# Patient Record
Sex: Male | Born: 1950
Health system: Southern US, Community
[De-identification: ages and names within clinical notes are randomized; demographics above are authoritative.]

## PROBLEM LIST (undated history)

## (undated) DIAGNOSIS — I350 Nonrheumatic aortic (valve) stenosis: Secondary | ICD-10-CM

## (undated) DIAGNOSIS — K2270 Barrett's esophagus without dysplasia: Secondary | ICD-10-CM

## (undated) DIAGNOSIS — I739 Peripheral vascular disease, unspecified: Secondary | ICD-10-CM

## (undated) DIAGNOSIS — K219 Gastro-esophageal reflux disease without esophagitis: Secondary | ICD-10-CM

## (undated) DIAGNOSIS — Z8719 Personal history of other diseases of the digestive system: Secondary | ICD-10-CM

## (undated) DIAGNOSIS — Z953 Presence of xenogenic heart valve: Secondary | ICD-10-CM

## (undated) DIAGNOSIS — Z9861 Coronary angioplasty status: Secondary | ICD-10-CM

## (undated) DIAGNOSIS — M329 Systemic lupus erythematosus, unspecified: Secondary | ICD-10-CM

## (undated) DIAGNOSIS — J189 Pneumonia, unspecified organism: Secondary | ICD-10-CM

## (undated) DIAGNOSIS — J984 Other disorders of lung: Secondary | ICD-10-CM

## (undated) DIAGNOSIS — I10 Essential (primary) hypertension: Secondary | ICD-10-CM

## (undated) DIAGNOSIS — IMO0002 Reserved for concepts with insufficient information to code with codable children: Secondary | ICD-10-CM

## (undated) DIAGNOSIS — I25709 Atherosclerosis of coronary artery bypass graft(s), unspecified, with unspecified angina pectoris: Secondary | ICD-10-CM

## (undated) DIAGNOSIS — Z951 Presence of aortocoronary bypass graft: Secondary | ICD-10-CM

## (undated) DIAGNOSIS — I2510 Atherosclerotic heart disease of native coronary artery without angina pectoris: Secondary | ICD-10-CM

## (undated) DIAGNOSIS — E785 Hyperlipidemia, unspecified: Secondary | ICD-10-CM

## (undated) DIAGNOSIS — Z974 Presence of external hearing-aid: Secondary | ICD-10-CM

## (undated) DIAGNOSIS — D649 Anemia, unspecified: Secondary | ICD-10-CM

## (undated) DIAGNOSIS — H409 Unspecified glaucoma: Secondary | ICD-10-CM

## (undated) DIAGNOSIS — D6859 Other primary thrombophilia: Secondary | ICD-10-CM

## (undated) HISTORY — DX: Barrett's esophagus without dysplasia: K22.70

## (undated) HISTORY — PX: TONSILLECTOMY AND ADENOIDECTOMY: SUR1326

## (undated) HISTORY — DX: Other disorders of lung: J98.4

## (undated) HISTORY — PX: EYE SURGERY: SHX253

## (undated) HISTORY — DX: Pneumonia, unspecified organism: J18.9

## (undated) HISTORY — DX: Presence of external hearing-aid: Z97.4

## (undated) HISTORY — DX: Reserved for concepts with insufficient information to code with codable children: IMO0002

## (undated) HISTORY — PX: KNEE SURGERY: SHX244

## (undated) HISTORY — DX: Systemic lupus erythematosus, unspecified: M32.9

## (undated) HISTORY — DX: Gastro-esophageal reflux disease without esophagitis: K21.9

## (undated) HISTORY — DX: Hyperlipidemia, unspecified: E78.5

## (undated) HISTORY — DX: Unspecified glaucoma: H40.9

## (undated) HISTORY — PX: CATARACT EXTRACTION: SUR2

## (undated) HISTORY — DX: Other primary thrombophilia: D68.59

## (undated) HISTORY — PX: UPPER GASTROINTESTINAL ENDOSCOPY: SHX188

## (undated) HISTORY — PX: COLONOSCOPY: SHX174

## (undated) HISTORY — DX: Nonrheumatic aortic (valve) stenosis: I35.0

## (undated) HISTORY — PX: NASAL SINUS SURGERY: SHX719

---

## 2014-01-14 ENCOUNTER — Encounter: Payer: Self-pay | Admitting: Family

## 2014-01-14 ENCOUNTER — Ambulatory Visit (INDEPENDENT_AMBULATORY_CARE_PROVIDER_SITE_OTHER): Payer: Medicare Other | Admitting: Family

## 2014-01-14 VITALS — BP 140/84 | HR 81 | Ht 68.5 in | Wt 165.0 lb

## 2014-01-14 DIAGNOSIS — M1990 Unspecified osteoarthritis, unspecified site: Secondary | ICD-10-CM

## 2014-01-14 DIAGNOSIS — K219 Gastro-esophageal reflux disease without esophagitis: Secondary | ICD-10-CM | POA: Insufficient documentation

## 2014-01-14 DIAGNOSIS — I10 Essential (primary) hypertension: Secondary | ICD-10-CM

## 2014-01-14 DIAGNOSIS — D6859 Other primary thrombophilia: Secondary | ICD-10-CM

## 2014-01-14 DIAGNOSIS — D6862 Lupus anticoagulant syndrome: Secondary | ICD-10-CM | POA: Insufficient documentation

## 2014-01-14 DIAGNOSIS — Z9861 Coronary angioplasty status: Secondary | ICD-10-CM

## 2014-01-14 DIAGNOSIS — R0602 Shortness of breath: Secondary | ICD-10-CM

## 2014-01-14 DIAGNOSIS — E7800 Pure hypercholesterolemia, unspecified: Secondary | ICD-10-CM

## 2014-01-14 DIAGNOSIS — H409 Unspecified glaucoma: Secondary | ICD-10-CM

## 2014-01-14 DIAGNOSIS — I2510 Atherosclerotic heart disease of native coronary artery without angina pectoris: Secondary | ICD-10-CM | POA: Insufficient documentation

## 2014-01-14 DIAGNOSIS — M2550 Pain in unspecified joint: Secondary | ICD-10-CM

## 2014-01-14 DIAGNOSIS — J309 Allergic rhinitis, unspecified: Secondary | ICD-10-CM | POA: Insufficient documentation

## 2014-01-14 DIAGNOSIS — E785 Hyperlipidemia, unspecified: Secondary | ICD-10-CM | POA: Insufficient documentation

## 2014-01-14 LAB — POCT INR: INR: 3.3

## 2014-01-14 NOTE — Progress Notes (Signed)
Pre visit review using our clinic review tool, if applicable. No additional management support is needed unless otherwise documented below in the visit note. 

## 2014-01-14 NOTE — Progress Notes (Signed)
Subjective:    Patient ID: [name], male    DOB: October 20, 1950, 63 y.o.   MRN: [id number]  HPI 63 year old WM, new patient is in to be established. He has a history of lupus anticoagulant syndrome, hypercholesterolemia, coronary artery disease, multiple joint pain, osteoarthritis, glaucoma, GERD, and allergic rhinitis. He relocated here from [facility]. We'll need to reestablish with cardiology, pulmonology, rheumatology, and ophthalmology. He is currently on Coumadin for lupus hypercoagulability. Takes 4 mg of Coumadin on Tuesdays and Thursdays and 5 mg all other days. Last labs were approximately 1 month ago and requests that we get his medical records from previous PCP. Has a history of colon polyps last colonoscopy 5 years ago. Reports polyps being benign. No active complaints. Will be on a cruise from April 11th-May 12th.    Review of Systems  Constitutional: Negative.   HENT: Negative.   Respiratory: Negative.   Cardiovascular: Negative.   Gastrointestinal: Negative.   Endocrine: Negative.   Genitourinary: Negative.   Musculoskeletal: Negative.   Skin: Negative.   Neurological: Negative.   Hematological: Negative.   Psychiatric/Behavioral: Negative.    Past Medical History  Diagnosis Date  . GERD (gastroesophageal reflux disease)   . Glaucoma   . Hypertension   . Hyperlipidemia   . Heart disease   . Lupus   . Heart murmur   . Pneumonia     History   Social History  . Marital Status: Married    Spouse Name: N/A    Number of Children: N/A  . Years of Education: N/A   Occupational History  . Not on file.   Social History Main Topics  . Smoking status: Never Smoker   . Smokeless tobacco: Not on file  . Alcohol Use: Yes  . Drug Use: No  . Sexual Activity: Not on file   Other Topics Concern  . Not on file   Social History Narrative  . No narrative on file    Past Surgical History  Procedure Laterality Date  . Tonsillectomy and adenoidectomy     . Knee surgery      bilateral  . Cataract extraction      x 2    Family History  Problem Relation Age of Onset  . Diabetes Mother   . Hypertension Mother   . Arthritis Mother   . Diabetes Father   . Hypertension Father   . Arthritis Father     Not on File  No current outpatient prescriptions on file prior to visit.   No current facility-administered medications on file prior to visit.    BP 140/84  Pulse 81  Ht 5' 8.5" (1.74 m)  Wt 165 lb (74.844 kg)  BMI 24.72 kg/m2  SpO2 98%chart    Objective:   Physical Exam  Constitutional: He is oriented to person, place, and time. He appears well-developed and well-nourished.  HENT:  Head: Normocephalic.  Right Ear: External ear normal.  Left Ear: External ear normal.  Nose: Nose normal.  Mouth/Throat: Oropharynx is clear and moist.  Neck: Normal range of motion. Neck supple.  Cardiovascular: Normal rate, regular rhythm and normal heart sounds.   Pulmonary/Chest: Effort normal and breath sounds normal.  Abdominal: Soft. Bowel sounds are normal.  Musculoskeletal: Normal range of motion.  Neurological: He is alert and oriented to person, place, and time.  Skin: Skin is warm and dry.  Psychiatric: He has a normal mood and affect.  Assessment & Plan:  [name] was seen today for establish care.  Diagnoses and associated orders for this visit:  Lupus anticoagulant with hypercoagulable state - Ambulatory referral to Pulmonology - Ambulatory referral to Rheumatology  CAD (coronary artery disease) - Ambulatory referral to Cardiology  Multiple joint pain  Osteoarthritis  Glaucoma - Ambulatory referral to Ophthalmology  GERD (gastroesophageal reflux disease)  Allergic rhinitis  Shortness of breath - Ambulatory referral to Pulmonology  Other Orders - POCT INR   Obtain medical records from previous PCP.

## 2014-01-14 NOTE — Patient Instructions (Signed)
Take 1/2 tablet today only. Then continue 4 mg on Tues and Thursday. All other days 5mg .   Anticoagulation Dose Instructions as of 01/14/2014     Glynis SmilesSun Mon Tue Wed Thu Fri Sat   New Dose 5 mg 5 mg 4 mg 5 mg 4 mg 5 mg 5 mg    Description       Take 1/2 tablet today only. Then continue 4 mg on Tues and Thursday. All other days 5mg .

## 2014-01-18 ENCOUNTER — Telehealth: Payer: Self-pay | Admitting: Family

## 2014-01-18 ENCOUNTER — Other Ambulatory Visit: Payer: Self-pay

## 2014-01-18 DIAGNOSIS — I2510 Atherosclerotic heart disease of native coronary artery without angina pectoris: Secondary | ICD-10-CM

## 2014-01-18 DIAGNOSIS — I10 Essential (primary) hypertension: Secondary | ICD-10-CM

## 2014-01-18 MED ORDER — WARFARIN SODIUM 5 MG PO TABS: 5.0000 mg | ORAL_TABLET | Freq: Every day | ORAL | Status: DC

## 2014-01-18 NOTE — Telephone Encounter (Signed)
Relevant patient education assigned to patient using Emmi. ° °

## 2014-01-19 ENCOUNTER — Telehealth: Payer: Self-pay | Admitting: Family

## 2014-01-19 NOTE — Telephone Encounter (Signed)
Dr[facility] Corliss SkainsEVESHWAR[facility] to [facility] due to scheduling.  The doctor request pt to be referred to another rheumatologist.

## 2014-01-19 NOTE — Telephone Encounter (Signed)
Can referral be sent to another rheumatologist without referral being placed again?

## 2014-01-19 NOTE — Telephone Encounter (Signed)
Yes i will send to Dr Kellie Simmeringruslow

## 2014-01-19 NOTE — Telephone Encounter (Signed)
Thank you :)

## 2014-01-20 ENCOUNTER — Ambulatory Visit (INDEPENDENT_AMBULATORY_CARE_PROVIDER_SITE_OTHER): Payer: Medicare Other | Admitting: Pulmonary Disease

## 2014-01-20 ENCOUNTER — Encounter: Payer: Self-pay | Admitting: Pulmonary Disease

## 2014-01-20 VITALS — BP 142/82 | HR 68 | Ht 68.5 in | Wt 166.0 lb

## 2014-01-20 DIAGNOSIS — R0609 Other forms of dyspnea: Secondary | ICD-10-CM

## 2014-01-20 DIAGNOSIS — D6859 Other primary thrombophilia: Secondary | ICD-10-CM

## 2014-01-20 DIAGNOSIS — R0600 Dyspnea, unspecified: Secondary | ICD-10-CM | POA: Insufficient documentation

## 2014-01-20 DIAGNOSIS — J99 Respiratory disorders in diseases classified elsewhere: Secondary | ICD-10-CM

## 2014-01-20 DIAGNOSIS — D6862 Lupus anticoagulant syndrome: Secondary | ICD-10-CM

## 2014-01-20 DIAGNOSIS — J189 Pneumonia, unspecified organism: Secondary | ICD-10-CM

## 2014-01-20 DIAGNOSIS — M329 Systemic lupus erythematosus, unspecified: Secondary | ICD-10-CM

## 2014-01-20 DIAGNOSIS — R0989 Other specified symptoms and signs involving the circulatory and respiratory systems: Secondary | ICD-10-CM

## 2014-01-20 DIAGNOSIS — M3213 Lung involvement in systemic lupus erythematosus: Secondary | ICD-10-CM

## 2014-01-20 NOTE — Assessment & Plan Note (Signed)
At this point I think that this is more likely to be a cardiac problem than a pulmonary problem considering his normal lung exam and oximetry.  Further, his cardiac history is much more complicated than his documented pulmonary problems.  Plan: -PFT -establish care with cardiology

## 2014-01-20 NOTE — Assessment & Plan Note (Addendum)
Apparently he has been told that he has lupus pneumonitis but it has been quiescent for years and has never flared.  It's not clear to me how this diagnosis was made as he never really recalls having pulmonary symptoms.   Fortunately the problem does not appear to be worsening.  Plan: -[facility] -check Full PFT -CXR -if PFT abnormal, check CT chest -continue plaquenil -f/u 6 months

## 2014-01-20 NOTE — Patient Instructions (Signed)
Continue taking your Plaquenil as you are doing We will arrange a lung function test and a Chest X-ray I recommend that you [facility] for [facility] Rheumatology visit, I can give a referral if you need it We will see you back in 6 months or sooner if needed

## 2014-01-20 NOTE — Progress Notes (Signed)
Subjective:    Patient ID: [name], male    DOB: Jun 16, 1951, 63 y.o.   MRN: [id number]  HPI  This is a very pleasant 63 year old male who comes her clinic today to establish care for lupus-related pneumonitis. He was diagnosed with lupus over 10 years ago [place] [address] [place] He has been followed by a rheumatologist in his previous hometown as well as [facility].  Lupus has caused significant skin changes and joint aches over the years. He has not had significant kidney damage. He has been told that the lining of his lung and his alveoli are thicker than they should be because of the lupus. However despite this he has never been hospitalized and he has never had a flare of worsening shortness of breath or cough. He says that he does get some shortness of breath when he exerts himself though he tries to stay as active as possible. He actually lifts weights on a regular basis and sometimes has some shortness of breath with this. He also does cardiovascular activity when he works out such as walking on a treadmill. He will have mild shortness of breath with this but it is not too bad. He says that overall he thinks the shortness of breath has perhaps worsened somewhat over the last 10 years but it has not been a rapid decline. Because of his multiple cardiac events in the past (he has had several cardiac stents) he is unsure of his shortness of breath has been due to his heart problems are his lung problems.  [facility] for this problem in the past. His lung function testing has been stable over the last several years according to their notes. Previously he took prednisone for many years but currently he has been managed on plaque. He says that he has taken plaque now alone for the last 2 years.  Past Medical History  Diagnosis Date  . GERD (gastroesophageal reflux disease)   . Glaucoma   . Hypertension   . Hyperlipidemia   . Heart disease    . Lupus   . Heart murmur   . Pneumonia      Family History  Problem Relation Age of Onset  . Diabetes Mother   . Hypertension Mother   . Arthritis Mother   . Diabetes Father   . Hypertension Father   . Arthritis Father      History   Social History  . Marital Status: Married    Spouse Name: N/A    Number of Children: N/A  . Years of Education: N/A   Occupational History  . Not on file.   Social History Main Topics  . Smoking status: Never Smoker   . Smokeless tobacco: Never Used  . Alcohol Use: 3.5 oz/week    7 drink(s) per week  . Drug Use: No  . Sexual Activity: Not on file   Other Topics Concern  . Not on file   Social History Narrative  . No narrative on file     Allergies  Allergen Reactions  . Sulfa Antibiotics     Makes patient jittery, prefers not to take them.      Outpatient Prescriptions Prior to Visit  Medication Sig Dispense Refill  . CRESTOR 5 MG tablet Take 5 mg by mouth daily.       . hydroxychloroquine (PLAQUENIL) 200 MG tablet Take 400 mg by mouth daily.       Marland Kitchen. NASONEX  50 MCG/ACT nasal spray Place 2 sprays into the nose daily.       Marland Kitchen omeprazole (PRILOSEC) 40 MG capsule Take 40 mg by mouth daily.       . ramipril (ALTACE) 5 MG capsule Take 5 mg by mouth daily.       Marland Kitchen RANEXA 500 MG 12 hr tablet Take 500 mg by mouth 2 (two) times daily.       Marland Kitchen warfarin (COUMADIN) 4 MG tablet Take 4 mg by mouth daily. Tuesdays and Thursdays      . warfarin (COUMADIN) 5 MG tablet Take 1 tablet (5 mg total) by mouth daily.  30 tablet  0   No facility-administered medications prior to visit.      Review of Systems  Constitutional: Negative for fever and unexpected weight change.  HENT: Positive for ear pain. Negative for congestion, dental problem, nosebleeds, postnasal drip, rhinorrhea, sinus pressure, sneezing, sore throat and trouble swallowing.   Eyes: Negative for redness and itching.  Respiratory: Positive for shortness of breath. Negative for  cough, chest tightness and wheezing.   Cardiovascular: Positive for palpitations. Negative for leg swelling.  Gastrointestinal: Negative for nausea and vomiting.  Genitourinary: Negative for dysuria.  Musculoskeletal: Positive for joint swelling.  Skin: Negative for rash.  Neurological: Negative for headaches.  Hematological: Does not bruise/bleed easily.  Psychiatric/Behavioral: Negative for dysphoric mood. The patient is not nervous/anxious.        Objective:   Physical Exam  Filed Vitals:   01/20/14 1431  BP: 142/82  Pulse: 68  Height: 5' 8.5" (1.74 m)  Weight: 166 lb (75.297 kg)  SpO2: 99%   Gen: well appearing, no acute distress HEENT: NCAT, PERRL, EOMi, OP clear, neck supple without masses PULM: Few crackles in bases, otherwise clear CV: RRR, two systolic murmurs noted RUSB and apex, no JVD AB: BS+, soft, nontender, no hsm Ext: warm, no edema, no clubbing, no cyanosis Derm: multiple skin lesions throughout forearms, nose Neuro: A&Ox4, CN II-XII intact, strength 5/5 in all 4 extremities  Notes from [facility] reviewed: -2014 spirometry: Ratio 70%, FVC 3.47 L (76% pred), FEV1 2.4L (71% pred) -2015 Echo > LVEF 50-59%, normal size and functioning LV; moderate AS, mild AI, mild MR, RV normal size and function, RA mildly dilated, no pericardial effusion    Assessment & Plan:   Lupus anticoagulant with hypercoagulable state I don't have record of his lab work to confirm antiphospolipid antibody syndrome. However, I think it is very reasonable to continue to treat him as such since he had PE in the past and was told that his "blood work said it was from Lupus".  His new Rheumatologist may want to consider re-testing, if there is value in that this many years into treatment.  Lung involvement in systemic lupus erythematosus Apparently he has been told that he has lupus pneumonitis but it has been quiescent for years and has never flared.  It's not clear to me how this diagnosis was  made as he never really recalls having pulmonary symptoms.   Fortunately the problem does not appear to be worsening.  Plan: -obtain records from [facility] -check Full PFT -CXR -if PFT abnormal, check CT chest -continue plaquenil -f/u 6 months  Dyspnea At this point I think that this is more likely to be a cardiac problem than a pulmonary problem considering his normal lung exam and oximetry.  Further, his cardiac history is much more complicated than his documented pulmonary problems.  Plan: -PFT -establish care  with cardiology    Updated Medication List Outpatient Encounter Prescriptions as of 01/20/2014  Medication Sig  . CRESTOR 5 MG tablet Take 5 mg by mouth daily.   . hydroxychloroquine (PLAQUENIL) 200 MG tablet Take 400 mg by mouth daily.   Marland Kitchen NASONEX 50 MCG/ACT nasal spray Place 2 sprays into the nose daily.   Marland Kitchen omeprazole (PRILOSEC) 40 MG capsule Take 40 mg by mouth daily.   . ramipril (ALTACE) 5 MG capsule Take 5 mg by mouth daily.   Marland Kitchen RANEXA 500 MG 12 hr tablet Take 500 mg by mouth 2 (two) times daily.   Marland Kitchen warfarin (COUMADIN) 4 MG tablet Take 4 mg by mouth daily. Tuesdays and Thursdays  . warfarin (COUMADIN) 5 MG tablet Take 5 mg by mouth daily. M,W,F,S,S  . [DISCONTINUED] warfarin (COUMADIN) 5 MG tablet Take 1 tablet (5 mg total) by mouth daily.

## 2014-01-20 NOTE — Assessment & Plan Note (Signed)
I don't have record of his lab work to confirm antiphospolipid antibody syndrome. However, I think it is very reasonable to continue to treat him as such since he had PE in the past and was told that his "blood work said it was from Lupus".  His new Rheumatologist may want to consider re-testing, if there is value in that this many years into treatment.

## 2014-01-27 ENCOUNTER — Other Ambulatory Visit (HOSPITAL_COMMUNITY): Payer: Medicare Other

## 2014-02-02 ENCOUNTER — Ambulatory Visit: Payer: Medicare Other | Admitting: Cardiology

## 2014-02-24 ENCOUNTER — Ambulatory Visit (INDEPENDENT_AMBULATORY_CARE_PROVIDER_SITE_OTHER): Payer: Medicare Other | Admitting: Family

## 2014-02-24 DIAGNOSIS — D6862 Lupus anticoagulant syndrome: Secondary | ICD-10-CM

## 2014-02-24 DIAGNOSIS — D6859 Other primary thrombophilia: Secondary | ICD-10-CM

## 2014-02-24 LAB — POCT INR: INR: 2.8

## 2014-02-24 MED ORDER — WARFARIN SODIUM 5 MG PO TABS: 5.0000 mg | ORAL_TABLET | Freq: Every day | ORAL | Status: DC

## 2014-02-24 NOTE — Patient Instructions (Signed)
Continue 4 mg on Tues and Thursday. All other days 5mg . Recheck in 4 weeks.   Anticoagulation Dose Instructions as of 02/24/2014     Kenneth SmilesSun Mon Tue Wed Thu Fri Sat   New Dose 5 mg 5 mg 4 mg 5 mg 4 mg 5 mg 5 mg    Description       Continue 4 mg on Tues and Thursday. All other days 5mg . Recheck in 4 weeks.

## 2014-03-01 ENCOUNTER — Other Ambulatory Visit: Payer: Self-pay

## 2014-03-01 ENCOUNTER — Ambulatory Visit (HOSPITAL_COMMUNITY): Payer: Medicare Other | Attending: Cardiovascular Disease | Admitting: Cardiology

## 2014-03-01 DIAGNOSIS — I2510 Atherosclerotic heart disease of native coronary artery without angina pectoris: Secondary | ICD-10-CM

## 2014-03-01 DIAGNOSIS — R0602 Shortness of breath: Secondary | ICD-10-CM

## 2014-03-01 DIAGNOSIS — I4891 Unspecified atrial fibrillation: Secondary | ICD-10-CM

## 2014-03-01 NOTE — Progress Notes (Signed)
Echo performed. 

## 2014-03-03 ENCOUNTER — Ambulatory Visit (INDEPENDENT_AMBULATORY_CARE_PROVIDER_SITE_OTHER): Payer: Medicare Other | Admitting: Cardiology

## 2014-03-03 ENCOUNTER — Encounter: Payer: Self-pay | Admitting: Cardiology

## 2014-03-03 VITALS — BP 138/70 | HR 68 | Ht 68.5 in | Wt 166.3 lb

## 2014-03-03 DIAGNOSIS — R0989 Other specified symptoms and signs involving the circulatory and respiratory systems: Secondary | ICD-10-CM

## 2014-03-03 DIAGNOSIS — I2510 Atherosclerotic heart disease of native coronary artery without angina pectoris: Secondary | ICD-10-CM

## 2014-03-03 DIAGNOSIS — E7800 Pure hypercholesterolemia, unspecified: Secondary | ICD-10-CM

## 2014-03-03 DIAGNOSIS — R5383 Other fatigue: Secondary | ICD-10-CM

## 2014-03-03 DIAGNOSIS — R0609 Other forms of dyspnea: Secondary | ICD-10-CM

## 2014-03-03 DIAGNOSIS — Z79899 Other long term (current) drug therapy: Secondary | ICD-10-CM

## 2014-03-03 DIAGNOSIS — Z131 Encounter for screening for diabetes mellitus: Secondary | ICD-10-CM

## 2014-03-03 DIAGNOSIS — R0600 Dyspnea, unspecified: Secondary | ICD-10-CM

## 2014-03-03 DIAGNOSIS — R5381 Other malaise: Secondary | ICD-10-CM

## 2014-03-03 NOTE — Progress Notes (Signed)
HPI The patient presents as a new patient. He has an extensive history of coronary disease.  He is being evaluated for dyspnea. He does have some lung disease felt to be related to his lupus. He has an extensive history of coronary disease and does report 7 stents in the past. I do have some of these records but not the most recent catheterization. I do see a stress perfusion study from January done at another institution. There was not any evidence of ischemia or infarct. He's had a well preserved ejection fraction. He did have an echo recently demonstrated well-preserved ejection fraction and mild aortic stenosis. He actually has done well. He says that any dyspnea he has is really baseline. He does exercise routinely and was able to do about 100 minutes yesterday at exercise without bringing on any of his usual angina. He's not having any chest pressure, neck or arm discomfort. He's not having any palpitations, presyncope or syncope. He has no PND or orthopnea. He is having pulmonary workup pulmonary function testing as scheduled.   Allergies  Allergen Reactions  . Sulfa Antibiotics     Makes patient jittery, prefers not to take them.     Current Outpatient Prescriptions  Medication Sig Dispense Refill  . aspirin EC 325 MG tablet Take 0.5 tablets by mouth daily.      . CRESTOR 5 MG tablet Take 5 mg by mouth daily.       . hydroxychloroquine (PLAQUENIL) 200 MG tablet Take 400 mg by mouth daily.       . metoprolol succinate (TOPROL-XL) 25 MG 24 hr tablet Take 0.5 tablets by mouth 2 (two) times daily.      Marland Kitchen. NASONEX 50 MCG/ACT nasal spray Place 2 sprays into the nose daily.       Marland Kitchen. omeprazole (PRILOSEC) 40 MG capsule Take 40 mg by mouth daily.       . ramipril (ALTACE) 5 MG capsule Take 5 mg by mouth daily.       Marland Kitchen. RANEXA 500 MG 12 hr tablet Take 500 mg by mouth 2 (two) times daily.       Marland Kitchen. warfarin (COUMADIN) 4 MG tablet Take 4 mg by mouth daily. Tuesdays and Thursdays      . warfarin  (COUMADIN) 5 MG tablet Take 1 tablet (5 mg total) by mouth daily. M,W,F,S,S  90 tablet  0   No current facility-administered medications for this visit.    Past Medical History  Diagnosis Date  . GERD (gastroesophageal reflux disease)   . Glaucoma   . Hypertension   . Hyperlipidemia   . Heart disease   . Lupus   . Heart murmur   . Pneumonia     Past Surgical History  Procedure Laterality Date  . Tonsillectomy and adenoidectomy    . Knee surgery      bilateral  . Cataract extraction      x 2    Family History  Problem Relation Age of Onset  . Diabetes Mother   . Hypertension Mother   . Arthritis Mother   . Diabetes Father   . Hypertension Father   . Arthritis Father     History   Social History  . Marital Status: Married    Spouse Name: N/A    Number of Children: N/A  . Years of Education: N/A   Occupational History  . Not on file.   Social History Main Topics  . Smoking status: Never Smoker   .  Smokeless tobacco: Never Used  . Alcohol Use: 3.5 oz/week    7 drink(s) per week  . Drug Use: No  . Sexual Activity: Not on file   Other Topics Concern  . Not on file   Social History Narrative  . No narrative on file    ROS:  Decreased hearing, reflux. Otherwise as stated in the HPI and negative for all other systems.  PHYSICAL EXAM BP 138/70  Pulse 68  Ht 5' 8.5" (1.74 m)  Wt 166 lb 4.8 oz (75.433 kg)  BMI 24.92 kg/m2 GENERAL:  Well appearing HEENT:  Pupils equal round and reactive, fundi not visualized, oral mucosa unremarkable NECK:  No jugular venous distention, waveform within normal limits, carotid upstroke brisk and symmetric, possible left bruit, no thyromegaly LYMPHATICS:  No cervical, inguinal adenopathy LUNGS:  Clear to auscultation bilaterally BACK:  No CVA tenderness CHEST:  Unremarkable HEART:  PMI not displaced or sustained,S1 and S2 within normal limits, no S3, no S4, no clicks, no rubs, apical systolic murmur at the apex and  slightly radiating, no diasotlic murmurs ABD:  Flat, positive bowel sounds normal in frequency in pitch, no bruits, no rebound, no guarding, no midline pulsatile mass, no hepatomegaly, no splenomegaly EXT:  2 plus pulses left radial, decreased right radial, no edema, no cyanosis no clubbing, discoloration on the legs.   SKIN:  No rashes no nodules NEURO:  Cranial nerves II through XII grossly intact, motor grossly intact throughout PSYCH:  Cognitively intact, oriented to person place and time  EKG:  Sinus rhythm, rate 68, axis within normal limits, intervals within normal limits, no acute ST-T wave changes.  03/03/2014   ASSESSMENT AND PLAN  CAD:  The patient had a negative stress perfusion study in January. If symptoms. I will obtain his most recent cardiac catheterization. However, for now he needs no new cardiovascular testing but he does need continued risk reduction.  HTN:  His blood pressure is controlled. We will continue the meds as listed.  DYSLIPIDEMIA:  I will check a lipid profile and manage accordingly.  BRUIT:  I will check carotid Dopplers. This is likely a transmitted murmur however.  AS:  This is mild. No change in therapy is indicated. We will follow this clinically.  HYPERCOAGUABLE:  He will follow up with his primary provider for his INRs.  Review of previous records.   (Greater than 40 minutes reviewing all data with greater than 50% face to face with the patient).

## 2014-03-03 NOTE — Patient Instructions (Signed)
The current medical regimen is effective;  continue present plan and medications.  Please return fasting for blood work.  The [facility] lab is on the first floor of this building.  (CMP, TSH, CBC, HA1C and Lipid panel)  Your physician has requested that you have a carotid duplex. This test is an ultrasound of the carotid arteries in your neck. It looks at blood flow through these arteries that supply the brain with blood. Allow one hour for this exam. There are no restrictions or special instructions.  Follow up with Dr Antoine PocheHochrein in 3 months.

## 2014-03-05 ENCOUNTER — Other Ambulatory Visit: Payer: Self-pay | Admitting: *Deleted

## 2014-03-05 ENCOUNTER — Ambulatory Visit (HOSPITAL_COMMUNITY)
Admission: RE | Admit: 2014-03-05 | Discharge: 2014-03-05 | Disposition: A | Discharge status: Home / Self Care | Payer: Medicare Other | Source: Ambulatory Visit | Attending: Cardiovascular Disease | Admitting: Cardiovascular Disease

## 2014-03-05 DIAGNOSIS — R0989 Other specified symptoms and signs involving the circulatory and respiratory systems: Secondary | ICD-10-CM

## 2014-03-05 DIAGNOSIS — I2510 Atherosclerotic heart disease of native coronary artery without angina pectoris: Secondary | ICD-10-CM

## 2014-03-05 DIAGNOSIS — R5383 Other fatigue: Secondary | ICD-10-CM

## 2014-03-05 DIAGNOSIS — E7800 Pure hypercholesterolemia, unspecified: Secondary | ICD-10-CM

## 2014-03-05 DIAGNOSIS — Z131 Encounter for screening for diabetes mellitus: Secondary | ICD-10-CM

## 2014-03-05 DIAGNOSIS — R0600 Dyspnea, unspecified: Secondary | ICD-10-CM

## 2014-03-05 DIAGNOSIS — Z79899 Other long term (current) drug therapy: Secondary | ICD-10-CM

## 2014-03-05 LAB — CBC
HCT: 37.4 % — ABNORMAL LOW (ref 39.0–52.0)
Hemoglobin: 12.3 g/dL — ABNORMAL LOW (ref 13.0–17.0)
MCH: 26.2 pg (ref 26.0–34.0)
MCHC: 32.9 g/dL (ref 30.0–36.0)
MCV: 79.7 fL (ref 78.0–100.0)
PLATELETS: 195 10*3/uL (ref 150–400)
RBC: 4.69 MIL/uL (ref 4.22–5.81)
RDW: 16.4 % — ABNORMAL HIGH (ref 11.5–15.5)
WBC: 4.6 10*3/uL (ref 4.0–10.5)

## 2014-03-05 LAB — LIPID PANEL
Cholesterol: 85 mg/dL (ref 0–200)
HDL: 25 mg/dL — AB (ref >39)
LDL Cholesterol: 34 mg/dL (ref 0–99)
TRIGLYCERIDES: 131 mg/dL (ref <150)
Total CHOL/HDL Ratio: 3.4 Ratio
VLDL: 26 mg/dL (ref 0–40)

## 2014-03-05 LAB — COMPLETE METABOLIC PANEL WITH GFR
ALT: 19 U/L (ref 0–53)
AST: 25 U/L (ref 0–37)
Albumin: 4.2 g/dL (ref 3.5–5.2)
Alkaline Phosphatase: 24 U/L — ABNORMAL LOW (ref 39–117)
BUN: 13 mg/dL (ref 6–23)
CALCIUM: 9 mg/dL (ref 8.4–10.5)
CO2: 30 meq/L (ref 19–32)
CREATININE: 0.82 mg/dL (ref 0.50–1.35)
Chloride: 101 mEq/L (ref 96–112)
GFR, Est African American: >89 mL/min
GFR, Est Non African American: >89 mL/min
Glucose, Bld: 85 mg/dL (ref 70–99)
Potassium: 4.9 mEq/L (ref 3.5–5.3)
Sodium: 140 mEq/L (ref 135–145)
Total Bilirubin: 0.3 mg/dL (ref 0.2–1.2)
Total Protein: 6.9 g/dL (ref 6.0–8.3)

## 2014-03-05 NOTE — Progress Notes (Signed)
Carotid Duplex Completed. °Brianna L Mazza,RVT °

## 2014-03-06 LAB — HEMOGLOBIN A1C
Hgb A1c MFr Bld: 6 % — ABNORMAL HIGH (ref <5.7)
MEAN PLASMA GLUCOSE: 126 mg/dL — AB (ref <117)

## 2014-03-06 LAB — TSH: TSH: 4.033 u[IU]/mL (ref 0.350–4.500)

## 2014-03-25 ENCOUNTER — Other Ambulatory Visit: Payer: Self-pay | Admitting: Family

## 2014-03-25 ENCOUNTER — Ambulatory Visit (INDEPENDENT_AMBULATORY_CARE_PROVIDER_SITE_OTHER): Payer: Medicare Other | Admitting: General Practice

## 2014-03-25 ENCOUNTER — Telehealth: Payer: Self-pay

## 2014-03-25 DIAGNOSIS — L989 Disorder of the skin and subcutaneous tissue, unspecified: Secondary | ICD-10-CM

## 2014-03-25 DIAGNOSIS — Z5181 Encounter for therapeutic drug level monitoring: Secondary | ICD-10-CM

## 2014-03-25 DIAGNOSIS — D6859 Other primary thrombophilia: Secondary | ICD-10-CM

## 2014-03-25 DIAGNOSIS — D6862 Lupus anticoagulant syndrome: Secondary | ICD-10-CM

## 2014-03-25 LAB — POCT INR: INR: 3.6

## 2014-03-25 NOTE — Progress Notes (Signed)
Pre visit review using our clinic review tool, if applicable. No additional management support is needed unless otherwise documented below in the visit note. 

## 2014-03-25 NOTE — Telephone Encounter (Signed)
Pt needs skin cancer and growth on leg near groin area. Black spots on leg. Pt has mole that is getting bigger. Pt has medicare pri

## 2014-03-25 NOTE — Telephone Encounter (Signed)
Left message for pt to call back with [facility] for requesting dermatology referral. Advised pt to leave message on voicemail if I am unable to answer

## 2014-03-25 NOTE — Telephone Encounter (Signed)
See note from [facility] about dermatology referral

## 2014-04-22 ENCOUNTER — Ambulatory Visit (INDEPENDENT_AMBULATORY_CARE_PROVIDER_SITE_OTHER): Payer: Medicare Other | Admitting: General Practice

## 2014-04-22 DIAGNOSIS — Z5181 Encounter for therapeutic drug level monitoring: Secondary | ICD-10-CM

## 2014-04-22 DIAGNOSIS — D6862 Lupus anticoagulant syndrome: Secondary | ICD-10-CM

## 2014-04-22 DIAGNOSIS — D6859 Other primary thrombophilia: Secondary | ICD-10-CM

## 2014-04-22 LAB — POCT INR: INR: 3.1

## 2014-04-22 NOTE — Progress Notes (Signed)
Pre visit review using our clinic review tool, if applicable. No additional management support is needed unless otherwise documented below in the visit note. 

## 2014-05-17 ENCOUNTER — Telehealth: Payer: Self-pay | Admitting: Family

## 2014-05-17 MED ORDER — OMEPRAZOLE 40 MG PO CPDR: 40.0000 mg | DELAYED_RELEASE_CAPSULE | Freq: Every day | ORAL | Status: DC

## 2014-05-17 NOTE — Telephone Encounter (Signed)
Pt needs new rx omeprazole 40 mg #90 w/refills to [facility]

## 2014-05-17 NOTE — Telephone Encounter (Signed)
Done

## 2014-05-20 ENCOUNTER — Ambulatory Visit (INDEPENDENT_AMBULATORY_CARE_PROVIDER_SITE_OTHER): Payer: Medicare Other | Admitting: Family

## 2014-05-20 DIAGNOSIS — D6862 Lupus anticoagulant syndrome: Secondary | ICD-10-CM

## 2014-05-20 DIAGNOSIS — Z5181 Encounter for therapeutic drug level monitoring: Secondary | ICD-10-CM

## 2014-05-20 DIAGNOSIS — D6859 Other primary thrombophilia: Secondary | ICD-10-CM

## 2014-05-20 LAB — POCT INR: INR: 4.1

## 2014-05-20 NOTE — Patient Instructions (Signed)
Hold coumadin dose today (Thursday) nor tomorrow (Friday). Resume Saturday, take 5 mg all days except 4 mg on Tuesday/Friday.  Re-check in 2 weeks.  Anticoagulation Dose Instructions as of 05/20/2014     Kenneth SmilesSun Mon Tue Wed Thu Fri Sat   New Dose 5 mg 4 mg 5 mg 4 mg 5 mg 4 mg 5 mg    Description       Hold coumadin dose today (Thursday) nor tomorrow (Friday). Resume Saturday, take 5 mg all days except 4 mg on Tuesday/Friday.  Re-check in 2 weeks.

## 2014-06-02 ENCOUNTER — Ambulatory Visit (INDEPENDENT_AMBULATORY_CARE_PROVIDER_SITE_OTHER): Payer: Medicare Other | Admitting: Family

## 2014-06-02 DIAGNOSIS — D6862 Lupus anticoagulant syndrome: Secondary | ICD-10-CM

## 2014-06-02 DIAGNOSIS — Z5181 Encounter for therapeutic drug level monitoring: Secondary | ICD-10-CM

## 2014-06-02 DIAGNOSIS — D6859 Other primary thrombophilia: Secondary | ICD-10-CM

## 2014-06-02 LAB — POCT INR: INR: 3.2

## 2014-06-02 NOTE — Patient Instructions (Signed)
Take 1/2 tab today only.  Re-check in 3 weeks. 5mg  MWF and 4 mg all other days.   Anticoagulation Dose Instructions as of 06/02/2014     Glynis SmilesSun Mon Tue Wed Thu Fri Sat   New Dose 4 mg 5 mg 4 mg 5 mg 4 mg 5 mg 4 mg    Description       Take 1/2 tab today only.  Re-check in 3 weeks. 5mg  MWF and 4 mg all other days.

## 2014-06-03 ENCOUNTER — Telehealth: Payer: Self-pay | Admitting: Family

## 2014-06-03 MED ORDER — WARFARIN SODIUM 5 MG PO TABS: 5.0000 mg | ORAL_TABLET | Freq: Every day | ORAL | Status: DC

## 2014-06-03 NOTE — Telephone Encounter (Signed)
[facility] - [place], [place] - [address] is requesting 90 day re-fill on warfarin (COUMADIN) 5 MG tablet

## 2014-06-03 NOTE — Telephone Encounter (Signed)
Done

## 2014-06-04 ENCOUNTER — Ambulatory Visit (INDEPENDENT_AMBULATORY_CARE_PROVIDER_SITE_OTHER): Payer: Medicare Other | Admitting: Cardiology

## 2014-06-04 ENCOUNTER — Encounter: Payer: Self-pay | Admitting: Cardiology

## 2014-06-04 VITALS — BP 120/80 | HR 68 | Ht 68.0 in | Wt 167.1 lb

## 2014-06-04 DIAGNOSIS — E782 Mixed hyperlipidemia: Secondary | ICD-10-CM

## 2014-06-04 NOTE — Patient Instructions (Addendum)
Your physician recommends that you schedule a follow-up appointment in:  6 months with Dr. Antoine PocheHochrein  Decrease your Crestor to three times a week   Monday,  Wednesday and Friday   Lipids in six weeks

## 2014-06-04 NOTE — Progress Notes (Signed)
HPI The patient presents for follow up of a history of coronary disease.  He is being evaluated for dyspnea. He does have some lung disease felt to be related to his lupus. He has an extensive history of coronary disease and does report 7 stents in the past. He had a stress perfusion study in January at another institution. There was not any evidence of ischemia or infarct. He's had a well preserved ejection fraction. He did have an echo recently demonstrated well-preserved ejection fraction and mild aortic stenosis.   He actually has done well. He says that any dyspnea he has is really baseline. He does exercise routinely without bringing on any of his usual angina. He's not having any chest pressure, neck or arm discomfort. He's not having any palpitations, presyncope or syncope. He has no PND or orthopnea.   Allergies  Allergen Reactions  . Sulfa Antibiotics     Makes patient jittery, prefers not to take them.     Current Outpatient Prescriptions  Medication Sig Dispense Refill  . aspirin EC 325 MG tablet Take 0.5 tablets by mouth daily.      . CRESTOR 5 MG tablet Take 5 mg by mouth daily.       . hydroxychloroquine (PLAQUENIL) 200 MG tablet Take 200 mg by mouth.      . metoprolol succinate (TOPROL-XL) 25 MG 24 hr tablet Take 0.5 tablets by mouth 2 (two) times daily.      Marland Kitchen. NASONEX 50 MCG/ACT nasal spray Place 2 sprays into the nose daily.       Marland Kitchen. omeprazole (PRILOSEC) 40 MG capsule Take 1 capsule (40 mg total) by mouth daily.  90 capsule  0  . ramipril (ALTACE) 5 MG capsule Take 5 mg by mouth daily.       Marland Kitchen. RANEXA 500 MG 12 hr tablet Take 500 mg by mouth 2 (two) times daily.       Marland Kitchen. warfarin (COUMADIN) 4 MG tablet Take 4 mg by mouth daily. Tuesdays and Thursdays      . warfarin (COUMADIN) 5 MG tablet Take 1 tablet (5 mg total) by mouth daily. M,W,F,S,S  90 tablet  0   No current facility-administered medications for this visit.    Past Medical History  Diagnosis Date  . GERD  (gastroesophageal reflux disease)   . Glaucoma   . Hypertension   . Hyperlipidemia   . CAD (coronary artery disease)   . Lupus   . Heart murmur     Hypercoaguable  . Pneumonitis     Lupus    Past Surgical History  Procedure Laterality Date  . Tonsillectomy and adenoidectomy    . Knee surgery      bilateral  . Cataract extraction      x 2  . Nasal sinus surgery      ROS:  Knee pain.  Otherwise as stated in the HPI and negative for all other systems.  PHYSICAL EXAM BP 120/80  Pulse 68  Ht 5\' 8"  (1.727 m)  Wt 167 lb 2 oz (75.807 kg)  BMI 25.42 kg/m2 GENERAL:  Well appearing NECK:  No jugular venous distention, waveform within normal limits, carotid upstroke brisk and symmetric, possible left bruit, no thyromegaly LUNGS:  Clear to auscultation bilaterally BACK:  No CVA tenderness CHEST:  Unremarkable HEART:  PMI not displaced or sustained,S1 and S2 within normal limits, no S3, no S4, no clicks, no rubs, apical systolic murmur at the apex and slightly radiating, no diasotlic murmurs  ABD:  Flat, positive bowel sounds normal in frequency in pitch, no bruits, no rebound, no guarding, no midline pulsatile mass, no hepatomegaly, no splenomegaly EXT:  2 plus pulses left radial, decreased right radial, no edema, no cyanosis no clubbing, discoloration on the legs.     EKG:  Sinus rhythm, rate 68, axis within normal limits, intervals within normal limits, no acute ST-T wave changes.  06/04/2014   ASSESSMENT AND PLAN  CAD:  The patient had a negative stress perfusion study in January. The patient has no new sypmtoms.  No further cardiovascular testing is indicated.  We will continue with aggressive risk reduction and meds as listed.  HTN:  His blood pressure is controlled. We will continue the meds as listed.  DYSLIPIDEMIA:  I did check a lipid profile and the LD was 34.  He will reduce his Crestor to MWF  BRUIT:  He had only mild plaque.  No further work up is indicated.   AS:   This is mild. No change in therapy is indicated. We will follow this clinically.  HYPERCOAGUABLE:  He will follow up with his primary provider for his INRs.  I do not have the records about this condition and I suggested that he try to get those for [place].  He says that it was related to Lupus and he can discuss the continued indication with his rheumatologist.

## 2014-06-05 ENCOUNTER — Encounter: Payer: Self-pay | Admitting: Cardiology

## 2014-06-24 ENCOUNTER — Ambulatory Visit (INDEPENDENT_AMBULATORY_CARE_PROVIDER_SITE_OTHER): Payer: Medicare Other | Admitting: Family

## 2014-06-24 DIAGNOSIS — D6862 Lupus anticoagulant syndrome: Secondary | ICD-10-CM

## 2014-06-24 DIAGNOSIS — M25561 Pain in right knee: Secondary | ICD-10-CM

## 2014-06-24 DIAGNOSIS — D6859 Other primary thrombophilia: Secondary | ICD-10-CM

## 2014-06-24 DIAGNOSIS — M25569 Pain in unspecified knee: Secondary | ICD-10-CM

## 2014-06-24 DIAGNOSIS — Z5181 Encounter for therapeutic drug level monitoring: Secondary | ICD-10-CM

## 2014-06-24 LAB — POCT INR: INR: 3.1

## 2014-06-24 NOTE — Patient Instructions (Signed)
Decrease coumadin to  on Mondays and Wednesdays. All other days, 4 mgs. Recheck in 3 weeks.   Anticoagulation Dose Instructions as of 06/24/2014     Glynis Smiles Tue Wed Thu Fri Sat   New Dose 4 mg 5 mg 4 mg 5 mg 4 mg 4 mg 4 mg    Description       Decrease coumadin to  on Mondays and Wednesdays. All other days, 4 mgs. Recheck in 3 weeks.

## 2014-07-13 ENCOUNTER — Telehealth: Payer: Self-pay | Admitting: Family

## 2014-07-13 DIAGNOSIS — I10 Essential (primary) hypertension: Secondary | ICD-10-CM

## 2014-07-13 NOTE — Telephone Encounter (Signed)
[facility] - [place], [place] - [address] is requesting re-fills on the following: warfarin (COUMADIN) 4 MG tablet ramipril (ALTACE) 5 MG capsule

## 2014-07-14 ENCOUNTER — Ambulatory Visit (INDEPENDENT_AMBULATORY_CARE_PROVIDER_SITE_OTHER): Payer: Medicare Other | Admitting: Family

## 2014-07-14 DIAGNOSIS — D6862 Lupus anticoagulant syndrome: Secondary | ICD-10-CM

## 2014-07-14 DIAGNOSIS — D6859 Other primary thrombophilia: Secondary | ICD-10-CM

## 2014-07-14 DIAGNOSIS — Z5181 Encounter for therapeutic drug level monitoring: Secondary | ICD-10-CM

## 2014-07-14 LAB — POCT INR: INR: 3.2

## 2014-07-14 MED ORDER — WARFARIN SODIUM 4 MG PO TABS: ORAL_TABLET | ORAL | Status: DC

## 2014-07-14 MED ORDER — RAMIPRIL 5 MG PO CAPS: 5.0000 mg | ORAL_CAPSULE | Freq: Every day | ORAL | Status: DC

## 2014-07-14 NOTE — Telephone Encounter (Signed)
Done

## 2014-07-14 NOTE — Patient Instructions (Signed)
Decrease coumadin to 4 mg every day. Recheck in 3 weeks. Eat and extra serving of green veggies.   Anticoagulation Dose Instructions as of 07/14/2014     Kenneth SmilesSun Mon Tue Wed Thu Fri Sat   New Dose 4 mg 4 mg 4 mg 4 mg 4 mg 4 mg 4 mg    Description       Decrease coumadin to 4 mg every day. Recheck in 3 weeks. Eat and extra serving of green veggies.

## 2014-07-15 ENCOUNTER — Ambulatory Visit: Payer: Medicare Other

## 2014-07-20 LAB — LIPID PANEL
CHOLESTEROL: 115 mg/dL (ref 0–200)
HDL: 30 mg/dL — ABNORMAL LOW (ref >39)
LDL Cholesterol: 39 mg/dL (ref 0–99)
Total CHOL/HDL Ratio: 3.8 Ratio
Triglycerides: 230 mg/dL — ABNORMAL HIGH (ref <150)
VLDL: 46 mg/dL — AB (ref 0–40)

## 2014-08-02 ENCOUNTER — Ambulatory Visit (INDEPENDENT_AMBULATORY_CARE_PROVIDER_SITE_OTHER): Payer: Medicare Other | Admitting: Pulmonary Disease

## 2014-08-02 ENCOUNTER — Ambulatory Visit (INDEPENDENT_AMBULATORY_CARE_PROVIDER_SITE_OTHER)
Admission: RE | Admit: 2014-08-02 | Discharge: 2014-08-02 | Disposition: A | Discharge status: Home / Self Care | Payer: Medicare Other | Source: Ambulatory Visit | Attending: Pulmonary Disease | Admitting: Pulmonary Disease

## 2014-08-02 ENCOUNTER — Encounter: Payer: Self-pay | Admitting: Pulmonary Disease

## 2014-08-02 VITALS — BP 122/64 | HR 65 | Ht 69.0 in | Wt 170.0 lb

## 2014-08-02 DIAGNOSIS — J189 Pneumonia, unspecified organism: Secondary | ICD-10-CM

## 2014-08-02 DIAGNOSIS — R0600 Dyspnea, unspecified: Secondary | ICD-10-CM

## 2014-08-02 DIAGNOSIS — Z23 Encounter for immunization: Secondary | ICD-10-CM

## 2014-08-02 DIAGNOSIS — M3213 Lung involvement in systemic lupus erythematosus: Secondary | ICD-10-CM

## 2014-08-02 LAB — PULMONARY FUNCTION TEST
DL/VA % pred: 90 %
DL/VA: 4.11 ml/min/mmHg/L
DLCO UNC % PRED: 67 %
DLCO unc: 20.78 ml/min/mmHg
FEF 25-75 POST: 2.16 L/s
FEF 25-75 Pre: 1.54 L/sec
FEF2575-%CHANGE-POST: 40 %
FEF2575-%PRED-POST: 79 %
FEF2575-%Pred-Pre: 56 %
FEV1-%CHANGE-POST: 8 %
FEV1-%PRED-POST: 73 %
FEV1-%PRED-PRE: 67 %
FEV1-Post: 2.48 L
FEV1-Pre: 2.28 L
FEV1FVC-%Change-Post: 6 %
FEV1FVC-%Pred-Pre: 95 %
FEV6-%CHANGE-POST: 2 %
FEV6-%PRED-PRE: 73 %
FEV6-%Pred-Post: 75 %
FEV6-PRE: 3.14 L
FEV6-Post: 3.21 L
FEV6FVC-%Change-Post: 0 %
FEV6FVC-%PRED-PRE: 104 %
FEV6FVC-%Pred-Post: 104 %
FVC-%Change-Post: 2 %
FVC-%PRED-POST: 72 %
FVC-%Pred-Pre: 70 %
FVC-PRE: 3.16 L
FVC-Post: 3.25 L
POST FEV1/FVC RATIO: 76 %
PRE FEV6/FVC RATIO: 99 %
Post FEV6/FVC ratio: 99 %
Pre FEV1/FVC ratio: 72 %

## 2014-08-02 NOTE — Progress Notes (Signed)
PFT done today. 

## 2014-08-02 NOTE — Patient Instructions (Signed)
We will see you back in 6 months to see how you are doing If you feel more short of breath between now and the next visit let us know so we can do another lung function test

## 2014-08-02 NOTE — Assessment & Plan Note (Signed)
This has been a stable interval for [name]. He has not had significant change and shortness of breath since her last visit. Further, his pulmonary function testing has been unchanged since the last measurement at [facility] in 2014. At this point, I see very little evidence of true lung involvement from his systemic lupus erythematosus. He does have mild restrictive lung disease but there is no clear evidence of pulmonary parenchymal abnormalities considering the normal lung exam and normal oxygenation.  Plan: -Continue every 6 month followup visit -We will follow pulmonary function testing and chest x-rays annually. -Continue progressive exercise routine as he is doing

## 2014-08-02 NOTE — Progress Notes (Signed)
Subjective:    Patient ID: [name], male    DOB: [birth year], 63 y.o.   MRN: [id number]  Synopsis: Diagnosed with systemic lupus erythematosus by the [facility]ology Department years prior to his first visit to [facility] in 2015. He established care with the [place] [facility] after he relocated to [place]. Pulmonary function testing showed no significant abnormalities with the exception of mild restrictive lung disease and a mildly depressed DLCO.  HPI Chief Complaint  Patient presents with  . Follow-up    Review pft and cxr.  Pt c/o sob with heavy exertion, sinus congestion.     08/02/2014 ROV> Kenneth HuaDavid has been feeling OK since I saw him the last time.  He has not been having trouble breathing since the last visit.  He has been exercising regularly with weight lifting.  He sometimes will get shortness of breath with heavy exercise.  He has been walking at 25 minutes at a brisk pace with weights on his ankles and his back and he doesn't feel too back doing that.  He has not been coughing much.  He continues to take the plaquenil which he is now taking at one pill a day.  Apparently his blood work was normal.  He notes that a few months back he had a lot of pain all over which he attributed to his lupus.     Past Medical History  Diagnosis Date  . GERD (gastroesophageal reflux disease)   . Glaucoma   . Hypertension   . Hyperlipidemia   . CAD (coronary artery disease)     7 stents per the patient (no record).  Stent to 90% inferior branch of an OM and mild scattered plaque in other vessels [facility]  01/08/13  . Lupus   . AS (aortic stenosis)     Mild  . Pneumonitis     Lupus  . Hypercoagulable state       Review of Systems     Objective:   Physical Exam Filed Vitals:   08/02/14 1427  BP: 122/64  Pulse: 65  Height: 5\' 9"  (1.753 m)  Weight: 170 lb (77.111 kg)  SpO2: 98%  RA  Gen: well appearing, no acute distress HEENT: NCAT EOMi,OP  clear PULM: CTA B CV: RRR, no mgr, no JVD AB: BS+, soft, nontender,  Ext: warm, no edema, no clubbing, no cyanosis Derm: no rash or skin breakdown Neuro: A&Ox4, MAEW         Assessment & Plan:   Lung involvement in systemic lupus erythematosus This has been a stable interval for [name]. He has not had significant change and shortness of breath since her last visit. Further, his pulmonary function testing has been unchanged since the last measurement at [facility] in 2014. At this point, I see very little evidence of true lung involvement from his systemic lupus erythematosus. He does have mild restrictive lung disease but there is no clear evidence of pulmonary parenchymal abnormalities considering the normal lung exam and normal oxygenation.  Plan: -Continue every 6 month followup visit -We will follow pulmonary function testing and chest x-rays annually. -Continue progressive exercise routine as he is doing    Updated Medication List Outpatient Encounter Prescriptions as of 08/02/2014  Medication Sig  . aspirin EC 325 MG tablet Take 0.25 tablets by mouth daily.   . CRESTOR 5 MG tablet Take 5 mg by mouth daily. Only takes on M W F  . hydroxychloroquine (PLAQUENIL) 200 MG tablet Take  200 mg by mouth.  . metoprolol succinate (TOPROL-XL) 25 MG 24 hr tablet Take 0.5 tablets by mouth 2 (two) times daily.  Marland Kitchen. NASONEX 50 MCG/ACT nasal spray Place 2 sprays into the nose daily.   Marland Kitchen. omeprazole (PRILOSEC) 40 MG capsule Take 1 capsule (40 mg total) by mouth daily.  . ramipril (ALTACE) 5 MG capsule Take 1 capsule (5 mg total) by mouth daily.  Marland Kitchen. RANEXA 500 MG 12 hr tablet Take 500 mg by mouth 2 (two) times daily.   Marland Kitchen. warfarin (COUMADIN) 4 MG tablet Take 4 mg by mouth daily at 6 PM.  . [DISCONTINUED] warfarin (COUMADIN) 4 MG tablet Take 4mg  all days except Monday and Wednesday.  . warfarin (COUMADIN) 5 MG tablet Take 1 tablet (5 mg total) by mouth daily. M,W,F,S,S

## 2014-08-04 NOTE — Progress Notes (Signed)
Quick Note:  lmtcb X1 to relay results. ______ 

## 2014-08-05 ENCOUNTER — Telehealth: Payer: Self-pay | Admitting: Pulmonary Disease

## 2014-08-05 NOTE — Telephone Encounter (Signed)
Message copied by Velvet BatheAULFIELD, Tobie Perdue L on Thu Aug 05, 2014  2:04 PM ------      Message from: Lupita LeashMCQUAID, DOUGLAS B      Created: Mon Aug 02, 2014  9:54 PM       Morrie SheldonAshley,            Please let him know that his chest x-ray was okay            Thanks,      Kipp BroodBrent ------

## 2014-08-05 NOTE — Progress Notes (Signed)
Quick Note:  Pt aware of results. ______ 

## 2014-08-05 NOTE — Telephone Encounter (Signed)
Spoke with pt, he is aware of results.  Nothing further needed. 

## 2014-08-11 ENCOUNTER — Ambulatory Visit (INDEPENDENT_AMBULATORY_CARE_PROVIDER_SITE_OTHER): Payer: Medicare Other | Admitting: Family

## 2014-08-11 DIAGNOSIS — D6862 Lupus anticoagulant syndrome: Secondary | ICD-10-CM

## 2014-08-11 DIAGNOSIS — Z5181 Encounter for therapeutic drug level monitoring: Secondary | ICD-10-CM

## 2014-08-11 DIAGNOSIS — Z23 Encounter for immunization: Secondary | ICD-10-CM

## 2014-08-11 LAB — POCT INR: INR: 2.4

## 2014-08-11 NOTE — Patient Instructions (Signed)
4 mg every day. Recheck in 4 weeks.  Anticoagulation Dose Instructions as of 08/11/2014     [name]   New Dose 4 mg 4 mg 4 mg 4 mg 4 mg 4 mg 4 mg    Description       4 mg every day. Recheck in 4 weeks.

## 2014-08-11 NOTE — Addendum Note (Signed)
Addended by: Beverely LowFRAZIER, Toy Samarin L on: 08/11/2014 01:51 PM   Modules accepted: Orders

## 2014-08-25 ENCOUNTER — Telehealth: Payer: Self-pay | Admitting: Family

## 2014-08-25 NOTE — Telephone Encounter (Signed)
Can schedule to see someone here first. Oran Reinadonda is out until 09/06/14

## 2014-08-25 NOTE — Telephone Encounter (Signed)
Pt has been havibng a problem with gas for 3 months. Has changed his diet, tried probiotic etc all to no avail. He wants to know if he should see you or a specialist?

## 2014-08-25 NOTE — Telephone Encounter (Signed)
Called patient and scheduled with Dr Caryl NeverBurchette for 11/12 as he did not want to wait on Padondas return.

## 2014-08-26 ENCOUNTER — Encounter: Payer: Self-pay | Admitting: Family Medicine

## 2014-08-26 ENCOUNTER — Ambulatory Visit (INDEPENDENT_AMBULATORY_CARE_PROVIDER_SITE_OTHER): Payer: Medicare Other | Admitting: Family Medicine

## 2014-08-26 VITALS — BP 128/80 | HR 60 | Temp 97.4°F | Wt 168.0 lb

## 2014-08-26 DIAGNOSIS — K529 Noninfective gastroenteritis and colitis, unspecified: Secondary | ICD-10-CM

## 2014-08-26 LAB — CBC WITH DIFFERENTIAL/PLATELET
BASOS PCT: 0.6 % (ref 0.0–3.0)
Basophils Absolute: 0 10*3/uL (ref 0.0–0.1)
Eosinophils Absolute: 0.1 10*3/uL (ref 0.0–0.7)
Eosinophils Relative: 2.4 % (ref 0.0–5.0)
HEMATOCRIT: 39.3 % (ref 39.0–52.0)
Hemoglobin: 12.7 g/dL — ABNORMAL LOW (ref 13.0–17.0)
LYMPHS ABS: 2.6 10*3/uL (ref 0.7–4.0)
Lymphocytes Relative: 43.4 % (ref 12.0–46.0)
MCHC: 32.3 g/dL (ref 30.0–36.0)
MCV: 85.6 fl (ref 78.0–100.0)
Monocytes Absolute: 0.7 10*3/uL (ref 0.1–1.0)
Monocytes Relative: 10.9 % (ref 3.0–12.0)
NEUTROS ABS: 2.6 10*3/uL (ref 1.4–7.7)
Neutrophils Relative %: 42.7 % — ABNORMAL LOW (ref 43.0–77.0)
Platelets: 165 10*3/uL (ref 150.0–400.0)
RBC: 4.59 Mil/uL (ref 4.22–5.81)
RDW: 15.4 % (ref 11.5–15.5)
WBC: 6.1 10*3/uL (ref 4.0–10.5)

## 2014-08-26 NOTE — Progress Notes (Signed)
Pre visit review using our clinic review tool, if applicable. No additional management support is needed unless otherwise documented below in the visit note. 

## 2014-08-26 NOTE — Patient Instructions (Signed)

## 2014-08-26 NOTE — Progress Notes (Signed)
Subjective:    Patient ID: [name], male    DOB: [birth year], 63 y.o.   MRN: [id number]  HPI 63 year old male presents with 3 months of intermittent diarrhea. States that the episodes come and go with no clear trigger. Goes to the bathroom 4-5 times a day. No associated pain or fevers. 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 He notes that the diarrhea seems darker than other stools. No nausea  or vomiting. No changes in appetite or weight. Has been having constant passing of gas. No abdominal pain or distention. Both the gas and stools have a foul odor. No personal history of gluten intolerance or lactose intolerance. No history of Crohn's or ulcerative colitis. No family history of colon cancer. Last colonoscopy was several years ago.   Review of Systems  Constitutional: Negative for fever, chills, activity change, appetite change and fatigue.  Respiratory: Negative for cough and shortness of breath.   Cardiovascular: Negative for chest pain.  Gastrointestinal: Positive for diarrhea. Negative for nausea, vomiting, abdominal pain, constipation, blood in stool, abdominal distention and anal bleeding.  Genitourinary: Negative.   Neurological: Negative for dizziness, weakness, light-headedness, numbness and headaches.   Past Medical History  Diagnosis Date  . GERD (gastroesophageal reflux disease)   . Glaucoma   . Hypertension   . Hyperlipidemia   . CAD (coronary artery disease)     7 stents per the patient (no record).  Stent to 90% inferior branch of an OM and mild scattered plaque in other vessels [facility]  01/08/13  . Lupus   . AS (aortic stenosis)     Mild  . Pneumonitis     Lupus  . Hypercoagulable state    Current Outpatient Prescriptions on File Prior to Visit  Medication Sig Dispense Refill  . aspirin EC 325 MG tablet Take 0.25 tablets by mouth daily.     . CRESTOR 5 MG tablet Take 5 mg by mouth daily. Only takes on  M W F    . hydroxychloroquine (PLAQUENIL) 200 MG tablet Take 200 mg by mouth.    . metoprolol succinate (TOPROL-XL) 25 MG 24 hr tablet Take 0.5 tablets by mouth 2 (two) times daily.    Marland Kitchen. NASONEX 50 MCG/ACT nasal spray Place 2 sprays into the nose daily.     Marland Kitchen. omeprazole (PRILOSEC) 40 MG capsule Take 1 capsule (40 mg total) by mouth daily. 90 capsule 0  . ramipril (ALTACE) 5 MG capsule Take 1 capsule (5 mg total) by mouth daily. 90 capsule 1  . RANEXA 500 MG 12 hr tablet Take 500 mg by mouth 2 (two) times daily.     Marland Kitchen. warfarin (COUMADIN) 4 MG tablet Take 4 mg by mouth daily at 6 PM.    . warfarin (COUMADIN) 5 MG tablet Take 1 tablet (5 mg total) by mouth daily. M,W,F,S,S 90 tablet 0   No current facility-administered medications on file prior to visit.   Allergies  Allergen Reactions  . Sulfa Antibiotics     Makes patient jittery, prefers not to take them.    Family History  Problem Relation Age of Onset  . Diabetes Mother   . Hypertension Mother   . Arthritis Mother   . Diabetes Father   . Hypertension Father   . Arthritis Father   . CAD Father 2560    died age 63   History   Social History  . Marital Status: Married  Spouse Name: N/A    Number of Children: 2  . Years of Education: N/A   Occupational History  . Not on file.   Social History Main Topics  . Smoking status: Never Smoker   . Smokeless tobacco: Never Used  . Alcohol Use: 3.5 oz/week    7 drink(s) per week  . Drug Use: No  . Sexual Activity: Not on file   Other Topics Concern  . Not on file   Social History Narrative   Lives with wife.          Objective:   Physical Exam  Constitutional: He is oriented to person, place, and time. He appears well-developed and well-nourished. No distress.  Cardiovascular: Normal rate, regular rhythm and normal heart sounds.   No murmur heard. Pulmonary/Chest: Effort normal. No respiratory distress. He has no wheezes.  Chronic breathing issues.  Decreased breath  sounds due to COPD.  Abdominal: Soft. Bowel sounds are normal. He exhibits no distension. There is no tenderness. There is no rebound and no guarding.  Neurological: He is alert and oriented to person, place, and time.  Skin: He is not diaphoretic.  Nursing note and vitals reviewed.       Assessment & Plan:  1. Intermittent diarrhea and chronic gas. Does not seem to be bloody; and has no other signs of a bleed. Diarrhea comes and goes in episodes and is less likely Crohn's etiology. Still has an appetite and no weight changes making it less likely to be an issue of malabsorption. Colonoscopy to be set up. Labs ordered today including CBC, BMP, TSH.   Luiz OchoaMelissa Avital Dancy, PA-S  As above.   3 months hx of intermittent diarrhea with up to 5 stools per day and associated "foul" odor and increased gas.  No appetite or weight change. Denies bloody stools, abdominal pain, recent travels, recent antibiotics, lactose intolerance, gluten intolerance.  Symptoms are very intermittent.  Occasional Imodium helps.  No clear exacerbating factors.  Exam- OP clear Neck supple with no mass Chest-CTA  Cor -RRR with no murmur Abd- soft and nontender with no mass EXtrem- no edema.  A/P-chronic intermittent diarrhea.  No worrisome appetite or weight changes.  Doubt malabsorption with no recent weight loss. No hx of laxative use.  Check labs as above.  Consider GI referral for further evaluation.  Evelena PeatBruce Burchette MD. As above.

## 2014-08-27 LAB — BASIC METABOLIC PANEL
BUN: 12 mg/dL (ref 6–23)
CHLORIDE: 101 meq/L (ref 96–112)
CO2: 29 mEq/L (ref 19–32)
CREATININE: 1 mg/dL (ref 0.4–1.5)
Calcium: 8.9 mg/dL (ref 8.4–10.5)
GFR: 80.09 mL/min (ref >60.00)
Glucose, Bld: 79 mg/dL (ref 70–99)
Potassium: 4.6 mEq/L (ref 3.5–5.1)
Sodium: 137 mEq/L (ref 135–145)

## 2014-08-27 LAB — TSH: TSH: 4.88 u[IU]/mL — ABNORMAL HIGH (ref 0.35–4.50)

## 2014-09-08 ENCOUNTER — Ambulatory Visit (INDEPENDENT_AMBULATORY_CARE_PROVIDER_SITE_OTHER): Payer: Medicare Other | Admitting: Family

## 2014-09-08 DIAGNOSIS — Z5181 Encounter for therapeutic drug level monitoring: Secondary | ICD-10-CM

## 2014-09-08 DIAGNOSIS — D6862 Lupus anticoagulant syndrome: Secondary | ICD-10-CM

## 2014-09-08 DIAGNOSIS — R140 Abdominal distension (gaseous): Secondary | ICD-10-CM

## 2014-09-08 LAB — POCT INR: INR: 2

## 2014-09-08 NOTE — Patient Instructions (Signed)
4 mg every day. Recheck in 4 weeks.  Anticoagulation Dose Instructions as of 09/08/2014      Kenneth SmilesSun Mon Tue Wed Thu Fri Sat   New Dose 4 mg 4 mg 4 mg 4 mg 4 mg 4 mg 4 mg    Description        4 mg every day. Recheck in 4 weeks.

## 2014-09-13 LAB — CELIAC PANEL 10
Endomysial Screen: NEGATIVE
Gliadin IgA: 7 U/mL (ref <20)
Gliadin IgG: 2 U/mL (ref <20)
IgA: 325 mg/dL (ref 68–379)
TISSUE TRANSGLUT AB: 2 U/mL (ref <6)
TISSUE TRANSGLUTAMINASE AB, IGA: 1 U/mL (ref <4)

## 2014-09-15 ENCOUNTER — Telehealth: Payer: Self-pay | Admitting: Family

## 2014-09-15 DIAGNOSIS — K589 Irritable bowel syndrome without diarrhea: Secondary | ICD-10-CM

## 2014-09-15 NOTE — Telephone Encounter (Signed)
Please advise 

## 2014-09-15 NOTE — Telephone Encounter (Signed)
Pt has seen padonda and dr burchette for GI issues.  Pt states he is still being bothered , not any better,and would like referral to GI doc.

## 2014-09-16 NOTE — Telephone Encounter (Signed)
Referral placed.

## 2014-09-28 ENCOUNTER — Encounter: Payer: Self-pay | Admitting: Physician Assistant

## 2014-09-30 ENCOUNTER — Encounter: Payer: Self-pay | Admitting: Family

## 2014-10-01 ENCOUNTER — Encounter: Payer: Self-pay | Admitting: Physician Assistant

## 2014-10-01 ENCOUNTER — Ambulatory Visit (INDEPENDENT_AMBULATORY_CARE_PROVIDER_SITE_OTHER): Payer: Medicare Other | Admitting: Physician Assistant

## 2014-10-01 VITALS — BP 134/84 | HR 74 | Ht 68.0 in | Wt 168.0 lb

## 2014-10-01 DIAGNOSIS — K2270 Barrett's esophagus without dysplasia: Secondary | ICD-10-CM

## 2014-10-01 DIAGNOSIS — R197 Diarrhea, unspecified: Secondary | ICD-10-CM

## 2014-10-01 NOTE — Patient Instructions (Signed)
You have been scheduled for an endoscopy and colonoscopy. Please follow the written instructions given to you at your visit today. Please pick up your prep at the pharmacy within the next 1-3 days. If you use inhalers (even only as needed), please bring them with you on the day of your procedure. Your physician has requested that you go to www.startemmi.com and enter the access code given to you at your visit today. This web site gives a general overview about your procedure. However, you should still follow specific instructions given to you by our office regarding your preparation for the procedure. Your physician has requested that you go to the basement for  lab work before leaving today. You will be contaced by our office prior to your procedure for directions on holding your Coumadin/Warfarin.  If you do not hear from our office 1 week prior to your scheduled procedure, please call [phone] to discuss. CC:  Adline MangoPadonda Campbell MD

## 2014-10-01 NOTE — [facility] Notes (Signed)
Patient ID: [name], male   DOB: 1951/03/08, 63 y.o.   MRN: [id number]    HPI:   [name] is a 63 year old male referred for evaluation by Adline Mango, FNP, due to diarrhea.  Franklyn has a history of coronary artery disease and has had 7 stents placed in the past. He reports that he had a stress perfusion study in January 2015 at another facility and there was no evidence of ischemia. He has had an echo that demonstrated well preserved ejection fraction and mild aortic stenosis. He has a history of lupus with a hypocoagulable state. 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 He has a history of lupus related pneumonitis. His lung function has been stable. He also has a history of GERD, glaucoma, hypertension, hyperlipidemia.  He states that up until 3 or 4 months ago his bowel movements were fairly regular. For the past 3-4 months, he has excess gas and bloating and has diarrhea for 5 days per week. He will use Imodium with moderate relief, but feels the diarrhea is inhibiting his normal daily activities. He has had no bright red blood per rectum or melena. He is unable to identify any specific foods that exacerbate his symptoms he has tried eliminating gluten with no relief. Prior to the onset of his diarrhea, he had not had any antibiotic therapy nor had he traveled outside of the [place]. He has no new pets. He has city water. He has tried probiotics with no relief. He has not had any anorexia or unexplained weight loss. He has no nausea or vomiting. There is no known family history of colon cancer, colon polyps, or inflammatory bowel disease. His mother has diverticular disease. His stools do not look oily.   Past Medical History  Diagnosis Date  . GERD (gastroesophageal reflux disease)   . Glaucoma   . Hypertension   . Hyperlipidemia   . CAD (coronary artery disease)     7 stents per the patient (no record).  Stent to 90% inferior branch of an OM and mild scattered  plaque in other vessels [facility]  01/08/13  . Lupus   . AS (aortic stenosis)     Mild  . Pneumonitis     Lupus  . Hypercoagulable state     Past Surgical History  Procedure Laterality Date  . Tonsillectomy and adenoidectomy    . Knee surgery      bilateral  . Cataract extraction      x 2  . Nasal sinus surgery     Family History  Problem Relation Age of Onset  . Diabetes Mother   . Hypertension Mother   . Arthritis Mother   . Diabetes Father   . Hypertension Father   . Arthritis Father   . CAD Father 33    died age 35   History  Substance Use Topics  . Smoking status: Never Smoker   . Smokeless tobacco: Never Used  . Alcohol Use: 3.5 oz/week    7 drink(s) per week   Current Outpatient Prescriptions  Medication Sig Dispense Refill  . aspirin EC 325 MG tablet Take 0.25 tablets by mouth daily.     . CRESTOR 5 MG tablet Take 5 mg by mouth daily. Only takes on M W F    . hydroxychloroquine (PLAQUENIL) 200 MG tablet Take 200 mg by mouth.    . metoprolol succinate (TOPROL-XL) 25 MG 24 hr tablet Take 0.5 tablets by  mouth 2 (two) times daily.    Marland Kitchen. NASONEX 50 MCG/ACT nasal spray Place 2 sprays into the nose daily.     Marland Kitchen. omeprazole (PRILOSEC) 40 MG capsule Take 1 capsule (40 mg total) by mouth daily. 90 capsule 0  . ramipril (ALTACE) 5 MG capsule Take 1 capsule (5 mg total) by mouth daily. 90 capsule 1  . RANEXA 500 MG 12 hr tablet Take 500 mg by mouth 2 (two) times daily.     Marland Kitchen. warfarin (COUMADIN) 4 MG tablet Take 4 mg by mouth daily at 6 PM.    . warfarin (COUMADIN) 5 MG tablet Take 1 tablet (5 mg total) by mouth daily. M,W,F,S,S 90 tablet 0   No current facility-administered medications for this visit.   Allergies  Allergen Reactions  . Sulfa Antibiotics     Makes patient jittery, prefers not to take them.      Review of Systems: Gen: Denies any fever, chills, sweats, anorexia, fatigue, weakness, malaise, weight loss, and sleep disorder CV: Denies chest pain,  angina, palpitations, syncope, orthopnea, PND, peripheral edema, and claudication. Resp: Denies dyspnea at rest, dyspnea with exercise, cough, sputum, wheezing, coughing up blood, and pleurisy. GI: Denies vomiting blood, jaundice, and fecal incontinence.   Denies dysphagia or odynophagia. GU : Denies urinary burning, blood in urine, urinary frequency, urinary hesitancy, nocturnal urination, and urinary incontinence. MS: Denies joint pain, limitation of movement, and swelling, stiffness, low back pain, extremity pain. Denies muscle weakness, cramps, atrophy.  Derm: Denies rash, itching, dry skin, hives, moles, warts, or unhealing ulcers.  Psych: Denies depression, anxiety, memory loss, suicidal ideation, hallucinations, paranoia, and confusion. Heme: Denies bruising, bleeding, and enlarged lymph nodes. Neuro:  Denies any headaches, dizziness, paresthesias. Endo:  Denies any problems with DM, thyroid, adrenal function  LAB RESULTS: Celiac panel on 09/08/2014 was negative. CBC on 08/26/2014 had a white blood cell count 6.1, hemoglobin 12.7, hematocrit 39.3, platelets 165,000.   Prior Endoscopies:  Upper end[facility] on 01/12/2005 [facility] [place] showed Barrett's esophagus at the distal esophagus. Colonoscopy on 01/12/2005 [facility] [place] showed right colon diverticulum, internal hemorrhoids, no active source of blood loss seen.  Physical Exam: BP 134/84 mmHg  Pulse 74  Ht 5\' 8"  (1.727 m)  Wt 168 lb (76.204 kg)  BMI 25.55 kg/m2 Constitutional: Pleasant,well-developed male in no acute distress. HEENT: Normocephalic and atraumatic. Conjunctivae are normal. No scleral icterus. Neck supple.  Cardiovascular: Normal rate, regular rhythm. Apical systolic murmur Pulmonary/chest: Effort normal and breath sounds normal. No wheezing, rales or rhonchi. Abdominal: Soft, nondistended, nontender. Bowel sounds  active throughout. There are no masses palpable. No hepatomegaly. Extremities: no edema Lymphadenopathy: No cervical adenopathy noted. Neurological: Alert and oriented to person place and time. Skin: Skin is warm and dry. No rashes noted. Psychiatric: Normal mood and affect. Behavior is normal.  ASSESSMENT AND PLAN: #1. Diarrhea. This is been associated with gas and bloating and may possibly be due to some bacterial overgrowth. He will however, be scheduled for colonoscopy to screen for polyps, neoplasia, inflammatory bowel disease, or microscopic colitis.The risks, benefits, and alternatives to colonoscopy with possible biopsy and possible polypectomy were discussed with the patient and they consent to proceed. The risk of holding anticoagulation therapy or antiplatelet medications was discussed including the increased risk for thromboembolic disease that may include DVT, pulmonary emboli and stroke. The patient understands this risk and is willing to proceed with temporally holding  the medication provided that this is approved by her PCP or cardiologist. A stool pathogen panel will also be obtained.  #2. GERD and history Barrett's esophagus. Patient's reflux is currently under good control on omeprazole and he will continue this. He will undergo an EGD at the same setting as his colonoscopy to reevaluate the area of Barrett's.The risks, benefits, and alternatives to endoscopy with possible biopsy and possible dilation were discussed with the patient and they consent to proceed.   Further recommendations will be made pending the findings of his colonoscopy and EGD.    Yarissa Reining, Tollie PizzaLori P PA-C 10/01/2014, 11:43 AM

## 2014-10-01 NOTE — [facility] Notes (Signed)
Agree w/ Ms. Hvozdovic's note and mangement.  

## 2014-10-06 ENCOUNTER — Other Ambulatory Visit: Payer: Medicare Other

## 2014-10-06 ENCOUNTER — Ambulatory Visit (INDEPENDENT_AMBULATORY_CARE_PROVIDER_SITE_OTHER): Payer: Medicare Other | Admitting: Family

## 2014-10-06 DIAGNOSIS — K2270 Barrett's esophagus without dysplasia: Secondary | ICD-10-CM

## 2014-10-06 DIAGNOSIS — R197 Diarrhea, unspecified: Secondary | ICD-10-CM

## 2014-10-06 DIAGNOSIS — Z5181 Encounter for therapeutic drug level monitoring: Secondary | ICD-10-CM

## 2014-10-06 DIAGNOSIS — D6862 Lupus anticoagulant syndrome: Secondary | ICD-10-CM

## 2014-10-06 LAB — POCT INR: INR: 2.5

## 2014-10-06 NOTE — Patient Instructions (Signed)
4 mg every day. Recheck in 6 weeks.  Anticoagulation Dose Instructions as of 10/06/2014      Glynis SmilesSun Mon Tue Wed Thu Fri Sat   New Dose 4 mg 4 mg 4 mg 4 mg 4 mg 4 mg 4 mg    Description        4 mg every day. Recheck in 6 weeks.

## 2014-10-07 ENCOUNTER — Other Ambulatory Visit: Payer: Medicare Other

## 2014-10-07 LAB — GASTROINTESTINAL PATHOGEN PANEL PCR

## 2014-10-09 ENCOUNTER — Other Ambulatory Visit: Payer: Self-pay | Admitting: Family Medicine

## 2014-10-18 ENCOUNTER — Telehealth: Payer: Self-pay

## 2014-10-18 ENCOUNTER — Encounter: Payer: Self-pay | Admitting: Family

## 2014-10-18 NOTE — Telephone Encounter (Signed)
-----   Message from Donata Duff, CMA sent at 10/01/2014 10:43 AM EST ----- Waiting on anti coag for 11/03/14 gessner endo colon pandonda campbell

## 2014-10-18 NOTE — Telephone Encounter (Signed)
Call placed to [phone] advised to fax letter to Fax [phone] and it will be taken care of today.

## 2014-10-19 NOTE — Telephone Encounter (Signed)
Per Padonda, stop coumadin 3 days prior to procedure. Resume coumadin the evening of the procedure. Take 8mg  x 2 days then resume normal dosage. Recheck 1 week after resuming coumadin.  Pt aware and instructed verified on read-back. Advised to call if he has any questions. Appointment scheduled for recheck on 11/10/14 at 1:30

## 2014-10-29 ENCOUNTER — Other Ambulatory Visit: Payer: PPO

## 2014-10-29 DIAGNOSIS — K2270 Barrett's esophagus without dysplasia: Secondary | ICD-10-CM

## 2014-11-01 LAB — GASTROINTESTINAL PATHOGEN PANEL PCR
C. DIFFICILE TOX A/B, PCR: NEGATIVE
Campylobacter, PCR: NEGATIVE
Cryptosporidium, PCR: NEGATIVE
E COLI (ETEC) LT/ST, PCR: NEGATIVE
E COLI (STEC) STX1/STX2, PCR: NEGATIVE
E COLI 0157, PCR: NEGATIVE
Giardia lamblia, PCR: NEGATIVE
Norovirus, PCR: NEGATIVE
Rotavirus A, PCR: NEGATIVE
SHIGELLA, PCR: NEGATIVE
Salmonella, PCR: NEGATIVE

## 2014-11-03 ENCOUNTER — Ambulatory Visit (AMBULATORY_SURGERY_CENTER): Payer: PPO | Admitting: Internal Medicine

## 2014-11-03 ENCOUNTER — Encounter: Payer: Self-pay | Admitting: Internal Medicine

## 2014-11-03 VITALS — BP 134/92 | HR 57 | Temp 95.7°F | Resp 29 | Ht 68.0 in | Wt 168.0 lb

## 2014-11-03 DIAGNOSIS — R197 Diarrhea, unspecified: Secondary | ICD-10-CM

## 2014-11-03 DIAGNOSIS — K2270 Barrett's esophagus without dysplasia: Secondary | ICD-10-CM

## 2014-11-03 MED ORDER — RIFAXIMIN 550 MG PO TABS: 550.0000 mg | ORAL_TABLET | Freq: Three times a day (TID) | ORAL | Status: DC

## 2014-11-03 MED ORDER — SODIUM CHLORIDE 0.9 % IV SOLN: 500.0000 mL | INTRAVENOUS | Status: DC

## 2014-11-03 NOTE — Op Note (Signed)
[facility] [address] [place], [id number]   ENDOSCOPY PROCEDURE REPORT  PATIENT: [name], [name]  MR#: [id number] BIRTHDATE: 05-Mar-1951 , 63  yrs. old GENDER: male ENDOSCOPIST: Iva Booparl E Gessner, MD, [facility] PROCEDURE DATE:  11/03/2014 PROCEDURE:  EGD w/ biopsy ASA CLASS:     Class III INDICATIONS:  surveillance and barrett's. MEDICATIONS: Propofol 200 mg IV, Monitored anesthesia care, and Lidocaine 100 mg IV TOPICAL ANESTHETIC: none  DESCRIPTION OF PROCEDURE: After the risks benefits and alternatives of the procedure were thoroughly explained, informed consent was obtained.  The LB [id number] endoscope was introduced through the mouth and advanced to the second portion of the duodenum , Without limitations.  The instrument was slowly withdrawn as the mucosa was fully examined.    1) 2 x 2 xm tongue of Barrett's at 35-37 cm - biopsied  2) flat polyp at 34 cm distal esophagus - biopsied and mostly removed 3) 3 cm hiatal hernia 37-40 cm 4) otherwise normal EGD.  Retroflexed views revealed no abnormalities.     The scope was then withdrawn from the patient and the procedure completed.  COMPLICATIONS: There were no immediate complications.  ENDOSCOPIC IMPRESSION: 1) 2 x 2 xm tongue of Barrett's at 35-37 cm - biopsied 2) flat polyp at 34 cm distal esophagus - biopsied and mostly removed 3) 3 cm hiatal hernia 37-40 cm 4) otherwise normal EGD  RECOMMENDATIONS: 1.  Await pathology results 2.  Proceed with a Colonoscopy.    eSigned:  Iva Booparl E Gessner, MD, [facility] 11/03/2014 3:16 PM    CC: The Patient and Adline MangoPadonda Campbell, MD

## 2014-11-03 NOTE — Patient Instructions (Addendum)
I saw and took biopsies of the Barrett's esophagus. Nothing worrisome seen there. There was a flat polyp in the esophagus also - I took biopsies - it looked benign.  Will notify results.  Colonoscopy showed no problems.  Please restart warfarin - take twice normal dose next 2 nights then resume normal doses. Have INR checked in 1 week.  As far as diarrhea and bloating - I will prescribe an antibiotic that can eliminate that - its called Xifaxan,. If that fails to help let me know.  I appreciate the opportunity to care for you. Iva Booparl E. Kiaria Quinnell, MD, F[facility] YOU HAD AN ENDOSCOPIC PROCEDURE TODAY AT THE [facility]: Refer to the procedure report that was given to you for any specific questions about what was found during the examination.  If the procedure report does not answer your questions, please call your gastroenterologist to clarify.  If you requested that your care partner not be given the details of your procedure findings, then the procedure report has been included in a sealed envelope for you to review at your convenience later.  YOU SHOULD EXPECT: Some feelings of bloating in the abdomen. Passage of more gas than usual.  Walking can help get rid of the air that was put into your GI tract during the procedure and reduce the bloating. If you had a lower endoscopy (such as a colonoscopy or flexible sigmoidoscopy) you may notice spotting of blood in your stool or on the toilet paper. If you underwent a bowel prep for your procedure, then you may not have a normal bowel movement for a few days.  DIET: Your first meal following the procedure should be a light meal and then it is ok to progress to your normal diet.  A half-sandwich or bowl of soup is an example of a good first meal.  Heavy or fried foods are harder to digest and may make you feel nauseous or bloated.  Likewise meals heavy in dairy and vegetables can cause extra gas to form and this can also increase the  bloating.  Drink plenty of fluids but you should avoid alcoholic beverages for 24 hours.  ACTIVITY: Your care partner should take you home directly after the procedure.  You should plan to take it easy, moving slowly for the rest of the day.  You can resume normal activity the day after the procedure however you should NOT DRIVE or use heavy machinery for 24 hours (because of the sedation medicines used during the test).    SYMPTOMS TO REPORT IMMEDIATELY: A gastroenterologist can be reached at any hour.  During normal business hours, 8:30 AM to 5:00 PM Monday through Friday, call [phone].  After hours and on weekends, please call the GI answering service at [phone] who will take a message and have the physician on call contact you.   Following lower endoscopy (colonoscopy or flexible sigmoidoscopy):  Excessive amounts of blood in the stool  Significant tenderness or worsening of abdominal pains  Swelling of the abdomen that is new, acute  Fever of 100F or higher  Following upper endoscopy (EGD)  Vomiting of blood or coffee ground material  New chest pain or pain under the shoulder blades  Painful or persistently difficult swallowing  New shortness of breath  Fever of 100F or higher  Black, tarry-looking stools  FOLLOW UP: If any biopsies were taken you will be contacted by phone or by letter within the next 1-3 weeks.  Call your gastroenterologist  if you have not heard about the biopsies in 3 weeks.  Our staff will call the home number listed on your records the next business day following your procedure to check on you and address any questions or concerns that you may have at that time regarding the information given to you following your procedure. This is a courtesy call and so if there is no answer at the home number and we have not heard from you through the emergency physician on call, we will assume that you have returned to your regular daily activities without  incident.  SIGNATURES/CONFIDENTIALITY: You and/or your care partner have signed paperwork which will be entered into your electronic medical record.  These signatures attest to the fact that that the information above on your After Visit Summary has been reviewed and is understood.  Full responsibility of the confidentiality of this discharge information lies with you and/or your care-partner.  Recommendations Discharge instructions provided to patient/care partner and hiatal hernia handout provided.  Xifaxan prescription called in to [facility] [address]  Instructions given to restart Coumadin.

## 2014-11-03 NOTE — [facility] Notes (Signed)
Report to PACU, RN, vss, BBS= Clear.  

## 2014-11-03 NOTE — [facility] Notes (Signed)
Called to room to assist during endoscopic procedure.  Patient ID and intended procedure confirmed with present staff. Received instructions for my participation in the procedure from the performing physician.  

## 2014-11-03 NOTE — Op Note (Signed)
[facility] [address] [place], [id number]   COLONOSCOPY PROCEDURE REPORT  PATIENT: [name], [name]  MR#: [id number] BIRTHDATE: 1951-04-16 , 63  yrs. old GENDER: male ENDOSCOPIST: Iva Booparl E Ayinde Swim, MD, [facility] PROCEDURE DATE:  11/03/2014 PROCEDURE:   Colonoscopy, diagnostic First Screening Colonoscopy - Avg.  risk and is 50 yrs.  old or older - No.  Prior Negative Screening - Now for repeat screening. N/A  History of Adenoma - Now for follow-up colonoscopy & has been > or = to 3 yrs.  N/A  Polyps Removed Today? No.  Polyps Removed Today? No.  Recommend repeat exam, <10 yrs? Polyps Removed Today? No.  Recommend repeat exam, <10 yrs? No. ASA CLASS:   Class III INDICATIONS:unexplained diarrhea. MEDICATIONS: Residual sedation present and Monitored anesthesia care   DESCRIPTION OF PROCEDURE:   After the risks benefits and alternatives of the procedure were thoroughly explained, informed consent was obtained.  The digital rectal exam revealed no abnormalities of the rectum, revealed no prostatic nodules, and revealed the prostate was not enlarged.   The LB [id number] endoscope was introduced through the anus and advanced to the cecum, which was identified by both the appendix and ileocecal valve. No adverse events experienced.   The quality of the prep was excellent, using MiraLax  The instrument was then slowly withdrawn as the colon was fully examined.      COLON FINDINGS: A normal appearing cecum, ileocecal valve, and appendiceal orifice were identified.  The ascending, transverse, descending, sigmoid colon, and rectum appeared unremarkable. Retroflexed views revealed no abnormalities. The time to cecum=2 minutes 11 seconds.  Withdrawal time=8 minutes 29 seconds.  The scope was withdrawn and the procedure completed. COMPLICATIONS: There were no immediate complications.  ENDOSCOPIC IMPRESSION: Normal colonoscopy  RECOMMENDATIONS: 1) Trial of Xifaxan 550  mg tid x 14 d for diarrhea 2) resume warfarin 3) call back if diarrhea persists after treatment  eSigned:  Iva Booparl E Earline Stiner, MD, [facility] 11/03/2014 3:20 PM   cc:  The Patient and Adline MangoPadonda Campbell, NP

## 2014-11-04 ENCOUNTER — Telehealth: Payer: Self-pay | Admitting: *Deleted

## 2014-11-04 NOTE — Telephone Encounter (Signed)
  Follow up Call-  Call back number 11/03/2014  Post procedure Call Back phone  # [phone]  Permission to leave phone message Yes     Patient questions:  Do you have a fever, pain , or abdominal swelling? No. Pain Score  0 *  Have you tolerated food without any problems? Yes.    Have you been able to return to your normal activities? Yes.    Do you have any questions about your discharge instructions: Diet   No. Medications  No. Follow up visit  No.  Do you have questions or concerns about your Care? No.  Actions: * If pain score is 4 or above: No action needed, pain <4.

## 2014-11-10 ENCOUNTER — Ambulatory Visit (INDEPENDENT_AMBULATORY_CARE_PROVIDER_SITE_OTHER): Payer: PPO | Admitting: Family

## 2014-11-10 DIAGNOSIS — Z5181 Encounter for therapeutic drug level monitoring: Secondary | ICD-10-CM

## 2014-11-10 DIAGNOSIS — D6862 Lupus anticoagulant syndrome: Secondary | ICD-10-CM

## 2014-11-10 LAB — POCT INR: INR: 2.2

## 2014-11-10 NOTE — Patient Instructions (Signed)
4 mg every day. Recheck in 6 weeks.  Anticoagulation Dose Instructions as of 11/10/2014      Glynis SmilesSun Mon Tue Wed Thu Fri Sat   New Dose 4 mg 4 mg 4 mg 4 mg 4 mg 4 mg 4 mg    Description        4 mg every day. Recheck in 6 weeks.

## 2014-11-11 ENCOUNTER — Encounter: Payer: Self-pay | Admitting: Internal Medicine

## 2014-11-11 DIAGNOSIS — K2270 Barrett's esophagus without dysplasia: Secondary | ICD-10-CM

## 2014-11-11 HISTORY — DX: Barrett's esophagus without dysplasia: K22.70

## 2014-11-11 NOTE — [facility] Notes (Signed)
Quick Note:  Barrett's no intestinal metaplasia "polyp" was reflux related  Needs repeat EGD 5 years and colonoscopy 10 years ______

## 2014-11-16 ENCOUNTER — Telehealth: Payer: Self-pay | Admitting: Family

## 2014-11-16 MED ORDER — OMEPRAZOLE 40 MG PO CPDR: DELAYED_RELEASE_CAPSULE | ORAL | Status: DC

## 2014-11-16 NOTE — Telephone Encounter (Signed)
Done

## 2014-11-16 NOTE — Telephone Encounter (Signed)
Pt request refill  omeprazole (PRILOSEC) 40 MG capsule  90 day Pt had gotten this at [facility] but would ike to get locally. No longeefillr can use [facility].  Needs new rx sent to [facility] [address]

## 2014-11-18 ENCOUNTER — Ambulatory Visit: Payer: Medicare Other | Admitting: Family

## 2014-12-01 ENCOUNTER — Ambulatory Visit (INDEPENDENT_AMBULATORY_CARE_PROVIDER_SITE_OTHER): Payer: PPO | Admitting: Cardiology

## 2014-12-01 ENCOUNTER — Encounter: Payer: Self-pay | Admitting: Cardiology

## 2014-12-01 VITALS — BP 110/80 | HR 68 | Ht 68.0 in | Wt 169.0 lb

## 2014-12-01 DIAGNOSIS — I2510 Atherosclerotic heart disease of native coronary artery without angina pectoris: Secondary | ICD-10-CM

## 2014-12-01 MED ORDER — METOPROLOL SUCCINATE ER 25 MG PO TB24: 25.0000 mg | ORAL_TABLET | Freq: Two times a day (BID) | ORAL | Status: DC

## 2014-12-01 NOTE — Patient Instructions (Signed)
Your physician recommends that you schedule a follow-up appointment in: 6 months with Dr. Antoine PocheHochrein  We are ordering a stress test for you to get done  Start taking your metoprolol at the increased dose of 25 mg two times a day

## 2014-12-01 NOTE — Progress Notes (Signed)
HPI The patient presents for follow up of a history of coronary disease.  He has an extensive history of coronary disease and does report 7 stents in the past. (I don't have these records.)  He had a stress perfusion study in January 2015 at another institution. There was not any evidence of ischemia or infarct. He's had a well preserved ejection fraction. He did have an echo last year that demonstrated well-preserved ejection fraction and mild aortic stenosis.   Since I last saw him he has had some discomfort when he is on his treadmill. He says he is exercising 2 hours per day with some of this being weights but some time on a treadmill. When he gets going on the treadmill he might have some discomfort it is somewhat reminiscent of previous angina but more vague. He's not having any resting cardiovascular symptoms by his report. There is no new shortness of breath, PND or orthopnea. There are no new palpitations, presyncope or syncope.  Allergies  Allergen Reactions  . Sulfa Antibiotics     Makes patient jittery, prefers not to take them.     Current Outpatient Prescriptions  Medication Sig Dispense Refill  . aspirin EC 325 MG tablet Take 0.25 tablets by mouth daily.     . CRESTOR 5 MG tablet Take 5 mg by mouth daily. Only takes on M W F    . hydroxychloroquine (PLAQUENIL) 200 MG tablet Take 200 mg by mouth.    . metoprolol succinate (TOPROL-XL) 25 MG 24 hr tablet Take 0.5 tablets by mouth 2 (two) times daily.    Marland Kitchen NASONEX 50 MCG/ACT nasal spray Place 2 sprays into the nose daily.     Marland Kitchen omeprazole (PRILOSEC) 40 MG capsule TAKE 1 BY MOUTH DAILY 90 capsule 3  . ramipril (ALTACE) 5 MG capsule Take 1 capsule (5 mg total) by mouth daily. 90 capsule 1  . RANEXA 500 MG 12 hr tablet Take 500 mg by mouth 2 (two) times daily.     Marland Kitchen warfarin (COUMADIN) 5 MG tablet Take 1 tablet (5 mg total) by mouth daily. M,W,F,S,S 90 tablet 0   No current facility-administered medications for this visit.     Past Medical History  Diagnosis Date  . GERD (gastroesophageal reflux disease)   . Glaucoma   . Hypertension   . Hyperlipidemia   . CAD (coronary artery disease)     7 stents per the patient (no record).  Stent to 90% inferior branch of an OM and mild scattered plaque in other vessels [facility]  01/08/13  . Lupus   . AS (aortic stenosis)     Mild  . Pneumonitis     Lupus  . Hypercoagulable state   . Barrett's esophagus 11/11/2014    Past Surgical History  Procedure Laterality Date  . Tonsillectomy and adenoidectomy    . Knee surgery      bilateral  . Cataract extraction      x 2  . Nasal sinus surgery    . Colonoscopy    . Upper gastrointestinal endoscopy      ROS:  Knee pain.  Otherwise as stated in the HPI and negative for all other systems.  PHYSICAL EXAM BP 110/80 mmHg  Pulse 68  Ht  (1.727 m)  Wt 169 lb (76.658 kg)  BMI 25.70 kg/m2 GENERAL:  Well appearing NECK:  No jugular venous distention, waveform within normal limits, carotid upstroke brisk and symmetric, possible left bruit, no thyromegaly LUNGS:  Clear to auscultation bilaterally BACK:  No CVA tenderness CHEST:  Unremarkable HEART:  PMI not displaced or sustained,S1 and S2 within normal limits, no S3, no S4, no clicks, no rubs, apical systolic murmur at the apex and slightly radiating, no diasotlic murmurs ABD:  Flat, positive bowel sounds normal in frequency in pitch, no bruits, no rebound, no guarding, no midline pulsatile mass, no hepatomegaly, no splenomegaly EXT:  2 plus pulses left radial, decreased right radial, no edema, no cyanosis no clubbing, discoloration on the legs.     EKG:  Sinus rhythm, rate 68, axis within normal limits, intervals within normal limits, no acute ST-T wave changes.  Low voltage.   12/01/2014   ASSESSMENT AND PLAN  CAD:  His symptoms are somewhat hard to categorize. I'm going to get him on a treadmill. This will be on his beta blocker. He may not get to his  target heart rate. However, this is to understand whether there are any high risk findings that his usual level of exercise. I will increase his beta blocker slightly.  HTN:  His blood pressure is controlled. We will continue the meds as listed.  DYSLIPIDEMIA:  I did check a lipid profile and the LD was 39.  No change in therapy is indicated.  BRUIT:  He had only mild plaque.  No further work up is indicated.   AS:  This is mild. No change in therapy is indicated. We will follow this clinically.  HYPERCOAGUABLE:  He will follow up with his primary provider for his INRs.   He says that it was related to Lupus.  I have suggested in the past follow-up with a rheumatologist but I will defer to CAMPBELL, PADONDA BOYD, FNP

## 2014-12-02 ENCOUNTER — Telehealth: Payer: Self-pay | Admitting: *Deleted

## 2014-12-02 ENCOUNTER — Other Ambulatory Visit: Payer: Self-pay | Admitting: *Deleted

## 2014-12-02 MED ORDER — METOPROLOL TARTRATE 25 MG PO TABS: 25.0000 mg | ORAL_TABLET | Freq: Two times a day (BID) | ORAL | Status: DC

## 2014-12-02 NOTE — Telephone Encounter (Signed)
Received a fax from [facility] and she stated [place] from [place] had written a script for metoprolol tart, when we saw the pt on 2/17 pt. Told us and it was in his chart as succ. 25mg  1/2 tab bid, i talked to dr. Antoine Pochehochrein and we corrected the med in our office  And med list to be 25 mg one po bid

## 2014-12-13 ENCOUNTER — Telehealth: Payer: Self-pay | Admitting: Cardiology

## 2014-12-13 DIAGNOSIS — E7800 Pure hypercholesterolemia, unspecified: Secondary | ICD-10-CM

## 2014-12-13 MED ORDER — ROSUVASTATIN CALCIUM 5 MG PO TABS: 5.0000 mg | ORAL_TABLET | Freq: Every day | ORAL | Status: DC

## 2014-12-13 NOTE — Telephone Encounter (Signed)
Refill submitted to patient's preferred pharmacy.  

## 2014-12-13 NOTE — Telephone Encounter (Signed)
°  1. Which medications need to be refilled? Crestor  2. Which [facility] is medication to be sent to[facility] [place]  3. Do they need a 30 day or 90 day supply? 30  4. Would they like a call back once the medication has been sent to the pharmacy? no

## 2014-12-23 ENCOUNTER — Ambulatory Visit (INDEPENDENT_AMBULATORY_CARE_PROVIDER_SITE_OTHER): Payer: PPO | Admitting: General Practice

## 2014-12-23 ENCOUNTER — Encounter (HOSPITAL_COMMUNITY): Payer: PPO

## 2014-12-23 DIAGNOSIS — Z5181 Encounter for therapeutic drug level monitoring: Secondary | ICD-10-CM

## 2014-12-23 DIAGNOSIS — D6862 Lupus anticoagulant syndrome: Secondary | ICD-10-CM

## 2014-12-23 LAB — POCT INR: INR: 2.5

## 2014-12-23 NOTE — Progress Notes (Signed)
Pre visit review using our clinic review tool, if applicable. No additional management support is needed unless otherwise documented below in the visit note. 

## 2014-12-24 ENCOUNTER — Encounter (HOSPITAL_COMMUNITY): Payer: Self-pay | Admitting: *Deleted

## 2015-01-04 ENCOUNTER — Telehealth (HOSPITAL_COMMUNITY): Payer: Self-pay

## 2015-01-04 NOTE — Telephone Encounter (Signed)
Encounter complete. 

## 2015-01-06 ENCOUNTER — Ambulatory Visit (HOSPITAL_COMMUNITY)
Admission: RE | Admit: 2015-01-06 | Discharge: 2015-01-06 | Disposition: A | Discharge status: Home / Self Care | Payer: PPO | Source: Ambulatory Visit | Attending: Cardiology | Admitting: Cardiology

## 2015-01-06 ENCOUNTER — Encounter: Payer: Self-pay | Admitting: Cardiology

## 2015-01-06 ENCOUNTER — Ambulatory Visit (INDEPENDENT_AMBULATORY_CARE_PROVIDER_SITE_OTHER): Payer: PPO | Admitting: Cardiology

## 2015-01-06 VITALS — BP 140/90 | HR 82 | Ht 68.0 in | Wt 166.4 lb

## 2015-01-06 DIAGNOSIS — I25118 Atherosclerotic heart disease of native coronary artery with other forms of angina pectoris: Secondary | ICD-10-CM | POA: Diagnosis not present

## 2015-01-06 DIAGNOSIS — R079 Chest pain, unspecified: Secondary | ICD-10-CM | POA: Diagnosis not present

## 2015-01-06 DIAGNOSIS — D6862 Lupus anticoagulant syndrome: Secondary | ICD-10-CM | POA: Diagnosis not present

## 2015-01-06 DIAGNOSIS — I2510 Atherosclerotic heart disease of native coronary artery without angina pectoris: Secondary | ICD-10-CM | POA: Insufficient documentation

## 2015-01-06 DIAGNOSIS — E7800 Pure hypercholesterolemia, unspecified: Secondary | ICD-10-CM

## 2015-01-06 DIAGNOSIS — R9439 Abnormal result of other cardiovascular function study: Secondary | ICD-10-CM | POA: Insufficient documentation

## 2015-01-06 MED ORDER — AMLODIPINE BESYLATE 2.5 MG PO TABS: 2.5000 mg | ORAL_TABLET | Freq: Every day | ORAL | Status: DC

## 2015-01-06 NOTE — Patient Instructions (Signed)
Continue your current medication.  We will add amlodipine 2.5 mg daily  I will schedule you a follow up visit with Dr. Antoine PocheHochrein in 4-6 weeks.

## 2015-01-06 NOTE — Procedures (Signed)
Exercise Treadmill Test  Pre-Exercise Testing Evaluation NSR   Voltage criteria only for LVH              Test  Exercise Tolerance Test Ordering MD: Angelina SheriffJake Hochrein, MD    Unique Test No: 1  Treadmill:  1  Indication for ETT: chest pain - rule out ischemia  Contraindication to ETT: No   Stress Modality: exercise - treadmill  Cardiac Imaging Performed: non   Protocol: standard Bruce - maximal  Max BP:  146/104  Max MPHR (bpm):  157 85% MPR (bpm):  133  MPHR obtained (bpm):  134 % MPHR obtained:  85  Reached 85% MPHR (min:sec):  8:20 Total Exercise Time (min-sec):  9  Workload in METS:  10.1 Borg Scale: 16  Reason ETT Terminated:  ST depression, SOB and Patient request Pt. Had positive test and is being seen by Dr. Swazil[place]Jordan, 01/06/15 @ 3:00 pm   ST Segment Analysis At Rest: normal ST segments - no evidence of significant ST depression With Exercise: significant ischemic ST depression 1.5-2.5 mm horizontal ST depression in inferior leads and V5-V6  Other Information Arrhythmia:  rare PVCs Angina during ETT:  present (1) Quality of ETT:  diagnostic  ETT Interpretation:  abnormal - evidence of ST depression consistent with ischemia  Comments: Hypertensive response to exercise  Recommendations: Further evaluation for coronary insufficiency.  [name] FairMihai Mayur Duman, MD, [facility] [phone] office [phone] pager

## 2015-01-07 NOTE — Progress Notes (Signed)
HPI [name] is seen as a work in today following an abnormal ETT.  He has an extensive history of coronary disease and does report 7 stents in the past. His last intervention was stenting of [place] OM branch in [place]  He had a stress perfusion study in January 2015 at another institution. There was not any evidence of ischemia or infarct. He's had a well preserved ejection fraction. He did have an echo last year that demonstrated well-preserved ejection fraction and mild aortic stenosis.  On his recent visit with Dr. Antoine PocheHochrein he noted symptoms of chest tightness with exercise. He reports he exercises 2 hours a day. About 30 minutes of this is aerobic and the rest weight lifting. His symptoms are similar but not as severe as prior angina. He denies any SOB, palpitations, or dizziness. He was referred for ETT today. On this exam he was able to complete stage 3 of the Bruce protocol. He had 2/10 chest tightness. Peak HR 134. He did develop 2-2.5 mm ST depression in the inferolateral leads consistent with ischemia. These changes resolved in recovery and he is now pain free. The test was performed on metoprolol and Ranexa. He does have a history of DVT and ?PE in the past related to lupus anticoagulant and is on chronic coumadin. He reports that his metoprolol dose was increased on his last visit but he felt terrible on this dose and reduced it back down.  Allergies  Allergen Reactions  . Sulfa Antibiotics     Makes patient jittery, prefers not to take them.     Current Outpatient Prescriptions  Medication Sig Dispense Refill  . aspirin EC 325 MG tablet Take 0.25 tablets by mouth daily.     . metoprolol tartrate (LOPRESSOR) 25 MG tablet Take 1 tablet (25 mg total) by mouth 2 (two) times daily. 180 tablet 3  . NASONEX 50 MCG/ACT nasal spray Place 2 sprays into the nose daily.     Marland Kitchen. omeprazole (PRILOSEC) 40 MG capsule TAKE 1 BY MOUTH DAILY 90 capsule 3  . ramipril (ALTACE) 5 MG  capsule Take 1 capsule (5 mg total) by mouth daily. 90 capsule 1  . RANEXA 500 MG 12 hr tablet Take 500 mg by mouth 2 (two) times daily.     . rosuvastatin (CRESTOR) 5 MG tablet Take 1 tablet (5 mg total) by mouth daily. Only takes on M W F 30 tablet 4  . warfarin (COUMADIN) 5 MG tablet Take 1 tablet (5 mg total) by mouth daily. M,W,F,S,S 90 tablet 0  . amLODipine (NORVASC) 2.5 MG tablet Take 1 tablet (2.5 mg total) by mouth daily. 30 tablet 3   No current facility-administered medications for this visit.    Past Medical History  Diagnosis Date  . GERD (gastroesophageal reflux disease)   . Glaucoma   . Hypertension   . Hyperlipidemia   . CAD (coronary artery disease)     7 stents per the patient (no record).  Stent to 90% inferior branch of an OM and mild scattered plaque in other vessels [facility]  01/08/13  . Lupus   . AS (aortic stenosis)     Mild  . Pneumonitis     Lupus  . Hypercoagulable state   . Barrett's esophagus 11/11/2014    Past Surgical History  Procedure Laterality Date  . Tonsillectomy and adenoidectomy    . Knee surgery      bilateral  . Cataract extraction  x 2  . Nasal sinus surgery    . Colonoscopy    . Upper gastrointestinal endoscopy      ROS:  Knee pain.  Otherwise as stated in the HPI and negative for all other systems.  PHYSICAL EXAM BP 140/90 mmHg  Pulse 82  Ht  (1.727 m)  Wt 166 lb 6.4 oz (75.479 kg)  BMI 25.31 kg/m2 GENERAL:  Well appearing NECK:  No jugular venous distention, waveform within normal limits, carotid upstroke brisk and symmetric, possible left bruit, no thyromegaly LUNGS:  Clear to auscultation bilaterally BACK:  No CVA tenderness CHEST:  Unremarkable HEART:  PMI not displaced or sustained,S1 and S2 within normal limits, no S3, no S4, no clicks, no rubs, apical systolic murmur at the apex and slightly radiating, no diasotlic murmurs ABD:  Flat, positive bowel sounds normal in frequency in pitch, no bruits, no  rebound, no guarding, no midline pulsatile mass, no hepatomegaly, no splenomegaly EXT:  Decreased bilateral radial pulses- right radial is absent. Left radial 1+, no edema, no cyanosis no clubbing, discoloration on the legs.     ETT personally reviewed as noted in HPI  ASSESSMENT AND PLAN  CAD:  S/p multiple stents in the past. Now class 2 angina. Positive ETT for ischemia at good work low. We discussed that these findings put him at intermediate risk (good exercise tolerance, normal LV function) but do indicate ischemia. We discussed options at this point including proceeding with cardiac cath or intensifying his medical therapy. He is not anxious to pursue invasive evaluation at this time and would like to try medical therapy. He did not tolerate increase in beta blocker. We will add amlodipine 2.5 mg daily to Ranexa and metoprolol. He understands that if his symptoms worsen we would recommend cardiac cath. I will arrange follow up with Dr Antoine Poche in about 6 weeks to review response. If cardiac cath needed he would need to come off coumadin and bridged with Lovenox. He would likely need femoral access given poor radial pulses.   HTN:  His blood pressure is controlled. We will continue the meds as listed.  DYSLIPIDEMIA:  Well controlled on Crestor.  BRUIT:  He had only mild plaque.  No further work up is indicated.   AS:  This is mild. No change in therapy is indicated. We will follow this clinically.  HYPERCOAGUABLE:  He will follow up with his primary provider for his INRs.

## 2015-01-10 ENCOUNTER — Telehealth: Payer: Self-pay | Admitting: Family

## 2015-01-10 NOTE — Telephone Encounter (Signed)
Pt request refill of the following   :warfarin (COUMADIN) 5 MG tablet   [facility] [place]

## 2015-01-11 ENCOUNTER — Telehealth: Payer: Self-pay | Admitting: Family

## 2015-01-11 ENCOUNTER — Other Ambulatory Visit: Payer: Self-pay | Admitting: General Practice

## 2015-01-11 ENCOUNTER — Telehealth: Payer: Self-pay | Admitting: General Practice

## 2015-01-11 ENCOUNTER — Other Ambulatory Visit: Payer: Self-pay | Admitting: Family

## 2015-01-11 MED ORDER — WARFARIN SODIUM 4 MG PO TABS
ORAL_TABLET | ORAL | Status: DC
Start: 2015-01-11 — End: 2015-08-16

## 2015-01-11 NOTE — Telephone Encounter (Signed)
Called patient to verify that he is using coumadin 4 mg tablets.  Patient verified.

## 2015-01-11 NOTE — Telephone Encounter (Signed)
F[facility]ed [facility] requesting dosage change clarification on warfarin 4 mg tablet.  [facility]

## 2015-02-03 ENCOUNTER — Ambulatory Visit (INDEPENDENT_AMBULATORY_CARE_PROVIDER_SITE_OTHER): Payer: PPO | Admitting: General Practice

## 2015-02-03 DIAGNOSIS — D6862 Lupus anticoagulant syndrome: Secondary | ICD-10-CM

## 2015-02-03 DIAGNOSIS — Z5181 Encounter for therapeutic drug level monitoring: Secondary | ICD-10-CM | POA: Diagnosis not present

## 2015-02-03 LAB — POCT INR: INR: 2.6

## 2015-02-03 NOTE — Progress Notes (Signed)
Pre visit review using our clinic review tool, if applicable. No additional management support is needed unless otherwise documented below in the visit note. 

## 2015-02-11 ENCOUNTER — Ambulatory Visit: Payer: PPO | Admitting: Cardiology

## 2015-02-15 ENCOUNTER — Ambulatory Visit (INDEPENDENT_AMBULATORY_CARE_PROVIDER_SITE_OTHER): Payer: PPO | Admitting: Cardiology

## 2015-02-15 VITALS — BP 136/72 | HR 78 | Ht 68.0 in | Wt 166.0 lb

## 2015-02-15 DIAGNOSIS — I2510 Atherosclerotic heart disease of native coronary artery without angina pectoris: Secondary | ICD-10-CM | POA: Diagnosis not present

## 2015-02-15 MED ORDER — AMLODIPINE BESYLATE 5 MG PO TABS: 5.0000 mg | ORAL_TABLET | Freq: Every day | ORAL | Status: DC

## 2015-02-15 NOTE — Progress Notes (Signed)
HPI The patient presents for follow up of a history of coronary disease.  He has an extensive history of coronary disease and does report 7 stents in the past. (I don't have these records.)  He had a stress perfusion study in January 2015 at another institution. There was not any evidence of ischemia or infarct. He's had a well preserved ejection fraction. He did have an echo last year that demonstrated well-preserved ejection fraction and mild aortic stenosis. He had been having some exertional chest pain. I put him on a treadmill and he did have some changes. He was able to complete stage 3 of the Bruce protocol. He had 2/10 chest tightness. Peak HR 134. He did develop 2-2.5 mm ST depression in the inferolateral leads consistent with ischemia. These changes resolved in recovery and he is now pain free. The test was performed on metoprolol and Ranexa.  He saw Dr. [place] and it was decided to manage him medically and amlodipine was added to his regimen.  Since then he thinks he's had a little less exertional discomfort. He's able to get on the treadmill can go to a heart rate of about 115 which is about 25 minutes. With this he might get some chest discomfort and he stops. He's not having any resting symptoms. He denies any acute shortness of breath, PND or orthopnea. He's had no palpitations, presyncope or syncope. He's had no weight gain or edema.    Allergies  Allergen Reactions  . Sulfa Antibiotics     Makes patient jittery, prefers not to take them.     Current Outpatient Prescriptions  Medication Sig Dispense Refill  . amLODipine (NORVASC) 2.5 MG tablet Take 1 tablet (2.5 mg total) by mouth daily. 30 tablet 3  . aspirin EC 325 MG tablet Take 0.25 tablets by mouth daily.     . metoprolol tartrate (LOPRESSOR) 25 MG tablet Take 1 tablet (25 mg total) by mouth 2 (two) times daily. 180 tablet 3  . NASONEX 50 MCG/ACT nasal spray Place 2 sprays into the nose daily.     Marland Kitchen. omeprazole (PRILOSEC)  40 MG capsule TAKE 1 BY MOUTH DAILY 90 capsule 3  . ramipril (ALTACE) 5 MG capsule Take 1 capsule (5 mg total) by mouth daily. 90 capsule 1  . RANEXA 500 MG 12 hr tablet Take 500 mg by mouth 2 (two) times daily.     . rosuvastatin (CRESTOR) 5 MG tablet Take 1 tablet (5 mg total) by mouth daily. Only takes on M W F 30 tablet 4  . warfarin (COUMADIN) 4 MG tablet Take as directed by anticoagulation clinic 90 tablet 1   No current facility-administered medications for this visit.    Past Medical History  Diagnosis Date  . GERD (gastroesophageal reflux disease)   . Glaucoma   . Hypertension   . Hyperlipidemia   . CAD (coronary artery disease)     7 stents per the patient (no record).  Stent to 90% inferior branch of an OM and mild scattered plaque in other vessels [facility]  01/08/13  . Lupus   . AS (aortic stenosis)     Mild  . Pneumonitis     Lupus  . Hypercoagulable state   . Barrett's esophagus 11/11/2014    Past Surgical History  Procedure Laterality Date  . Tonsillectomy and adenoidectomy    . Knee surgery      bilateral  . Cataract extraction      x 2  . Nasal  sinus surgery    . Colonoscopy    . Upper gastrointestinal endoscopy      ROS:  As stated in the HPI and negative for all other systems.  PHYSICAL EXAM BP 136/72 mmHg  Pulse 78  Ht  (1.727 m)  Wt 166 lb (75.297 kg)  BMI 25.25 kg/m2 GENERAL:  Well appearing NECK:  No jugular venous distention, waveform within normal limits, carotid upstroke brisk and symmetric, possible left bruit, no thyromegaly LUNGS:  Clear to auscultation bilaterally BACK:  No CVA tenderness CHEST:  Unremarkable HEART:  PMI not displaced or sustained,S1 and S2 within normal limits, no S3, no S4, no clicks, no rubs, apical systolic murmur at the apex and slightly radiating, no diasotlic murmurs ABD:  Flat, positive bowel sounds normal in frequency in pitch, no bruits, no rebound, no guarding, no midline pulsatile mass, no  hepatomegaly, no splenomegaly EXT:  2 plus pulses left radial, decreased right radial, no edema, no cyanosis no clubbing.   ASSESSMENT AND PLAN  CAD:  He seems to be improved with the Norvasc. His symptoms are exertional. I'm going to increase the Norvasc to 5 mg daily. He will let me know how he does with this.  HTN:  This is being treated in the context of managing his coronary disease.  DYSLIPIDEMIA:  I did check a lipid profile and the LDL was 39 in October of last year.  No change in therapy is indicated.  BRUIT:  He had only mild plaque.  No further work up is indicated.   AS:  This is mild. No change in therapy is indicated. We will follow this clinically.  HYPERCOAGUABLE:  He will follow up with his primary provider for his INRs.   He says that it was related to Lupus.  I have suggested in the past follow-up with a rheumatologist but I will defer to Eulis Foster, FNP

## 2015-02-15 NOTE — Patient Instructions (Signed)
Your physician recommends that you schedule a follow-up appointment in: 2 months with Dr. Antoine PocheHochrein   we have increased your Amlodipine to 5 mg Daily

## 2015-02-16 ENCOUNTER — Encounter: Payer: Self-pay | Admitting: Cardiology

## 2015-02-28 ENCOUNTER — Ambulatory Visit (INDEPENDENT_AMBULATORY_CARE_PROVIDER_SITE_OTHER): Payer: PPO | Admitting: Adult Health

## 2015-02-28 ENCOUNTER — Encounter: Payer: Self-pay | Admitting: Adult Health

## 2015-02-28 VITALS — BP 130/80 | Temp 97.6°F | Ht 68.0 in | Wt 166.1 lb

## 2015-02-28 DIAGNOSIS — H409 Unspecified glaucoma: Secondary | ICD-10-CM

## 2015-02-28 DIAGNOSIS — M79622 Pain in left upper arm: Secondary | ICD-10-CM | POA: Diagnosis not present

## 2015-02-28 DIAGNOSIS — M25522 Pain in left elbow: Secondary | ICD-10-CM

## 2015-02-28 DIAGNOSIS — Z0000 Encounter for general adult medical examination without abnormal findings: Secondary | ICD-10-CM

## 2015-02-28 DIAGNOSIS — I10 Essential (primary) hypertension: Secondary | ICD-10-CM | POA: Diagnosis not present

## 2015-02-28 NOTE — Progress Notes (Signed)
Pre visit review using our clinic review tool, if applicable. No additional management support is needed unless otherwise documented below in the visit note. 

## 2015-02-28 NOTE — Progress Notes (Addendum)
HPI:  [name] is here to establish care.  Last PCP and physical: 02/2014  Immunizations:UTD, declined Shingles due to immunocomprmised state/  Diet:Lean meats, very little fried food, has cut back on sweets. Does not eat fruits and veggies as much as he should.  Exercise: Walks, tries to work out two hours a day.  Colonoscopy: 02/16/2015 - no finding of concern.   Dentist: twice a year Opthalmology: Twice a year Cardiology: Twice a year Pulmonology: yearly Rheumatology: Twice a day   [name],  is in to be established. He has a history of lupus anticoagulant syndrome, hypercholesterolemia, coronary artery disease, multiple joint pain, osteoarthritis, glaucoma, GERD, and allergic rhinitis.   Has the following chronic problems that require follow up and concerns today:  Joint Pain - in the left shoulder. Comes and goes. Takes Glucosamine.   HTN - Feels as though it is controlled. From 130-112.  - Follows up with Cardiology In July  Had an exercise stress test recently, in which he reports " I failed. I became short of breath and had a little discomfort in my chest.    ROS negative for unless reported above: fevers, unintentional weight loss,struggling to breath, hemoptysis, melena, hematochezia, hematuria, falls, loc, si, thoughts of self harm  Has occasional "chest discomfort" when exercising. Wears hearing aid in left ear and wears glasses due to glaucoma.   Past Medical History  Diagnosis Date  . GERD (gastroesophageal reflux disease)   . Glaucoma   . Hypertension   . Hyperlipidemia   . CAD (coronary artery disease)     7 stents per the patient (no record).  Stent to 90% inferior branch of an OM and mild scattered plaque in other vessels [facility]  01/08/13  . Lupus   . AS (aortic stenosis)     Mild  . Pneumonitis     Lupus  . Hypercoagulable state   . Barrett's esophagus 11/11/2014    Past Surgical History  Procedure Laterality Date  .  Tonsillectomy and adenoidectomy    . Knee surgery      bilateral  . Cataract extraction      x 2  . Nasal sinus surgery    . Colonoscopy    . Upper gastrointestinal endoscopy      Family History  Problem Relation Age of Onset  . Diabetes Mother   . Hypertension Mother   . Arthritis Mother   . Diabetes Father   . Hypertension Father   . Arthritis Father   . CAD Father 54    died age 28    History   Social History  . Marital Status: Married    Spouse Name: N/A  . Number of Children: 2  . Years of Education: N/A   Social History Main Topics  . Smoking status: Never Smoker   . Smokeless tobacco: Never Used  . Alcohol Use: 3.5 oz/week    7 drink(s) per week  . Drug Use: No  . Sexual Activity: Not on file   Other Topics Concern  . None   Social History Narrative   Lives with wife.       Current outpatient prescriptions:  .  amLODipine (NORVASC) 5 MG tablet, Take 5 mg by mouth daily., Disp: , Rfl:  .  aspirin EC 325 MG tablet, Take 0.25 tablets by mouth daily. , Disp: , Rfl:  .  metoprolol tartrate (LOPRESSOR) 25 MG tablet, Take 1 tablet (25 mg total) by mouth 2 (  two) times daily. (Patient taking differently: Take by mouth 2 (two) times daily. ), Disp: 180 tablet, Rfl: 3 .  NASONEX 50 MCG/ACT nasal spray, Place 2 sprays into the nose daily. , Disp: , Rfl:  .  omeprazole (PRILOSEC) 40 MG capsule, TAKE 1 BY MOUTH DAILY, Disp: 90 capsule, Rfl: 3 .  ramipril (ALTACE) 5 MG capsule, Take 1 capsule (5 mg total) by mouth daily., Disp: 90 capsule, Rfl: 1 .  RANEXA 500 MG 12 hr tablet, Take 500 mg by mouth 2 (two) times daily. , Disp: , Rfl:  .  rosuvastatin (CRESTOR) 5 MG tablet, Take 1 tablet (5 mg total) by mouth daily. Only takes on M W F, Disp: 30 tablet, Rfl: 4 .  warfarin (COUMADIN) 4 MG tablet, Take as directed by anticoagulation clinic, Disp: 90 tablet, Rfl: 1  EXAM:  Filed Vitals:   02/28/15 0950  BP: 130/80  Temp: 97.6 F (36.4 C)    Body mass index is  25.26 kg/(m^2).  GENERAL: vitals reviewed and listed above, alert, oriented, appears well hydrated and in no acute distress  HEENT: atraumatic, conjunttiva clear, no obvious abnormalities on inspection of external nose and ears. Has tube in left ear and hearing aid in left ear. Wears glasses.   NECK: no obvious masses on inspection  LUNGS: clear to auscultation bilaterally, no wheezes, rales or rhonchi, good air movement  CV: Systolic murmur at apex that radiates. No clicks, no rubs. Carotid bruit in right.   MS: moves all extremities without noticeable abnormality. No edema noted. No redness,warmth, or abnormality.   Abd: soft/nontender/nondistended/normal bowel sounds. Has hiatal hernia, not incarcerated.   Skin: warm and dry, no rash . No abnormal appearing moles.   Neuro: CN II-XII intact, sensation and reflexes normal throughout, 5/5 muscle strength in bilateral upper and lower extremities. Normal finger to nose. Normal rapid alternating movements.  PSYCH: pleasant and cooperative, no obvious depression or anxiety  ASSESSMENT AND PLAN:  Discussed the following assessment and plan:  1. Routine adult health maintenance  - Basic metabolic panel; Future - Hemoglobin A1c; Future - Hepatic function panel; Future - Lipid panel; Future - POCT urinalysis dipstick; Future - PSA Total (Reflex To Free); Future - TSH; Future - CBC with Differential/Platelet; Future - Follow up in one year, or sooner if needed.  - Continue to exercise and eat a heart healthy diet.  - Declined shingles vaccination - The natural history of prostate cancer and ongoing controversy regarding screening and potential treatment outcomes of prostate cancer has been discussed with the patient. The meaning of a false positive PSA and a false negative PSA has been discussed. He indicates understanding of the limitations of this screening test and wishes to proceed with screening PSA testing.   2. Pain in joint,  upper arm, left - Does not want anything done for his shoulder pain at this time  3. Essential hypertension Blood pressure controlled on current regimen. Follows up with Cardiology on July 7th  4. Glaucoma - Followed by [facility] and Retina specialist for his glaucoma.    -We reviewed the PMH, PSH, FH, SH, Meds and Allergies. -We provided refills for any medications we will prescribe as needed. -We addressed current concerns per orders and patient instructions. -We have advised patient to follow up per instructions below. -Patient advised to return or notify a provider immediately if symptoms worsen or persist or new concerns arise.  There are no Patient Instructions on file for this visit.   Kandee Keenory  Demetry Bendickson, AGNP

## 2015-02-28 NOTE — Patient Instructions (Addendum)
It was great meeting you today! Follow up this week to have your fasting blood work done and I will let you know what the results are.  Continue to exercise and eat a heart healthy diet. Follow up with me in 1 year for your next CPE or sooner if needed.    Health Maintenance A healthy lifestyle and preventative care can promote health and wellness.  Maintain regular health, dental, and eye exams.  Eat a healthy diet. Foods like vegetables, fruits, whole grains, low-fat dairy products, and lean protein foods contain the nutrients you need and are low in calories. Decrease your intake of foods high in solid fats, added sugars, and salt. Get information about a proper diet from your health care provider, if necessary.  Regular physical exercise is one of the most important things you can do for your health. Most adults should get at least 150 minutes of moderate-intensity exercise (any activity that increases your heart rate and causes you to sweat) each week. In addition, most adults need muscle-strengthening exercises on 2 or more days a week.   Maintain a healthy weight. The body mass index (BMI) is a screening tool to identify possible weight problems. It provides an estimate of body fat based on height and weight. Your health care provider can find your BMI and can help you achieve or maintain a healthy weight. For males 20 years and older:  A BMI below 18.5 is considered underweight.  A BMI of 18.5 to 24.9 is normal.  A BMI of 25 to 29.9 is considered overweight.  A BMI of 30 and above is considered obese.  Maintain normal blood lipids and cholesterol by exercising and minimizing your intake of saturated fat. Eat a balanced diet with plenty of fruits and vegetables. Blood tests for lipids and cholesterol should begin at age 64 and be repeated every 5 years. If your lipid or cholesterol levels are high, you are over age 64, or you are at high risk for heart disease, you may need your  cholesterol levels checked more frequently.Ongoing high lipid and cholesterol levels should be treated with medicines if diet and exercise are not working.  If you smoke, find out from your health care provider how to quit. If you do not use tobacco, do not start.  Lung cancer screening is recommended for adults aged 55-80 years who are at high risk for developing lung cancer because of a history of smoking. A yearly low-dose CT scan of the lungs is recommended for people who have at least a 30-pack-year history of smoking and are current smokers or have quit within the past 15 years. A pack year of smoking is smoking an average of 1 pack of cigarettes a day for 1 year (for example, a 30-pack-year history of smoking could mean smoking 1 pack a day for 30 years or 2 packs a day for 15 years). Yearly screening should continue until the smoker has stopped smoking for at least 15 years. Yearly screening should be stopped for people who develop a health problem that would prevent them from having lung cancer treatment.  If you choose to drink alcohol, do not have more than 2 drinks per day. One drink is considered to be 12 oz (360 mL) of beer, 5 oz (150 mL) of wine, or 1.5 oz (45 mL) of liquor.  Avoid the use of street drugs. Do not share needles with anyone. Ask for help if you need support or instructions about stopping the use  of drugs.  High blood pressure causes heart disease and increases the risk of stroke. Blood pressure should be checked at least every 1-2 years. Ongoing high blood pressure should be treated with medicines if weight loss and exercise are not effective.  If you are 10-26 years old, ask your health care provider if you should take aspirin to prevent heart disease.  Diabetes screening involves taking a blood sample to check your fasting blood sugar level. This should be done once every 3 years after age 34 if you are at a normal weight and without risk factors for diabetes. Testing  should be considered at a younger age or be carried out more frequently if you are overweight and have at least 1 risk factor for diabetes.  Colorectal cancer can be detected and often prevented. Most routine colorectal cancer screening begins at the age of 34 and continues through age 41. However, your health care provider may recommend screening at an earlier age if you have risk factors for colon cancer. On a yearly basis, your health care provider may provide home test kits to check for hidden blood in the stool. A small camera at the end of a tube may be used to directly examine the colon (sigmoidoscopy or colonoscopy) to detect the earliest forms of colorectal cancer. Talk to your health care provider about this at age 28 when routine screening begins. A direct exam of the colon should be repeated every 5-10 years through age 74, unless early forms of precancerous polyps or small growths are found.  People who are at an increased risk for hepatitis B should be screened for this virus. You are considered at high risk for hepatitis B if:  You were born in a country where hepatitis B occurs often. Talk with your health care provider about which countries are considered high risk.  Your parents were born in a high-risk country and you have not received a shot to protect against hepatitis B (hepatitis B vaccine).  You have HIV or AIDS.  You use needles to inject street drugs.  You live with, or have sex with, someone who has hepatitis B.  You are a man who has sex with other men (MSM).  You get hemodialysis treatment.  You take certain medicines for conditions like cancer, organ transplantation, and autoimmune conditions.  Hepatitis C blood testing is recommended for all people born from [birth year] through 1965 and any individual with known risk factors for hepatitis C.  Healthy men should no longer receive prostate-specific antigen (PSA) blood tests as part of routine cancer screening. Talk to  your health care provider about prostate cancer screening.  Testicular cancer screening is not recommended for adolescents or adult males who have no symptoms. Screening includes self-exam, a health care provider exam, and other screening tests. Consult with your health care provider about any symptoms you have or any concerns you have about testicular cancer.  Practice safe sex. Use condoms and avoid high-risk sexual practices to reduce the spread of sexually transmitted infections (STIs).  You should be screened for STIs, including gonorrhea and chlamydia if:  You are sexually active and are younger than 24 years.  You are older than 24 years, and your health care provider tells you that you are at risk for this type of infection.  Your sexual activity has changed since you were last screened, and you are at an increased risk for chlamydia or gonorrhea. Ask your health care provider if you are at risk.  If you are at risk of being infected with HIV, it is recommended that you take a prescription medicine daily to prevent HIV infection. This is called pre-exposure prophylaxis (PrEP). You are considered at risk if:  You are a man who has sex with other men (MSM).  You are a heterosexual man who is sexually active with multiple partners.  You take drugs by injection.  You are sexually active with a partner who has HIV.  Talk with your health care provider about whether you are at high risk of being infected with HIV. If you choose to begin PrEP, you should first be tested for HIV. You should then be tested every 3 months for as long as you are taking PrEP.  Use sunscreen. Apply sunscreen liberally and repeatedly throughout the day. You should seek shade when your shadow is shorter than you. Protect yourself by wearing long sleeves, pants, a wide-brimmed hat, and sunglasses year round whenever you are outdoors.  Tell your health care provider of new moles or changes in moles, especially if  there is a change in shape or color. Also, tell your health care provider if a mole is larger than the size of a pencil eraser.  A one-time screening for abdominal aortic aneurysm (AAA) and surgical repair of large AAAs by ultrasound is recommended for men aged 75-75 years who are current or former smokers.  Stay current with your vaccines (immunizations). Document Released: 03/29/2008 Document Revised: 10/06/2013 Document Reviewed: 02/26/2011 [facility] Patient Information 2015 [place], [place]. This information is not intended to replace advice given to you by your health care provider. Make sure you discuss any questions you have with your health care provider.

## 2015-03-01 ENCOUNTER — Encounter: Payer: Self-pay | Admitting: Adult Health

## 2015-03-01 LAB — POCT URINALYSIS DIPSTICK
Bilirubin, UA: NEGATIVE
GLUCOSE UA: NEGATIVE
Ketones, UA: NEGATIVE
Leukocytes, UA: NEGATIVE
NITRITE UA: NEGATIVE
Protein, UA: NEGATIVE
RBC UA: NEGATIVE
Spec Grav, UA: 1.01
Urobilinogen, UA: 0.2
pH, UA: 6.5

## 2015-03-01 LAB — BASIC METABOLIC PANEL
BUN: 17 mg/dL (ref 6–23)
CALCIUM: 9.8 mg/dL (ref 8.4–10.5)
CO2: 31 mEq/L (ref 19–32)
Chloride: 100 mEq/L (ref 96–112)
Creatinine, Ser: 0.85 mg/dL (ref 0.40–1.50)
GFR: 96.46 mL/min (ref >60.00)
GLUCOSE: 94 mg/dL (ref 70–99)
POTASSIUM: 4.8 meq/L (ref 3.5–5.1)
Sodium: 138 mEq/L (ref 135–145)

## 2015-03-01 LAB — CBC WITH DIFFERENTIAL/PLATELET
BASOS PCT: 0.5 % (ref 0.0–3.0)
Basophils Absolute: 0 10*3/uL (ref 0.0–0.1)
EOS PCT: 3.2 % (ref 0.0–5.0)
Eosinophils Absolute: 0.2 10*3/uL (ref 0.0–0.7)
HCT: 39.6 % (ref 39.0–52.0)
Hemoglobin: 12.9 g/dL — ABNORMAL LOW (ref 13.0–17.0)
LYMPHS ABS: 1.9 10*3/uL (ref 0.7–4.0)
Lymphocytes Relative: 35.1 % (ref 12.0–46.0)
MCHC: 32.7 g/dL (ref 30.0–36.0)
MCV: 85.7 fl (ref 78.0–100.0)
MONOS PCT: 8.7 % (ref 3.0–12.0)
Monocytes Absolute: 0.5 10*3/uL (ref 0.1–1.0)
NEUTROS ABS: 2.9 10*3/uL (ref 1.4–7.7)
Neutrophils Relative %: 52.5 % (ref 43.0–77.0)
Platelets: 186 10*3/uL (ref 150.0–400.0)
RBC: 4.62 Mil/uL (ref 4.22–5.81)
RDW: 15.9 % — ABNORMAL HIGH (ref 11.5–15.5)
WBC: 5.6 10*3/uL (ref 4.0–10.5)

## 2015-03-01 LAB — LIPID PANEL
CHOLESTEROL: 138 mg/dL (ref 0–200)
HDL: 26.7 mg/dL — ABNORMAL LOW (ref >39.00)
LDL CALC: 77 mg/dL (ref 0–99)
NonHDL: 111.3
TRIGLYCERIDES: 171 mg/dL — AB (ref 0.0–149.0)
Total CHOL/HDL Ratio: 5
VLDL: 34.2 mg/dL (ref 0.0–40.0)

## 2015-03-01 LAB — HEMOGLOBIN A1C: HEMOGLOBIN A1C: 5.8 % (ref 4.6–6.5)

## 2015-03-01 LAB — HEPATIC FUNCTION PANEL
ALK PHOS: 24 U/L — AB (ref 39–117)
ALT: 22 U/L (ref 0–53)
AST: 26 U/L (ref 0–37)
Albumin: 4.2 g/dL (ref 3.5–5.2)
BILIRUBIN DIRECT: 0.1 mg/dL (ref 0.0–0.3)
BILIRUBIN TOTAL: 0.4 mg/dL (ref 0.2–1.2)
Total Protein: 7.1 g/dL (ref 6.0–8.3)

## 2015-03-01 LAB — TSH: TSH: 3.96 u[IU]/mL (ref 0.35–4.50)

## 2015-03-01 NOTE — Addendum Note (Signed)
Addended by: Alfred LevinsWYRICK, CINDY D on: 03/01/2015 08:10 AM   Modules accepted: Orders

## 2015-03-17 ENCOUNTER — Ambulatory Visit: Payer: PPO

## 2015-03-21 ENCOUNTER — Ambulatory Visit (INDEPENDENT_AMBULATORY_CARE_PROVIDER_SITE_OTHER): Payer: PPO | Admitting: General Practice

## 2015-03-21 DIAGNOSIS — D6862 Lupus anticoagulant syndrome: Secondary | ICD-10-CM | POA: Diagnosis not present

## 2015-03-21 DIAGNOSIS — Z5181 Encounter for therapeutic drug level monitoring: Secondary | ICD-10-CM | POA: Diagnosis not present

## 2015-03-21 LAB — POCT INR: INR: 2.6

## 2015-03-21 NOTE — Progress Notes (Signed)
Pre visit review using our clinic review tool, if applicable. No additional management support is needed unless otherwise documented below in the visit note. 

## 2015-03-22 ENCOUNTER — Telehealth: Payer: Self-pay | Admitting: Adult Health

## 2015-03-22 DIAGNOSIS — I10 Essential (primary) hypertension: Secondary | ICD-10-CM

## 2015-03-22 MED ORDER — RAMIPRIL 5 MG PO CAPS: 5.0000 mg | ORAL_CAPSULE | Freq: Every day | ORAL | Status: DC

## 2015-03-22 NOTE — Telephone Encounter (Signed)
Rx sent to pharmacy for 1 year.  

## 2015-03-22 NOTE — Telephone Encounter (Signed)
Pt request refill ramipril (ALTACE) 5 MG capsule 90 day [facility]

## 2015-03-24 ENCOUNTER — Telehealth: Payer: Self-pay | Admitting: Cardiology

## 2015-03-24 NOTE — Telephone Encounter (Signed)
Please add to my schedule next week.  Call Mr. Barry Dienes with the results and send results to Shirline Frees, NP

## 2015-03-24 NOTE — Telephone Encounter (Signed)
Please call,pt says he thinks he needs a cath. He says it is no emergency.

## 2015-03-24 NOTE — Telephone Encounter (Signed)
Spoke with patient who c/o chest tightness and SOB. He reports this occurs usually with exertion. He states his symptoms are occuring quicker during exercise than they had previously and he has noticed a change in the intensity and frequency of the symptoms in the last 2 months. He had a positive ETT in March and saw Dr. [place] who added amlodipine. Patient reports amlodipine isn't helping him as much now as it had in the beginning.   He would like to be scheduled for LHC or see Dr. Antoine Poche to discuss this.   He is aware to seek ED eval for worsening symptoms.

## 2015-03-25 NOTE — Telephone Encounter (Signed)
Pt. Added on Monday @ 9:45

## 2015-03-28 ENCOUNTER — Encounter: Payer: Self-pay | Admitting: Cardiology

## 2015-03-28 ENCOUNTER — Ambulatory Visit
Admission: RE | Admit: 2015-03-28 | Discharge: 2015-03-28 | Disposition: A | Discharge status: Home / Self Care | Payer: PPO | Source: Ambulatory Visit | Attending: Cardiology | Admitting: Cardiology

## 2015-03-28 ENCOUNTER — Ambulatory Visit (INDEPENDENT_AMBULATORY_CARE_PROVIDER_SITE_OTHER): Payer: PPO | Admitting: Cardiology

## 2015-03-28 VITALS — BP 130/86 | HR 67 | Ht 68.0 in | Wt 169.0 lb

## 2015-03-28 DIAGNOSIS — E7800 Pure hypercholesterolemia, unspecified: Secondary | ICD-10-CM

## 2015-03-28 DIAGNOSIS — Z01818 Encounter for other preprocedural examination: Secondary | ICD-10-CM

## 2015-03-28 DIAGNOSIS — I2510 Atherosclerotic heart disease of native coronary artery without angina pectoris: Secondary | ICD-10-CM | POA: Diagnosis not present

## 2015-03-28 DIAGNOSIS — R0789 Other chest pain: Secondary | ICD-10-CM

## 2015-03-28 DIAGNOSIS — D689 Coagulation defect, unspecified: Secondary | ICD-10-CM

## 2015-03-28 NOTE — Patient Instructions (Signed)
Your physician has requested that you have a cardiac catheterization. Cardiac catheterization is used to diagnose and/or treat various heart conditions. Doctors may recommend this procedure for a number of different reasons. The most common reason is to evaluate chest pain. Chest pain can be a symptom of coronary artery disease (CAD), and cardiac catheterization can show whether plaque is narrowing or blocking your heart's arteries. This procedure is also used to evaluate the valves, as well as measure the blood flow and oxygen levels in different parts of your heart. For further information please visit [email or web address] Please follow instruction sheet, as given.  Your physician recommends that you return for lab work  A chest x-ray takes a picture of the organs and structures inside the chest, including the heart, lungs, and blood vessels. This test can show several things, including, whether the heart is enlarges; whether fluid is building up in the lungs; and whether pacemaker / defibrillator leads are still in place.  Your physician has recommended you make the following change in your medication: HOLD Warfarin 5 days prior to Cath

## 2015-03-28 NOTE — Addendum Note (Signed)
Addended by: Barrie Dunker on: 03/28/2015 02:42 PM   Modules accepted: Orders

## 2015-03-28 NOTE — Progress Notes (Signed)
 HPI The patient presents for follow up of a history of coronary disease.  He has an extensive history of coronary disease and does report 7 stents in the past. (I don't have these records.)  He had a stress perfusion study in January 2015 at another institution. There was not any evidence of ischemia or infarct. He's had a well preserved ejection fraction. He did have an echo last year that demonstrated well-preserved ejection fraction and mild aortic stenosis. He had been having some exertional chest pain. I put him on a treadmill and he did have some changes. He was able to complete stage 3 of the Bruce protocol. He had 2/10 chest tightness. Peak HR 134. He did develop 2-2.5 mm ST depression in the inferolateral leads consistent with ischemia. These changes resolved in recovery and he is now pain free. The test was performed on metoprolol and Ranexa.  He was initially managed medically after this. However, he now comes back with increasing chest discomfort and decreased exercise tolerance. This has progressed over the weeks. He knows he would not be a make it to the same stage of a treadmill test without getting symptoms. This is similar when he had prior to require another stents.   Allergies  Allergen Reactions  . Sulfa Antibiotics     Makes patient jittery, prefers not to take them.     Current Outpatient Prescriptions  Medication Sig Dispense Refill  . amLODipine (NORVASC) 5 MG tablet Take 5 mg by mouth daily.    . aspirin EC 325 MG tablet Take 0.25 tablets by mouth daily.     . metoprolol tartrate (LOPRESSOR) 25 MG tablet Take 1 tablet (25 mg total) by mouth 2 (two) times daily. (Patient taking differently: Take by mouth 2 (two) times daily. ) 180 tablet 3  . Multiple Vitamin (MULTIVITAMIN WITH MINERALS) TABS tablet Take 1 tablet by mouth daily.    . NASONEX 50 MCG/ACT nasal spray Place 2 sprays into the nose daily.     . Omega-3 Fatty Acids (FISH OIL PO) Take by mouth.    . omeprazole  (PRILOSEC) 40 MG capsule TAKE 1 BY MOUTH DAILY 90 capsule 3  . ramipril (ALTACE) 5 MG capsule Take 1 capsule (5 mg total) by mouth daily. 90 capsule 3  . RANEXA 500 MG 12 hr tablet Take 500 mg by mouth 2 (two) times daily.     . rosuvastatin (CRESTOR) 5 MG tablet Take 1 tablet (5 mg total) by mouth daily. Only takes on M W F 30 tablet 4  . warfarin (COUMADIN) 4 MG tablet Take as directed by anticoagulation clinic 90 tablet 1   No current facility-administered medications for this visit.    Past Medical History  Diagnosis Date  . GERD (gastroesophageal reflux disease)   . Glaucoma   . Hypertension   . Hyperlipidemia   . CAD (coronary artery disease)     7 stents per the patient (no record).  Stent to 90% inferior branch of an OM and mild scattered plaque in other vessels [facility]  01/08/13  . Lupus   . AS (aortic stenosis)     Mild  . Pneumonitis     Lupus  . Hypercoagulable state   . Barrett's esophagus 11/11/2014  . Joint pain   . Uses hearing aid     Past Surgical History  Procedure Laterality Date  . Tonsillectomy and adenoidectomy    . Knee surgery      bilateral  . Cataract   extraction      x 2  . Nasal sinus surgery    . Colonoscopy    . Upper gastrointestinal endoscopy      ROS:  As stated in the HPI and negative for all other systems.  PHYSICAL EXAM BP 130/86 mmHg  Pulse 67  Ht 5' 8" (1.727 m)  Wt 169 lb (76.658 kg)  BMI 25.70 kg/m2 GENERAL:  Well appearing NECK:  No jugular venous distention, waveform within normal limits, carotid upstroke brisk and symmetric, possible left bruit, no thyromegaly LUNGS:  Clear to auscultation bilaterally BACK:  No CVA tenderness CHEST:  Unremarkable HEART:  PMI not displaced or sustained,S1 and S2 within normal limits, no S3, no S4, no clicks, no rubs, apical systolic murmur at the apex and slightly radiating, no diasotlic murmurs ABD:  Flat, positive bowel sounds normal in frequency in pitch, no bruits, no rebound, no  guarding, no midline pulsatile mass, no hepatomegaly, no splenomegaly EXT:  2 plus pulses left radial, decreased right radial, no edema, no cyanosis no clubbing.  EKG:  Sinus rhythm, rate 67, axis within normal limits, intervals within normal, no acute ST-T wave changes. 03/28/2015   ASSESSMENT AND PLAN  CAD:  He has increasing symptoms of an abnormal stress test.  He has at least class III symptoms.  Conservative therapy has failed. Therefore, cardiac catheterization is indicated. Note I cannot feel radial pulses and he has always had femoral approaches.  The patient understands that risks included but are not limited to stroke (1 in 1000), death (1 in 1000), kidney failure [usually temporary] (1 in 500), bleeding (1 in 200), allergic reaction [possibly serious] (1 in 200).  The patient understands and agrees to proceed.   HTN:  This is being treated in the context of managing his coronary disease.  DYSLIPIDEMIA:  I did check a lipid profile and the LDL was 39 in October of last year.  No change in therapy is indicated.  BRUIT:  He had only mild plaque.  No further work up is indicated.   AS:  This is mild. No change in therapy is indicated. We will follow this clinically.  HYPERCOAGUABLE:  He will of course come off his warfarin for the procedure. He does not need bridging.    

## 2015-03-29 LAB — TSH: TSH: 3.555 u[IU]/mL (ref 0.350–4.500)

## 2015-03-29 LAB — CBC
HCT: 38 % — ABNORMAL LOW (ref 39.0–52.0)
Hemoglobin: 12.2 g/dL — ABNORMAL LOW (ref 13.0–17.0)
MCH: 27.5 pg (ref 26.0–34.0)
MCHC: 32.1 g/dL (ref 30.0–36.0)
MCV: 85.6 fL (ref 78.0–100.0)
MPV: 10.5 fL (ref 8.6–12.4)
PLATELETS: 184 10*3/uL (ref 150–400)
RBC: 4.44 MIL/uL (ref 4.22–5.81)
RDW: 15.9 % — AB (ref 11.5–15.5)
WBC: 6.2 10*3/uL (ref 4.0–10.5)

## 2015-03-29 LAB — COMPREHENSIVE METABOLIC PANEL
ALT: 33 U/L (ref 0–53)
AST: 31 U/L (ref 0–37)
Albumin: 4 g/dL (ref 3.5–5.2)
Alkaline Phosphatase: 28 U/L — ABNORMAL LOW (ref 39–117)
BILIRUBIN TOTAL: 0.4 mg/dL (ref 0.2–1.2)
BUN: 12 mg/dL (ref 6–23)
CO2: 30 mEq/L (ref 19–32)
Calcium: 9.2 mg/dL (ref 8.4–10.5)
Chloride: 103 mEq/L (ref 96–112)
Creat: 0.84 mg/dL (ref 0.50–1.35)
GLUCOSE: 83 mg/dL (ref 70–99)
POTASSIUM: 5 meq/L (ref 3.5–5.3)
Sodium: 140 mEq/L (ref 135–145)
TOTAL PROTEIN: 7 g/dL (ref 6.0–8.3)

## 2015-03-29 LAB — PROTIME-INR
INR: 2.29 — ABNORMAL HIGH (ref <1.50)
Prothrombin Time: 25.2 seconds — ABNORMAL HIGH (ref 11.6–15.2)

## 2015-03-29 LAB — APTT: aPTT: 44 seconds — ABNORMAL HIGH (ref 24–37)

## 2015-04-01 ENCOUNTER — Encounter (HOSPITAL_COMMUNITY): Payer: Self-pay | Admitting: Cardiology

## 2015-04-01 ENCOUNTER — Ambulatory Visit (HOSPITAL_COMMUNITY)
Admission: RE | Admit: 2015-04-01 | Discharge: 2015-04-01 | Disposition: A | Discharge status: Home / Self Care | Payer: PPO | Source: Ambulatory Visit | Attending: Cardiology | Admitting: Cardiology

## 2015-04-01 ENCOUNTER — Encounter (HOSPITAL_COMMUNITY)
Admission: RE | Disposition: A | Discharge status: Home / Self Care | Payer: PPO | Source: Ambulatory Visit | Attending: Cardiology

## 2015-04-01 DIAGNOSIS — I350 Nonrheumatic aortic (valve) stenosis: Secondary | ICD-10-CM | POA: Diagnosis not present

## 2015-04-01 DIAGNOSIS — Z7901 Long term (current) use of anticoagulants: Secondary | ICD-10-CM | POA: Insufficient documentation

## 2015-04-01 DIAGNOSIS — Z9861 Coronary angioplasty status: Secondary | ICD-10-CM | POA: Insufficient documentation

## 2015-04-01 DIAGNOSIS — I209 Angina pectoris, unspecified: Secondary | ICD-10-CM

## 2015-04-01 DIAGNOSIS — M329 Systemic lupus erythematosus, unspecified: Secondary | ICD-10-CM | POA: Insufficient documentation

## 2015-04-01 DIAGNOSIS — E785 Hyperlipidemia, unspecified: Secondary | ICD-10-CM | POA: Diagnosis present

## 2015-04-01 DIAGNOSIS — I10 Essential (primary) hypertension: Secondary | ICD-10-CM | POA: Diagnosis present

## 2015-04-01 DIAGNOSIS — I25118 Atherosclerotic heart disease of native coronary artery with other forms of angina pectoris: Secondary | ICD-10-CM

## 2015-04-01 DIAGNOSIS — I2510 Atherosclerotic heart disease of native coronary artery without angina pectoris: Secondary | ICD-10-CM | POA: Diagnosis present

## 2015-04-01 DIAGNOSIS — Z01818 Encounter for other preprocedural examination: Secondary | ICD-10-CM

## 2015-04-01 DIAGNOSIS — Z7982 Long term (current) use of aspirin: Secondary | ICD-10-CM | POA: Diagnosis not present

## 2015-04-01 DIAGNOSIS — I25119 Atherosclerotic heart disease of native coronary artery with unspecified angina pectoris: Secondary | ICD-10-CM | POA: Insufficient documentation

## 2015-04-01 DIAGNOSIS — D6862 Lupus anticoagulant syndrome: Secondary | ICD-10-CM | POA: Diagnosis present

## 2015-04-01 DIAGNOSIS — R9439 Abnormal result of other cardiovascular function study: Secondary | ICD-10-CM | POA: Diagnosis present

## 2015-04-01 HISTORY — DX: Coronary angioplasty status: Z98.61

## 2015-04-01 HISTORY — DX: Atherosclerotic heart disease of native coronary artery without angina pectoris: I25.10

## 2015-04-01 HISTORY — DX: Essential (primary) hypertension: I10

## 2015-04-01 HISTORY — PX: CARDIAC CATHETERIZATION: SHX172

## 2015-04-01 LAB — PROTIME-INR
INR: 1.04 (ref 0.00–1.49)
Prothrombin Time: 13.8 seconds (ref 11.6–15.2)

## 2015-04-01 SURGERY — LEFT HEART CATH AND CORONARY ANGIOGRAPHY

## 2015-04-01 MED ORDER — SODIUM CHLORIDE 0.9 % IJ SOLN: 3.0000 mL | Freq: Two times a day (BID) | INTRAMUSCULAR | Status: DC

## 2015-04-01 MED ORDER — FENTANYL CITRATE (PF) 100 MCG/2ML IJ SOLN
INTRAMUSCULAR | Status: DC | PRN
  Administered 2015-04-01 (×2): 25 ug via INTRAVENOUS

## 2015-04-01 MED ORDER — HEPARIN (PORCINE) IN NACL 2-0.9 UNIT/ML-% IJ SOLN
INTRAMUSCULAR | Status: AC
  Filled 2015-04-01: qty 1500

## 2015-04-01 MED ORDER — NITROGLYCERIN 1 MG/10 ML FOR IR/CATH LAB
INTRA_ARTERIAL | Status: DC | PRN
  Administered 2015-04-01: 200 ug via INTRACORONARY

## 2015-04-01 MED ORDER — ASPIRIN 81 MG PO CHEW
CHEWABLE_TABLET | ORAL | Status: AC
  Filled 2015-04-01: qty 1

## 2015-04-01 MED ORDER — LIDOCAINE HCL (PF) 1 % IJ SOLN
INTRAMUSCULAR | Status: AC
  Filled 2015-04-01: qty 30

## 2015-04-01 MED ORDER — VERAPAMIL HCL 2.5 MG/ML IV SOLN
INTRAVENOUS | Status: AC
  Filled 2015-04-01: qty 2

## 2015-04-01 MED ORDER — FENTANYL CITRATE (PF) 100 MCG/2ML IJ SOLN
INTRAMUSCULAR | Status: AC
  Filled 2015-04-01: qty 2

## 2015-04-01 MED ORDER — MIDAZOLAM HCL 2 MG/2ML IJ SOLN
INTRAMUSCULAR | Status: DC | PRN
  Administered 2015-04-01 (×2): 1 mg via INTRAVENOUS

## 2015-04-01 MED ORDER — MORPHINE SULFATE 2 MG/ML IJ SOLN
1.0000 mg | INTRAMUSCULAR | Status: DC | PRN
Start: 2015-04-01 — End: 2015-04-01

## 2015-04-01 MED ORDER — MIDAZOLAM HCL 2 MG/2ML IJ SOLN
INTRAMUSCULAR | Status: AC
  Filled 2015-04-01: qty 2

## 2015-04-01 MED ORDER — ACETAMINOPHEN 325 MG PO TABS: 650.0000 mg | ORAL_TABLET | ORAL | Status: DC | PRN

## 2015-04-01 MED ORDER — SODIUM CHLORIDE 0.9 % WEIGHT BASED INFUSION: 3.0000 mL/kg/h | INTRAVENOUS | Status: AC

## 2015-04-01 MED ORDER — ONDANSETRON HCL 4 MG/2ML IJ SOLN: 4.0000 mg | Freq: Four times a day (QID) | INTRAMUSCULAR | Status: DC | PRN

## 2015-04-01 MED ORDER — SODIUM CHLORIDE 0.9 % IV SOLN: 250.0000 mL | INTRAVENOUS | Status: DC | PRN

## 2015-04-01 MED ORDER — SODIUM CHLORIDE 0.9 % IJ SOLN: 3.0000 mL | INTRAMUSCULAR | Status: DC | PRN

## 2015-04-01 MED ORDER — ASPIRIN 81 MG PO CHEW
81.0000 mg | CHEWABLE_TABLET | ORAL | Status: AC
  Administered 2015-04-01: 81 mg via ORAL

## 2015-04-01 MED ORDER — HEPARIN SODIUM (PORCINE) 1000 UNIT/ML IJ SOLN
INTRAMUSCULAR | Status: AC
  Filled 2015-04-01: qty 1

## 2015-04-01 MED ORDER — SODIUM CHLORIDE 0.9 % IV SOLN
INTRAVENOUS | Status: DC
  Administered 2015-04-01: 07:00:00 via INTRAVENOUS

## 2015-04-01 MED ORDER — IOHEXOL 350 MG/ML SOLN
INTRAVENOUS | Status: DC | PRN
  Administered 2015-04-01: 90 mL via INTRAVENOUS

## 2015-04-01 MED ORDER — NITROGLYCERIN 1 MG/10 ML FOR IR/CATH LAB
INTRA_ARTERIAL | Status: AC
  Filled 2015-04-01: qty 10

## 2015-04-01 MED ORDER — RANOLAZINE ER 500 MG PO TB12: 500.0000 mg | ORAL_TABLET | Freq: Two times a day (BID) | ORAL | Status: DC

## 2015-04-01 SURGICAL SUPPLY — 17 items
CATH INFINITI 5FR AL1 (CATHETERS) ×3 IMPLANT
CATH INFINITI 5FR ANG PIGTAIL (CATHETERS) ×3 IMPLANT
CATH INFINITI 5FR MULTPACK ANG (CATHETERS) ×3 IMPLANT
CATH OPTITORQUE TIG 4.0 5F (CATHETERS) IMPLANT
COVER PRB 48X5XTLSCP FOLD TPE (BAG) ×1 IMPLANT
COVER PROBE 5X48 (BAG) ×2
DEVICE RAD COMP TR BAND LRG (VASCULAR PRODUCTS) ×3 IMPLANT
GLIDESHEATH SLEND A-KIT 6F 22G (SHEATH) ×3 IMPLANT
KIT HEART LEFT (KITS) ×3 IMPLANT
PACK CARDIAC CATHETERIZATION (CUSTOM PROCEDURE TRAY) ×3 IMPLANT
SHEATH PINNACLE 5F 10CM (SHEATH) ×3 IMPLANT
SYR MEDRAD MARK V 150ML (SYRINGE) ×3 IMPLANT
TRANSDUCER W/STOPCOCK (MISCELLANEOUS) ×3 IMPLANT
TUBING CIL FLEX 10 FLL-RA (TUBING) ×3 IMPLANT
WIRE EMERALD 3MM-J .035X150CM (WIRE) ×3 IMPLANT
WIRE EMERALD ST .035X260CM (WIRE) ×3 IMPLANT
WIRE SAFE-T 1.5MM-J .035X260CM (WIRE) IMPLANT

## 2015-04-01 NOTE — Progress Notes (Signed)
Site area: rt groin Site Prior to Removal:  Level 0 Pressure Applied For:  20 minutes Manual:   yes Patient Status During Pull:  stable Post Pull Site:  Level  0 Post Pull Instructions Given:  yes Post Pull Pulses Present: yes Dressing Applied:  tegaderm Bedrest begins @ 0935 Comments: none

## 2015-04-01 NOTE — Interval H&P Note (Signed)
History and Physical Interval Note:  04/01/2015 7:41 AM  [name]  has presented today for surgery, with the diagnosis of Class III Angina with Abnormal GXT & a PMH of CAD s/p PCI.  Principal Problem:   Angina, class III Active Problems:   CAD S/P percutaneous coronary angioplasty: reports 7 stents   Abnormal stress electrocardiogram test using treadmill   Essential hypertension   Lupus anticoagulant with hypercoagulable state   Pure hypercholesterolemia   The various methods of treatment have been discussed with the patient and family. After consideration of risks, benefits and other options for treatment, the patient has consented to  Procedure(s): Left Heart Cath and Coronary Angiography (N/A) with Possible Percutaneous Coronary Intervention as a surgical intervention .    The patient's history has been reviewed, patient examined, no change in status, stable for surgery.  I have reviewed the patient's chart and labs.  Questions were answered to the patient's satisfaction.    Risks / Complications include, but not limited to: Death, MI, CVA/TIA, VF/VT (with defibrillation), Bradycardia (need for temporary pacer placement), contrast induced nephropathy, bleeding / bruising / hematoma / pseudoaneurysm, vascular or coronary injury (with possible emergent CT or Vascular Surgery), adverse medication reactions, infection.  Additional risks involving the use of radiation with the possibility of radiation burns and cancer were explained in detail.  The patient voices understanding and agree to proceed.     [name], [name]  Cath Lab Visit (complete for each Cath Lab visit)  Clinical Evaluation Leading to the Procedure:   ACS: No.  Non-ACS:    Anginal Classification: CCS III  Anti-ischemic medical therapy: Maximal Therapy (2 or more classes of medications)  Non-Invasive Test Results: Intermediate-risk stress test findings: cardiac mortality 1-3%/year  Prior CABG: No previous  CABG  APPROPRIATENESS OF USE Ischemic Symptoms? CCS III (Marked limitation of ordinary activity) Anti-ischemic Medical Therapy? Maximal Medical Therapy (2 or more classes of medications) Non-invasive Test Results? Intermediate-risk stress test findings: cardiac mortality 1-3%/year Prior CABG? No Previous CABG   Patient Information:   1-2V CAD, no prox LAD  A (8)  Indication: 17; Score: 8   Patient Information:   CTO of 1 vessel, no other CAD  A (7)  Indication: 27; Score: 7   Patient Information:   1V CAD with prox LAD  A (9)  Indication: 33; Score: 9   Patient Information:   2V-CAD with prox LAD  A (9)  Indication: 39; Score: 9   Patient Information:   3V-CAD without LMCA  A (9)  Indication: 45; Score: 9   Patient Information:   3V-CAD without LMCA With Abnormal LV systolic function  A (9)  Indication: 48; Score: 9   Patient Information:   LMCA-CAD  A (9)  Indication: 49; Score: 9   Patient Information:   2V-CAD with prox LAD PCI  A (7)  Indication: 62; Score: 7   Patient Information:   2V-CAD with prox LAD CABG  A (8)  Indication: 62; Score: 8   Patient Information:   3V-CAD without LMCA With Low CAD burden(i.e., 3 focal stenoses, low SYNTAX score) PCI  A (7)  Indication: 63; Score: 7   Patient Information:   3V-CAD without LMCA With Low CAD burden(i.e., 3 focal stenoses, low SYNTAX score) CABG  A (9)  Indication: 63; Score: 9   Patient Information:   3V-CAD without LMCA E06c - Intermediate-high CAD burden (i.e., multiple diffuse lesions, presence of CTO, or high SYNTAX score) PCI  U (  4)  Indication: 64; Score: 4   Patient Information:   3V-CAD without LMCA E06c - Intermediate-high CAD burden (i.e., multiple diffuse lesions, presence of CTO, or high SYNTAX score) CABG  A (9)  Indication: 64; Score: 9   Patient Information:   LMCA-CAD With Isolated LMCA stenosis  PCI  U (6)  Indication: 65; Score:  6   Patient Information:   LMCA-CAD With Isolated LMCA stenosis  CABG  A (9)  Indication: 65; Score: 9   Patient Information:   LMCA-CAD Additional CAD, low CAD burden (i.e., 1- to 2-vessel additional involvement, low SYNTAX score) PCI  U (5)  Indication: 66; Score: 5   Patient Information:   LMCA-CAD Additional CAD, low CAD burden (i.e., 1- to 2-vessel additional involvement, low SYNTAX score) CABG  A (9)  Indication: 66; Score: 9   Patient Information:   LMCA-CAD Additional CAD, intermediate-high CAD burden (i.e., 3-vessel involvement, presence of CTO, or high SYNTAX score) PCI  I (3)  Indication: 67; Score: 3   Patient Information:   LMCA-CAD Additional CAD, intermediate-high CAD burden (i.e., 3-vessel involvement, presence of CTO, or high SYNTAX score) CABG  A (9)  Indication: 67; Score: 9

## 2015-04-01 NOTE — Discharge Instructions (Signed)
Angiogram °An angiogram, also called angiography, is a procedure used to look at the blood vessels that carry blood to different parts of your body (arteries). In this procedure, dye is injected through a long, thin tube (catheter) into an artery. X-rays are then taken. The X-rays will show if there is a blockage or problem in a blood vessel.  °LET YOUR HEALTH CARE PROVIDER KNOW ABOUT: °· Any allergies you have, including allergies to shellfish or contrast dye.   °· All medicines you are taking, including vitamins, herbs, eye drops, creams, and over-the-counter medicines.   °· Previous problems you or members of your family have had with the use of anesthetics.   °· Any blood disorders you have.   °· Previous surgeries you have had. °· Any previous kidney problems or failure you have had.  °· Medical conditions you have.   °· Possibility of pregnancy, if this applies. °RISKS AND COMPLICATIONS °Generally, an angiogram is a safe procedure. However, as with any procedure, problems can occur. Possible problems include: °· Injury to the blood vessels, including rupture or bleeding. °· Infection or bruising at the catheter site. °· Allergic reaction to the dye or contrast used. °· Kidney damage from the dye or contrast used. °· Blood clots that can lead to a stroke or heart attack. °BEFORE THE PROCEDURE °· Do not eat or drink after midnight on the night before the procedure, or as directed by your health care provider.   °· Ask your health care provider if you may drink enough water to take any needed medicines the morning of the procedure.   °PROCEDURE °· You may be given a medicine to help you relax (sedative) before and during the procedure. This medicine is given through an IV access tube that is inserted into one of your veins.   °· The area where the catheter will be inserted will be washed and shaved. This is usually done in the groin but may be done in the fold of your arm (near your elbow) or in the wrist. °· A  medicine will be given to numb the area where the catheter will be inserted (local anesthetic). °· The catheter will be inserted with a guide wire into an artery. The catheter is guided by using a type of X-ray (fluoroscopy) to the blood vessel being examined.   °· Dye is then injected into the catheter, and X-rays are taken. The dye helps to show where any narrowing or blockages are located.   °AFTER THE PROCEDURE  °· If the procedure is done through the leg, you will be kept in bed lying flat for several hours. You will be instructed to not bend or cross your legs. °· The insertion site will be checked frequently. °· The pulse in your feet or wrist will be checked frequently. °· Additional blood tests, X-rays, and electrocardiography may be done.   °· You may need to stay in the hospital overnight for observation.   °Document Released: 07/11/2005 Document Revised: 10/06/2013 Document Reviewed: 03/04/2013 °[employer]® Patient Information ©2015 [employer]. This information is not intended to replace advice given to you by your health care provider. Make sure you discuss any questions you have with your health care provider. ° °

## 2015-04-01 NOTE — H&P (View-Only) (Signed)
HPI The patient presents for follow up of a history of coronary disease.  He has an extensive history of coronary disease and does report 7 stents in the past. (I don't have these records.)  He had a stress perfusion study in January 2015 at another institution. There was not any evidence of ischemia or infarct. He's had a well preserved ejection fraction. He did have an echo last year that demonstrated well-preserved ejection fraction and mild aortic stenosis. He had been having some exertional chest pain. I put him on a treadmill and he did have some changes. He was able to complete stage 3 of the Bruce protocol. He had 2/10 chest tightness. Peak HR 134. He did develop 2-2.5 mm ST depression in the inferolateral leads consistent with ischemia. These changes resolved in recovery and he is now pain free. The test was performed on metoprolol and Ranexa.  He was initially managed medically after this. However, he now comes back with increasing chest discomfort and decreased exercise tolerance. This has progressed over the weeks. He knows he would not be a make it to the same stage of a treadmill test without getting symptoms. This is similar when he had prior to require another stents.   Allergies  Allergen Reactions  . Sulfa Antibiotics     Makes patient jittery, prefers not to take them.     Current Outpatient Prescriptions  Medication Sig Dispense Refill  . amLODipine (NORVASC) 5 MG tablet Take 5 mg by mouth daily.    Marland Kitchen aspirin EC 325 MG tablet Take 0.25 tablets by mouth daily.     . metoprolol tartrate (LOPRESSOR) 25 MG tablet Take 1 tablet (25 mg total) by mouth 2 (two) times daily. (Patient taking differently: Take by mouth 2 (two) times daily. ) 180 tablet 3  . Multiple Vitamin (MULTIVITAMIN WITH MINERALS) TABS tablet Take 1 tablet by mouth daily.    Marland Kitchen NASONEX 50 MCG/ACT nasal spray Place 2 sprays into the nose daily.     . Omega-3 Fatty Acids (FISH OIL PO) Take by mouth.    Marland Kitchen omeprazole  (PRILOSEC) 40 MG capsule TAKE 1 BY MOUTH DAILY 90 capsule 3  . ramipril (ALTACE) 5 MG capsule Take 1 capsule (5 mg total) by mouth daily. 90 capsule 3  . RANEXA 500 MG 12 hr tablet Take 500 mg by mouth 2 (two) times daily.     . rosuvastatin (CRESTOR) 5 MG tablet Take 1 tablet (5 mg total) by mouth daily. Only takes on M W F 30 tablet 4  . warfarin (COUMADIN) 4 MG tablet Take as directed by anticoagulation clinic 90 tablet 1   No current facility-administered medications for this visit.    Past Medical History  Diagnosis Date  . GERD (gastroesophageal reflux disease)   . Glaucoma   . Hypertension   . Hyperlipidemia   . CAD (coronary artery disease)     7 stents per the patient (no record).  Stent to 90% inferior branch of an OM and mild scattered plaque in other vessels [facility]  01/08/13  . Lupus   . AS (aortic stenosis)     Mild  . Pneumonitis     Lupus  . Hypercoagulable state   . Barrett's esophagus 11/11/2014  . Joint pain   . Uses hearing aid     Past Surgical History  Procedure Laterality Date  . Tonsillectomy and adenoidectomy    . Knee surgery      bilateral  . Cataract  extraction      x 2  . Nasal sinus surgery    . Colonoscopy    . Upper gastrointestinal endoscopy      ROS:  As stated in the HPI and negative for all other systems.  PHYSICAL EXAM BP 130/86 mmHg  Pulse 67  Ht 5\' 8"  (1.727 m)  Wt 169 lb (76.658 kg)  BMI 25.70 kg/m2 GENERAL:  Well appearing NECK:  No jugular venous distention, waveform within normal limits, carotid upstroke brisk and symmetric, possible left bruit, no thyromegaly LUNGS:  Clear to auscultation bilaterally BACK:  No CVA tenderness CHEST:  Unremarkable HEART:  PMI not displaced or sustained,S1 and S2 within normal limits, no S3, no S4, no clicks, no rubs, apical systolic murmur at the apex and slightly radiating, no diasotlic murmurs ABD:  Flat, positive bowel sounds normal in frequency in pitch, no bruits, no rebound, no  guarding, no midline pulsatile mass, no hepatomegaly, no splenomegaly EXT:  2 plus pulses left radial, decreased right radial, no edema, no cyanosis no clubbing.  EKG:  Sinus rhythm, rate 67, axis within normal limits, intervals within normal, no acute ST-T wave changes. 03/28/2015   ASSESSMENT AND PLAN  CAD:  He has increasing symptoms of an abnormal stress test.  He has at least class III symptoms.  Conservative therapy has failed. Therefore, cardiac catheterization is indicated. Note I cannot feel radial pulses and he has always had femoral approaches.  The patient understands that risks included but are not limited to stroke (1 in 1000), death (1 in 1000), kidney failure [usually temporary] (1 in 500), bleeding (1 in 200), allergic reaction [possibly serious] (1 in 200).  The patient understands and agrees to proceed.   HTN:  This is being treated in the context of managing his coronary disease.  DYSLIPIDEMIA:  I did check a lipid profile and the LDL was 39 in October of last year.  No change in therapy is indicated.  BRUIT:  He had only mild plaque.  No further work up is indicated.   AS:  This is mild. No change in therapy is indicated. We will follow this clinically.  HYPERCOAGUABLE:  He will of course come off his warfarin for the procedure. He does not need bridging.

## 2015-04-04 MED FILL — Heparin Sodium (Porcine) 2 Unit/ML in Sodium Chloride 0.9%: INTRAMUSCULAR | Qty: 1500 | Status: AC

## 2015-04-04 MED FILL — Lidocaine HCl Local Preservative Free (PF) Inj 1%: INTRAMUSCULAR | Qty: 30 | Status: AC

## 2015-04-06 ENCOUNTER — Telehealth: Payer: Self-pay | Admitting: Cardiology

## 2015-04-06 ENCOUNTER — Other Ambulatory Visit: Payer: Self-pay | Admitting: *Deleted

## 2015-04-06 MED ORDER — ISOSORBIDE MONONITRATE ER 60 MG PO TB24: 60.0000 mg | ORAL_TABLET | Freq: Every day | ORAL | Status: DC

## 2015-04-06 NOTE — Telephone Encounter (Signed)
I spoke with the patient.  He wants to continue with medical management.  I did review his cath films.  The LAD is not amenable to PCI.  His symptoms are not such that I think that CABG is indicated.  He has started walking again and feels relatively well. I will add Imdur 60 mg daily.  Schedule follow up in our clinic in about one month.

## 2015-04-21 ENCOUNTER — Ambulatory Visit: Payer: PPO | Admitting: Cardiology

## 2015-05-05 ENCOUNTER — Ambulatory Visit (INDEPENDENT_AMBULATORY_CARE_PROVIDER_SITE_OTHER): Payer: PPO | Admitting: General Practice

## 2015-05-05 DIAGNOSIS — Z5181 Encounter for therapeutic drug level monitoring: Secondary | ICD-10-CM

## 2015-05-05 DIAGNOSIS — D6862 Lupus anticoagulant syndrome: Secondary | ICD-10-CM | POA: Diagnosis not present

## 2015-05-05 LAB — POCT INR: INR: 2.1

## 2015-05-05 NOTE — Progress Notes (Signed)
Pre visit review using our clinic review tool, if applicable. No additional management support is needed unless otherwise documented below in the visit note. 

## 2015-06-16 ENCOUNTER — Ambulatory Visit (INDEPENDENT_AMBULATORY_CARE_PROVIDER_SITE_OTHER): Payer: PPO | Admitting: General Practice

## 2015-06-16 DIAGNOSIS — D6862 Lupus anticoagulant syndrome: Secondary | ICD-10-CM | POA: Diagnosis not present

## 2015-06-16 DIAGNOSIS — Z5181 Encounter for therapeutic drug level monitoring: Secondary | ICD-10-CM | POA: Diagnosis not present

## 2015-06-16 LAB — POCT INR: INR: 2.5

## 2015-06-16 NOTE — Progress Notes (Signed)
Pre visit review using our clinic review tool, if applicable. No additional management support is needed unless otherwise documented below in the visit note. 

## 2015-07-08 ENCOUNTER — Ambulatory Visit (INDEPENDENT_AMBULATORY_CARE_PROVIDER_SITE_OTHER): Payer: PPO | Admitting: Cardiology

## 2015-07-08 ENCOUNTER — Encounter: Payer: Self-pay | Admitting: Cardiology

## 2015-07-08 VITALS — BP 112/78 | HR 76 | Ht 68.5 in | Wt 170.5 lb

## 2015-07-08 DIAGNOSIS — I25119 Atherosclerotic heart disease of native coronary artery with unspecified angina pectoris: Secondary | ICD-10-CM | POA: Diagnosis not present

## 2015-07-08 NOTE — Progress Notes (Signed)
HPI The patient presents for follow up of a history of coronary disease.  He has an extensive history of coronary disease and does report 7 stents in the past. (I don't have these records.)  He had a stress perfusion study in January 2015 at another institution. There was not any evidence of ischemia or infarct. He's had a well preserved ejection fraction. He did have an echo last year that demonstrated well-preserved ejection fraction and mild aortic stenosis in 2015. He had been having some exertional chest pain. I put him on a treadmill and he did have some changes. He was able to complete stage 3 of the Bruce protocol. He had 2/10 chest tightness. Peak HR 134. He did develop 2-2.5 mm ST depression in the inferolateral leads consistent with ischemia. These changes resolved in recovery and he is now pain free. The test was performed on metoprolol and Ranexa.  He was initially managed medically after this. However, with some recurrent symptoms he had a cardiac catheterization. He was found to have patent stents in the LAD, circumflex and RCA. However, he did have some 70% stenosis in his LAD but this was a narrow caliber vessel. I reviewed these results. It was decided to manage him medically.  Since that catheterization recently he has had one episode of chest discomfort while walking with his wife. He had to slow down and it improved. He is done better since her Imdur was started. He's not had any need for sublingual nitroglycerin. Is not having any resting symptoms. He denies any new shortness of breath, PND or orthopnea.  Allergies  Allergen Reactions  . Sulfa Antibiotics     Makes patient jittery, prefers not to take them.     Current Outpatient Prescriptions  Medication Sig Dispense Refill  . amLODipine (NORVASC) 5 MG tablet Take 5 mg by mouth daily.    Marland Kitchen aspirin EC 325 MG tablet Take 0.25 tablets by mouth daily.     . isosorbide mononitrate (IMDUR) 60 MG 24 hr tablet Take 1 tablet (60 mg  total) by mouth daily. 90 tablet 3  . metoprolol tartrate (LOPRESSOR) 25 MG tablet Take 1 tablet (25 mg total) by mouth 2 (two) times daily. (Patient taking differently: Take 12.5 mg by mouth 2 (two) times daily. ) 180 tablet 3  . Multiple Vitamin (MULTIVITAMIN WITH MINERALS) TABS tablet Take 1 tablet by mouth daily.    Marland Kitchen NASONEX 50 MCG/ACT nasal spray Place 2 sprays into the nose daily.     . Omega-3 Fatty Acids (FISH OIL PO) Take by mouth.    Marland Kitchen omeprazole (PRILOSEC) 40 MG capsule TAKE 1 BY MOUTH DAILY 90 capsule 3  . ramipril (ALTACE) 5 MG capsule Take 1 capsule (5 mg total) by mouth daily. 90 capsule 3  . RANEXA 500 MG 12 hr tablet Take 500 mg by mouth 2 (two) times daily.     . rosuvastatin (CRESTOR) 5 MG tablet Take 1 tablet (5 mg total) by mouth daily. Only takes on M W F (Patient taking differently: Take 5 mg by mouth every Monday, Wednesday, and Friday. ) 30 tablet 4  . warfarin (COUMADIN) 4 MG tablet Take as directed by anticoagulation clinic (Patient taking differently: Take 4 mg by mouth daily. ) 90 tablet 1   No current facility-administered medications for this visit.    Past Medical History  Diagnosis Date  . GERD (gastroesophageal reflux disease)   . Glaucoma   . Essential hypertension   . Hyperlipidemia   .  CAD S/P percutaneous coronary angioplasty     7 stents per the patient (no record).  Stent to 90% inferior branch of an OM and mild scattered plaque in other vessels [facility]  01/08/13.  Cath 2016 with patent stents. He did have 70% stenosis in a small LAD that was about 2 mm. Size  . Lupus   . AS (aortic stenosis)     Mild  . Pneumonitis     Lupus  . Hypercoagulable state   . Barrett's esophagus 11/11/2014  . Joint pain   . Uses hearing aid     Past Surgical History  Procedure Laterality Date  . Tonsillectomy and adenoidectomy    . Knee surgery      bilateral  . Cataract extraction      x 2  . Nasal sinus surgery    . Colonoscopy    . Upper  gastrointestinal endoscopy    . Cardiac catheterization N/A 04/01/2015    Procedure: Left Heart Cath and Coronary Angiography;  Surgeon: Marykay Lex, MD;  Location: [facility] INVASIVE CV LAB;  Service: Cardiovascular;  Laterality: N/A;    ROS:  As stated in the HPI and negative for all other systems.  PHYSICAL EXAM BP 112/78 mmHg  Pulse 76  Ht 5' 8.5" (1.74 m)  Wt 170 lb 8 oz (77.338 kg)  BMI 25.54 kg/m2 GENERAL:  Well appearing NECK:  No jugular venous distention, waveform within normal limits, carotid upstroke brisk and symmetric, possible left bruit, no thyromegaly LUNGS:  Clear to auscultation bilaterally BACK:  No CVA tenderness CHEST:  Unremarkable HEART:  PMI not displaced or sustained,S1 and S2 within normal limits, no S3, no S4, no clicks, no rubs, apical systolic murmur at the apex and slightly radiating, no diasotlic murmurs ABD:  Flat, positive bowel sounds normal in frequency in pitch, no bruits, no rebound, no guarding, no midline pulsatile mass, no hepatomegaly, no splenomegaly EXT:  2 plus pulses left radial, decreased right radial, no edema, no cyanosis no clubbing.   ASSESSMENT AND PLAN  CAD:   He has disease as described. This is to be managed medically.  For now I will continue the meds as listed. He has any increasing symptoms we could increase his Imdur. Ultimately we could consider bypass although the LAD is a small vessel.  HTN:  This is being treated in the context of managing his coronary disease.  DYSLIPIDEMIA:  I did check a lipid profile and the LDL was 77 earlier this year.  No change in therapy is indicated.  BRUIT:  He had only mild plaque.  No further work up is indicated.   AS:  This is mild. No change in therapy is indicated. We will follow this clinically.  HYPERCOAGUABLE:  He will of course come off his warfarin for the procedure. He does not need bridging.  Because of his extensive disease he is on both aspirin and warfarin  Cath report reviewed.

## 2015-07-08 NOTE — Patient Instructions (Signed)
Your physician wants you to follow-up in: 4 Months. You will receive a reminder letter in the mail two months in advance. If you don't receive a letter, please call our office to schedule the follow-up appointment.  

## 2015-07-09 ENCOUNTER — Emergency Department (HOSPITAL_COMMUNITY): Payer: PPO

## 2015-07-09 ENCOUNTER — Encounter (HOSPITAL_COMMUNITY): Payer: Self-pay

## 2015-07-09 DIAGNOSIS — R011 Cardiac murmur, unspecified: Secondary | ICD-10-CM | POA: Diagnosis not present

## 2015-07-09 DIAGNOSIS — E785 Hyperlipidemia, unspecified: Secondary | ICD-10-CM | POA: Diagnosis not present

## 2015-07-09 DIAGNOSIS — I2510 Atherosclerotic heart disease of native coronary artery without angina pectoris: Secondary | ICD-10-CM | POA: Diagnosis not present

## 2015-07-09 DIAGNOSIS — K219 Gastro-esophageal reflux disease without esophagitis: Secondary | ICD-10-CM | POA: Diagnosis not present

## 2015-07-09 DIAGNOSIS — Z79899 Other long term (current) drug therapy: Secondary | ICD-10-CM | POA: Diagnosis not present

## 2015-07-09 DIAGNOSIS — Z7982 Long term (current) use of aspirin: Secondary | ICD-10-CM | POA: Insufficient documentation

## 2015-07-09 DIAGNOSIS — I10 Essential (primary) hypertension: Secondary | ICD-10-CM | POA: Diagnosis not present

## 2015-07-09 DIAGNOSIS — R079 Chest pain, unspecified: Secondary | ICD-10-CM | POA: Insufficient documentation

## 2015-07-09 LAB — BASIC METABOLIC PANEL
Anion gap: 9 (ref 5–15)
BUN: 13 mg/dL (ref 6–20)
CO2: 28 mmol/L (ref 22–32)
CREATININE: 0.95 mg/dL (ref 0.61–1.24)
Calcium: 9.1 mg/dL (ref 8.9–10.3)
Chloride: 103 mmol/L (ref 101–111)
GFR calc Af Amer: >60 mL/min (ref >60)
GFR calc non Af Amer: >60 mL/min (ref >60)
Glucose, Bld: 97 mg/dL (ref 65–99)
POTASSIUM: 4.3 mmol/L (ref 3.5–5.1)
Sodium: 140 mmol/L (ref 135–145)

## 2015-07-09 LAB — CBC
HCT: 35.7 % — ABNORMAL LOW (ref 39.0–52.0)
Hemoglobin: 11.7 g/dL — ABNORMAL LOW (ref 13.0–17.0)
MCH: 27.9 pg (ref 26.0–34.0)
MCHC: 32.8 g/dL (ref 30.0–36.0)
MCV: 85.2 fL (ref 78.0–100.0)
PLATELETS: 180 10*3/uL (ref 150–400)
RBC: 4.19 MIL/uL — AB (ref 4.22–5.81)
RDW: 16.1 % — AB (ref 11.5–15.5)
WBC: 5.2 10*3/uL (ref 4.0–10.5)

## 2015-07-09 LAB — PROTIME-INR
INR: 2.33 — ABNORMAL HIGH (ref 0.00–1.49)
PROTHROMBIN TIME: 25.3 s — AB (ref 11.6–15.2)

## 2015-07-09 LAB — I-STAT TROPONIN, ED: TROPONIN I, POC: 0 ng/mL (ref 0.00–0.08)

## 2015-07-09 NOTE — ED Notes (Signed)
Pt reports chest pressure across his chest when he was walking around in a pool. Pain worse with ambulation. He states his CP has subsided but he did take one aspirin tablet and one SL nitro about 30 minutes ago. Hx of 7 stents. He says he has a blockage now but was not able to get a stent bu is unsure why.

## 2015-07-10 ENCOUNTER — Emergency Department (HOSPITAL_COMMUNITY)
Admission: EM | Admit: 2015-07-10 | Discharge: 2015-07-10 | Disposition: A | Discharge status: Home / Self Care | Payer: PPO | Attending: Emergency Medicine | Admitting: Emergency Medicine

## 2015-07-10 DIAGNOSIS — R079 Chest pain, unspecified: Secondary | ICD-10-CM

## 2015-07-10 LAB — TROPONIN I: Troponin I: <0.03 ng/mL (ref <0.031)

## 2015-07-10 NOTE — ED Provider Notes (Signed)
CSN: [id number]     Arrival date & time 07/09/15  2224 History  By signing my name below, I, Sonum Patel, attest that this documentation has been prepared under the direction and in the presence of Azalia Bilis, MD. Electronically Signed: Sonum Patel, Neurosurgeon. 07/10/2015. 1:51 AM.    Chief Complaint  Patient presents with  . Chest Pain    The history is provided by the patient. No language interpreter was used.     HPI Comments: [name] is a 64 y.o. male with past medical history of CAD, HLD, who presents to the Emergency Department complaining of intermittent episodes of chest tightness that began about 14 hours ago. Patient states he was walking in a swimming pool when the chest tightness began. He reports using an aerobic machine prior to that for 20 min without symptoms. He reports later walking in a parking lot with more episodes of the chest tightness. He states he was able to feel the tightness upon deep breathing earlier but states this has since resolved. He took an ASA and NTG earlier today without significant relief. He reports 2 hours of daily exercise without any symptoms until today. 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 Patient had an abnormal stress test in 2015 which led to cardiac catheterization which demonstrated some progressive CAD. At that time the decision was made to manage it medically.  He is on Imdur as he always has some degree of angina  Cardiologist Rollene Rotunda   Past Medical History  Diagnosis Date  . GERD (gastroesophageal reflux disease)   . Glaucoma   . Essential hypertension   . Hyperlipidemia   . CAD S/P percutaneous coronary angioplasty     7 stents per the patient (no record).  Stent to 90% inferior branch of an OM and mild scattered plaque in other vessels [facility]  01/08/13.  Cath 2016 with patent stents. He did have 70% stenosis in a small LAD that was about 2 mm. Size  . Lupus   . AS (aortic stenosis)      Mild  . Pneumonitis     Lupus  . Hypercoagulable state   . Barrett's esophagus 11/11/2014  . Joint pain   . Uses hearing aid    Past Surgical History  Procedure Laterality Date  . Tonsillectomy and adenoidectomy    . Knee surgery      bilateral  . Cataract extraction      x 2  . Nasal sinus surgery    . Colonoscopy    . Upper gastrointestinal endoscopy    . Cardiac catheterization N/A 04/01/2015    Procedure: Left Heart Cath and Coronary Angiography;  Surgeon: Marykay Lex, MD;  Location: [facility] INVASIVE CV LAB;  Service: Cardiovascular;  Laterality: N/A;   Family History  Problem Relation Age of Onset  . Diabetes Mother   . Hypertension Mother   . Arthritis Mother   . Diabetes Father   . Hypertension Father   . Arthritis Father   . CAD Father 47    died age 56 of MI   Social History  Substance Use Topics  . Smoking status: Never Smoker   . Smokeless tobacco: Never Used  . Alcohol Use: 3.5 oz/week    7 Standard drinks or equivalent per week    Review of Systems  Respiratory: Positive for chest tightness. Negative for shortness of breath.   Cardiovascular: Positive for chest pain.  Gastrointestinal: Negative for nausea  and vomiting.  All other systems reviewed and are negative.     Allergies  Sulfa antibiotics  Home Medications   Prior to Admission medications   Medication Sig Start Date End Date Taking? Authorizing Provider  amLODipine (NORVASC) 5 MG tablet Take 5 mg by mouth daily.    Historical Provider, MD  aspirin EC 325 MG tablet Take 0.25 tablets by mouth daily.     Historical Provider, MD  isosorbide mononitrate (IMDUR) 60 MG 24 hr tablet Take 1 tablet (60 mg total) by mouth daily. 04/06/15   Rollene Rotunda, MD  metoprolol tartrate (LOPRESSOR) 25 MG tablet Take 1 tablet (25 mg total) by mouth 2 (two) times daily. Patient taking differently: Take 12.5 mg by mouth 2 (two) times daily.  12/02/14   Rollene Rotunda, MD  Multiple Vitamin (MULTIVITAMIN WITH  MINERALS) TABS tablet Take 1 tablet by mouth daily.    Historical Provider, MD  NASONEX 50 MCG/ACT nasal spray Place 2 sprays into the nose daily.  12/29/13   Historical Provider, MD  Omega-3 Fatty Acids (FISH OIL PO) Take by mouth.    Historical Provider, MD  omeprazole (PRILOSEC) 40 MG capsule TAKE 1 BY MOUTH DAILY 11/16/14   Eulis Foster, FNP  ramipril (ALTACE) 5 MG capsule Take 1 capsule (5 mg total) by mouth daily. 03/22/15   Shirline Frees, NP  RANEXA 500 MG 12 hr tablet Take 500 mg by mouth 2 (two) times daily.  01/12/14   Historical Provider, MD  rosuvastatin (CRESTOR) 5 MG tablet Take 1 tablet (5 mg total) by mouth daily. Only takes on M W F Patient taking differently: Take 5 mg by mouth every Monday, Wednesday, and Friday.  12/13/14   Rollene Rotunda, MD  warfarin (COUMADIN) 4 MG tablet Take as directed by anticoagulation clinic Patient taking differently: Take 4 mg by mouth daily.  01/11/15   Eulis Foster, FNP   BP 145/90 mmHg  Pulse 70  Temp(Src) 97.8 F (36.6 C) (Oral)  Resp 16  Ht 5' 8.5" (1.74 m)  Wt 170 lb (77.111 kg)  BMI 25.47 kg/m2  SpO2 98% Physical Exam  Constitutional: He is oriented to person, place, and time. He appears well-developed and well-nourished.  HENT:  Head: Normocephalic and atraumatic.  Eyes: EOM are normal.  Neck: Normal range of motion.  Cardiovascular: Normal rate, regular rhythm, normal heart sounds and intact distal pulses.   Systolic ejection murmer   Pulmonary/Chest: Effort normal and breath sounds normal. No respiratory distress.  Abdominal: Soft. He exhibits no distension. There is no tenderness.  Musculoskeletal: Normal range of motion.  Neurological: He is alert and oriented to person, place, and time.  Skin: Skin is warm and dry.  Psychiatric: He has a normal mood and affect. Judgment normal.  Nursing note and vitals reviewed.   ED Course  Procedures (including critical care time)  DIAGNOSTIC STUDIES: Oxygen Saturation is 98% on RA,  normal by my interpretation.    COORDINATION OF CARE: 1:59 AM Discussed treatment plan with pt at bedside and pt agreed to plan.   Labs Review Labs Reviewed  CBC - Abnormal; Notable for the following:    RBC 4.19 (*)    Hemoglobin 11.7 (*)    HCT 35.7 (*)    RDW 16.1 (*)    All other components within normal limits  PROTIME-INR - Abnormal; Notable for the following:    Prothrombin Time 25.3 (*)    INR 2.33 (*)    All other components within  normal limits  BASIC METABOLIC PANEL  TROPONIN I  Rosezena Sensor, ED    Imaging Review Dg Chest 2 View  07/09/2015   CLINICAL DATA:  Midsternal chest pain, pressure, and tightness today after walking.  EXAM: CHEST  2 VIEW  COMPARISON:  03/28/2015  FINDINGS: Normal heart size and pulmonary vascularity. Linear scarring in the lung bases is unchanged since prior study. Coronary artery calcifications versus stents. No focal consolidation in the lungs. No blunting of costophrenic angles. No pneumothorax. Mediastinal contours appear intact. Calcification of the aorta.  IMPRESSION: Linear scarring in the lung bases. Coronary artery stents and calcification. No evidence of active pulmonary disease. No change since prior study.   Electronically Signed   By: Burman Nieves M.D.   On: 07/09/2015 23:31   I have personally reviewed and evaluated these images and lab results as part of my medical decision-making.   EKG Interpretation # 1  Date/Time:  Saturday July 09 2015 22:32:00 EDT Ventricular Rate:  70 PR Interval:  222 QRS Duration: 100 QT Interval:  404 QTC Calculation: 436 R Axis:   18 Text Interpretation:  Sinus rhythm with 1st degree A-V block Otherwise  normal ECG No old tracing to compare Confirmed by CAMPOS  MD, KEVIN  ([id number]) on 07/10/2015 2:57:53 AM    EKG Interpretation #2  Date/Time:  Sunday July 10 2015 02:19:25 EDT Ventricular Rate:  66 PR Interval:  245 QRS Duration: 104 QT Interval:  422 QTC Calculation: 442 R  Axis:   49 Text Interpretation:  Sinus rhythm Prolonged PR interval Probable anteroseptal infarct, old No significant change was found Confirmed by CAMPOS  MD, KEVIN ([id number]) on 07/10/2015 2:58:54 AM         MDM   Final diagnoses:  Chest pain, unspecified chest pain type    I personally ambulated briskly to with the patient around the emergency department.  He had no chest pain shortness of breath.  He feels much better this time.  EKG 2 and troponin 2 are without acute abnormality.  His pain is somewhat worse with deep breathing.  My suspicion for pulmonary embolism however is low.  I believe the patient is safe for discharge home at this time with close cardiology follow-up.  Patient understands to follow-up with his cardiologist.  He understands to return to the ER for new or worsening symptoms.  I personally performed the services described in this documentation, which was scribed in my presence. The recorded information has been reviewed and is accurate.      Azalia Bilis, MD 07/11/15 [phone]

## 2015-07-10 NOTE — Discharge Instructions (Signed)

## 2015-07-13 ENCOUNTER — Encounter: Payer: Self-pay | Admitting: Physician Assistant

## 2015-07-13 ENCOUNTER — Ambulatory Visit (INDEPENDENT_AMBULATORY_CARE_PROVIDER_SITE_OTHER): Payer: PPO | Admitting: Physician Assistant

## 2015-07-13 VITALS — BP 115/69 | HR 78 | Ht 68.5 in | Wt 170.0 lb

## 2015-07-13 DIAGNOSIS — I209 Angina pectoris, unspecified: Secondary | ICD-10-CM

## 2015-07-13 NOTE — Progress Notes (Addendum)
Cardiology Office Note   Date:  07/13/2015   ID:  STANISLAW Keith, DOB 09/22/1951, MRN [id number]  PCP:  Shirline Frees, NP  Cardiologist:  Dr Luna Kitchens, PA-C   Chief Complaint  Patient presents with  . Follow-up    ER over the weekend..had EKG and lab work done/ saw Dr. Antoine Poche 9/23//pt states he had Chest pain in middle of chest, felt like a bruise  . Lupus  . Shortness of Breath    on exertion    History of Present Illness: [name] is a 64 y.o. male with a history of previously placed stents to his LAD, circumflex, and RCA. At his last cath, his LAD had a 70% stenosis to be treated medically. It is a 2 mm vessel. He also has mild AS, lupus, GERD, DVT/PE on Coumadin, hypertension and hyperlipidemia  [name] presents for follow-up after an ER visit this previous weekend for chest pain.  [name] had some episodes of chest tightness with exertion on Saturday. Chest tightness would come and go. He took an aspirin and sublingual nitroglycerin. He denies associated symptoms and states he was not sure if the chest pain was from his lupus or from his heart. He was a little atypical for him but did remind him of his previous angina.  He has not had chest pain since Saturday. He has been able to exercise and do almost his usual workout without any symptoms. His usual workout involves 90 minutes of weights up to 100 pounds and 30 minutes of cardio. He cut back a little bit yesterday but had no symptoms.  Prior to the episode on Saturday, he was not having consistent chest pain with exertion. He did not feel he was exerting himself more than usual, in fact his exertion level was milder than most days. He works hard at maintaining a healthy lifestyle and is very conscious of eating a low-fat diet.   Past Medical History  Diagnosis Date  . GERD (gastroesophageal reflux disease)   . Glaucoma   . Essential hypertension   . Hyperlipidemia   . CAD S/P percutaneous  coronary angioplasty     7 stents per the patient (no record).  Stent to 90% inferior branch of an OM and mild scattered plaque in other vessels [facility]  01/08/13.  Cath 2016 with patent stents. He did have 70% stenosis in a small LAD that was about 2 mm. Size  . Lupus   . AS (aortic stenosis)     Mild  . Pneumonitis     Lupus  . Hypercoagulable state   . Barrett's esophagus 11/11/2014  . Joint pain   . Uses hearing aid     Past Surgical History  Procedure Laterality Date  . Tonsillectomy and adenoidectomy    . Knee surgery      bilateral  . Cataract extraction      x 2  . Nasal sinus surgery    . Colonoscopy    . Upper gastrointestinal endoscopy    . Cardiac catheterization N/A 04/01/2015    Procedure: Left Heart Cath and Coronary Angiography;  Surgeon: Marykay Lex, MD;  Location: [facility] INVASIVE CV LAB;  Service: Cardiovascular;  Laterality: N/A;    Current Outpatient Prescriptions  Medication Sig Dispense Refill  . amLODipine (NORVASC) 5 MG tablet Take 5 mg by mouth daily.    Marland Kitchen aspirin EC 325 MG tablet Take 0.5 tablets by mouth daily.     Marland Kitchen  isosorbide mononitrate (IMDUR) 60 MG 24 hr tablet Take 1 tablet (60 mg total) by mouth daily. 90 tablet 3  . latanoprost (XALATAN) 0.005 % ophthalmic solution Place 1 drop into the right eye at bedtime.    . metoprolol tartrate (LOPRESSOR) 25 MG tablet Take 1 tablet (25 mg total) by mouth 2 (two) times daily. (Patient taking differently: Take 12.5 mg by mouth 2 (two) times daily. ) 180 tablet 3  . Multiple Vitamin (MULTIVITAMIN WITH MINERALS) TABS tablet Take 1 tablet by mouth daily.    Marland Kitchen NASONEX 50 MCG/ACT nasal spray Place 2 sprays into the nose daily.     . Omega-3 Fatty Acids (FISH OIL PO) Take 1 capsule by mouth daily.     Marland Kitchen omeprazole (PRILOSEC) 40 MG capsule TAKE 1 BY MOUTH DAILY 90 capsule 3  . ramipril (ALTACE) 5 MG capsule Take 1 capsule (5 mg total) by mouth daily. 90 capsule 3  . RANEXA 500 MG 12 hr tablet Take 500 mg by  mouth 2 (two) times daily.     . rosuvastatin (CRESTOR) 5 MG tablet Take 1 tablet (5 mg total) by mouth daily. Only takes on M W F (Patient taking differently: Take 5 mg by mouth every Monday, Wednesday, and Friday. ) 30 tablet 4  . warfarin (COUMADIN) 4 MG tablet Take as directed by anticoagulation clinic (Patient taking differently: Take 4 mg by mouth daily. ) 90 tablet 1   No current facility-administered medications for this visit.    Allergies:   Sulfa antibiotics    Social History:  The patient  reports that he has never smoked. He has never used smokeless tobacco. He reports that he drinks about 3.5 oz of alcohol per week. He reports that he does not use illicit drugs.   Family History:  The patient's family history includes Arthritis in his father and mother; CAD (age of onset: 54) in his father; Diabetes in his father and mother; Hypertension in his father and mother.    ROS:  Please see the history of present illness. All other systems are reviewed and negative.    PHYSICAL EXAM: VS:  BP 115/69 mmHg  Pulse 78  Ht 5' 8.5" (1.74 m)  Wt 170 lb (77.111 kg)  BMI 25.47 kg/m2 , BMI Body mass index is 25.47 kg/(m^2). GEN: Well nourished, well developed, male in no acute distress HEENT: normal Neck: no JVD, murmur radiates to carotids, no masses Cardiac: RRR; 2/6 murmur, no rubs, or gallops,no edema  Respiratory:  clear to auscultation bilaterally, normal work of breathing GI: soft, nontender, nondistended, + BS MS: no deformity or atrophy Skin: warm and dry, no rash Neuro:  Strength and sensation are intact Psych: euthymic mood, full affect   EKG:  EKG is not ordered today.  Recent Labs: 03/28/2015: ALT 33; TSH 3.555 07/09/2015: BUN 13; Creatinine, Ser 0.95; Hemoglobin 11.7*; Platelets 180; Potassium 4.3; Sodium 140    Lipid Panel    Component Value Date/Time   CHOL 138 03/01/2015 0809   TRIG 171.0* 03/01/2015 0809   HDL 26.70* 03/01/2015 0809   CHOLHDL 5 03/01/2015  0809   VLDL 34.2 03/01/2015 0809   LDLCALC 77 03/01/2015 0809     Wt Readings from Last 3 Encounters:  07/13/15 170 lb (77.111 kg)  07/09/15 170 lb (77.111 kg)  07/08/15 170 lb 8 oz (77.338 kg)     Other studies Reviewed: Additional studies/ records that were reviewed today include: Hospital records, office notes, previous cath report and labs.  ASSESSMENT AND PLAN:  1.  Chest pain: Discussed with Mr. Mcphee his coronary anatomy. Explained to him that the vessel they were looking at was a very small vessel and although it has a partial blockage, it does not supply a significant amount of heart muscle. He is encouraged to continue exercising as his body will tolerate. His blood pressure and heart rate are well controlled. Advised him that if he continues to get symptoms he thinks her angina, we can increase his Imdur or increase his Ranexa.  At this time, he feels he is doing okay and does not wish to increase either one. Discussed an extremely low-fat diet with less than 10% of calories from fat. He will consider this but I also advised him that it would be extremely difficult to maintain and the data that said it helped were fairly weak. He will think about it.  Current medicines are reviewed at length with the patient today.  The patient does not have concerns regarding medicines.  The following changes have been made:  no change  Labs/ tests ordered today include:  No orders of the defined types were placed in this encounter.     Disposition:   FU with Dr Antoine Poche   Signed, Leanna Battles  07/13/2015 10:57 AM    [facility] [address] [place], [place], [place]  [address] Phone: [phone]; Fax: [phone]

## 2015-07-13 NOTE — Patient Instructions (Signed)
You are schedule to see Dr Antoine Poche in January

## 2015-07-16 ENCOUNTER — Other Ambulatory Visit: Payer: Self-pay | Admitting: Cardiology

## 2015-07-28 ENCOUNTER — Ambulatory Visit (INDEPENDENT_AMBULATORY_CARE_PROVIDER_SITE_OTHER): Payer: PPO | Admitting: General Practice

## 2015-07-28 DIAGNOSIS — Z23 Encounter for immunization: Secondary | ICD-10-CM

## 2015-07-28 DIAGNOSIS — D6862 Lupus anticoagulant syndrome: Secondary | ICD-10-CM

## 2015-07-28 DIAGNOSIS — Z5181 Encounter for therapeutic drug level monitoring: Secondary | ICD-10-CM | POA: Diagnosis not present

## 2015-07-28 LAB — POCT INR: INR: 4

## 2015-07-28 NOTE — Progress Notes (Signed)
Pre visit review using our clinic review tool, if applicable. No additional management support is needed unless otherwise documented below in the visit note. 

## 2015-08-15 ENCOUNTER — Other Ambulatory Visit: Payer: Self-pay | Admitting: Family

## 2015-08-16 ENCOUNTER — Other Ambulatory Visit: Payer: Self-pay | Admitting: General Practice

## 2015-08-16 MED ORDER — WARFARIN SODIUM 4 MG PO TABS: ORAL_TABLET | ORAL | Status: DC

## 2015-08-25 ENCOUNTER — Ambulatory Visit (INDEPENDENT_AMBULATORY_CARE_PROVIDER_SITE_OTHER): Payer: PPO | Admitting: General Practice

## 2015-08-25 DIAGNOSIS — D6862 Lupus anticoagulant syndrome: Secondary | ICD-10-CM

## 2015-08-25 DIAGNOSIS — Z5181 Encounter for therapeutic drug level monitoring: Secondary | ICD-10-CM | POA: Diagnosis not present

## 2015-08-25 LAB — POCT INR: INR: 3

## 2015-08-25 NOTE — Progress Notes (Signed)
Pre visit review using our clinic review tool, if applicable. No additional management support is needed unless otherwise documented below in the visit note. 

## 2015-08-26 NOTE — Progress Notes (Signed)
I have reviewed and agree with the plan. 

## 2015-08-26 NOTE — Progress Notes (Signed)
Ok with this plan 

## 2015-09-22 ENCOUNTER — Ambulatory Visit (INDEPENDENT_AMBULATORY_CARE_PROVIDER_SITE_OTHER): Payer: PPO | Admitting: General Practice

## 2015-09-22 DIAGNOSIS — D6862 Lupus anticoagulant syndrome: Secondary | ICD-10-CM

## 2015-09-22 DIAGNOSIS — Z5181 Encounter for therapeutic drug level monitoring: Secondary | ICD-10-CM

## 2015-09-22 LAB — POCT INR: INR: 2.7

## 2015-09-22 NOTE — Progress Notes (Signed)
Pre visit review using our clinic review tool, if applicable. No additional management support is needed unless otherwise documented below in the visit note. 

## 2015-09-22 NOTE — Progress Notes (Signed)
I agree with this plan.

## 2015-10-11 ENCOUNTER — Encounter: Payer: Self-pay | Admitting: Adult Health

## 2015-10-11 ENCOUNTER — Ambulatory Visit (INDEPENDENT_AMBULATORY_CARE_PROVIDER_SITE_OTHER): Payer: PPO | Admitting: Adult Health

## 2015-10-11 VITALS — BP 130/90 | HR 79 | Temp 98.4°F | Ht 68.5 in | Wt 167.7 lb

## 2015-10-11 DIAGNOSIS — J069 Acute upper respiratory infection, unspecified: Secondary | ICD-10-CM

## 2015-10-11 MED ORDER — DOXYCYCLINE HYCLATE 100 MG PO CAPS: 100.0000 mg | ORAL_CAPSULE | Freq: Two times a day (BID) | ORAL | Status: DC

## 2015-10-11 MED ORDER — METHYLPREDNISOLONE 4 MG PO TBPK: ORAL_TABLET | ORAL | Status: DC

## 2015-10-11 NOTE — Progress Notes (Signed)
Pre visit review using our clinic review tool, if applicable. No additional management support is needed unless otherwise documented below in the visit note. 

## 2015-10-11 NOTE — Patient Instructions (Addendum)
It was great seeing you today!  Your exam is consistent with bronchitis and sinusitis. I have sent in prescriptions for Doxycyline and Prednisone. Take as directed.   Continue to take Mucinex   Stay well hydrated and get some rest.     Sinusitis, Adult Sinusitis is redness, soreness, and inflammation of the paranasal sinuses. Paranasal sinuses are air pockets within the bones of your face. They are located beneath your eyes, in the middle of your forehead, and above your eyes. In healthy paranasal sinuses, mucus is able to drain out, and air is able to circulate through them by way of your nose. However, when your paranasal sinuses are inflamed, mucus and air can become trapped. This can allow bacteria and other germs to grow and cause infection. Sinusitis can develop quickly and last only a short time (acute) or continue over a long period (chronic). Sinusitis that lasts for more than 12 weeks is considered chronic. CAUSES Causes of sinusitis include:  Allergies.  Structural abnormalities, such as displacement of the cartilage that separates your nostrils (deviated septum), which can decrease the air flow through your nose and sinuses and affect sinus drainage.  Functional abnormalities, such as when the small hairs (cilia) that line your sinuses and help remove mucus do not work properly or are not present. SIGNS AND SYMPTOMS Symptoms of acute and chronic sinusitis are the same. The primary symptoms are pain and pressure around the affected sinuses. Other symptoms include:  Upper toothache.  Earache.  Headache.  Bad breath.  Decreased sense of smell and taste.  A cough, which worsens when you are lying flat.  Fatigue.  Fever.  Thick drainage from your nose, which often is green and may contain pus (purulent).  Swelling and warmth over the affected sinuses. DIAGNOSIS Your health care provider will perform a physical exam. During your exam, your health care provider may  perform any of the following to help determine if you have acute sinusitis or chronic sinusitis:  Look in your nose for signs of abnormal growths in your nostrils (nasal polyps).  Tap over the affected sinus to check for signs of infection.  View the inside of your sinuses using an imaging device that has a light attached (endoscope). If your health care provider suspects that you have chronic sinusitis, one or more of the following tests may be recommended:  Allergy tests.  Nasal culture. A sample of mucus is taken from your nose, sent to a lab, and screened for bacteria.  Nasal cytology. A sample of mucus is taken from your nose and examined by your health care provider to determine if your sinusitis is related to an allergy. TREATMENT Most cases of acute sinusitis are related to a viral infection and will resolve on their own within 10 days. Sometimes, medicines are prescribed to help relieve symptoms of both acute and chronic sinusitis. These may include pain medicines, decongestants, nasal steroid sprays, or saline sprays. However, for sinusitis related to a bacterial infection, your health care provider will prescribe antibiotic medicines. These are medicines that will help kill the bacteria causing the infection. Rarely, sinusitis is caused by a fungal infection. In these cases, your health care provider will prescribe antifungal medicine. For some cases of chronic sinusitis, surgery is needed. Generally, these are cases in which sinusitis recurs more than 3 times per year, despite other treatments. HOME CARE INSTRUCTIONS  Drink plenty of water. Water helps thin the mucus so your sinuses can drain more easily.  Use a  humidifier.  Inhale steam 3-4 times a day (for example, sit in the bathroom with the shower running).  Apply a warm, moist washcloth to your face 3-4 times a day, or as directed by your health care provider.  Use saline nasal sprays to help moisten and clean your  sinuses.  Take medicines only as directed by your health care provider.  If you were prescribed either an antibiotic or antifungal medicine, finish it all even if you start to feel better. SEEK IMMEDIATE MEDICAL CARE IF:  You have increasing pain or severe headaches.  You have nausea, vomiting, or drowsiness.  You have swelling around your face.  You have vision problems.  You have a stiff neck.  You have difficulty breathing.   This information is not intended to replace advice given to you by your health care provider. Make sure you discuss any questions you have with your health care provider.   Document Released: 10/01/2005 Document Revised: 10/22/2014 Document Reviewed: 10/16/2011 Elsevier Interactive Patient Education [employer]2016 [employer]

## 2015-10-11 NOTE — Progress Notes (Signed)
Subjective:    Patient ID: [name], male    DOB: [birth year], 64 y.o.   MRN: [id number]  HPI 64 year old male who presents to the office today for semi productive cough, congestion, maxillary sinus pain and pressure, PND and chest congestion. This has been an issues for 5 weeks. He thought his symptoms were improving but then 2 days ago his symptoms came back.   He denies any recent fevers, " 2-3 weeks ago I had a fever".    He has been using night time cough medication and Mucinex which has been helping.    Review of Systems  Constitutional: Positive for diaphoresis, activity change and fatigue. Negative for fever and chills.  HENT: Positive for congestion, ear pain, hearing loss, postnasal drip, rhinorrhea, sinus pressure and sore throat. Negative for ear discharge, tinnitus and trouble swallowing.   Respiratory: Positive for cough. Negative for shortness of breath and wheezing.   Cardiovascular: Negative.   Neurological: Positive for headaches.  All other systems reviewed and are negative.  Past Medical History  Diagnosis Date  . GERD (gastroesophageal reflux disease)   . Glaucoma   . Essential hypertension   . Hyperlipidemia   . CAD S/P percutaneous coronary angioplasty     7 stents per the patient (no record).  Stent to 90% inferior branch of an OM and mild scattered plaque in other vessels [facility]  01/08/13.  Cath 2016 with patent stents. He did have 70% stenosis in a small LAD that was about 2 mm. Size  . Lupus (HCC)   . AS (aortic stenosis)     Mild  . Pneumonitis     Lupus  . Hypercoagulable state (HCC)   . Barrett's esophagus 11/11/2014  . Joint pain   . Uses hearing aid     Social History   Social History  . Marital Status: Married    Spouse Name: N/A  . Number of Children: 2  . Years of Education: N/A   Occupational History  . Not on file.   Social History Main Topics  . Smoking status: Never Smoker   . Smokeless tobacco: Never Used  .  Alcohol Use: 3.5 oz/week    7 Standard drinks or equivalent per week  . Drug Use: No  . Sexual Activity: Not on file   Other Topics Concern  . Not on file   Social History Narrative   Married for 42 years   Has two grown children and three grandchildren.    Retired on disability   No longer have pets. Moved from a home to an apartment   Likes to read,workout, travel.     Past Surgical History  Procedure Laterality Date  . Tonsillectomy and adenoidectomy    . Knee surgery      bilateral  . Cataract extraction      x 2  . Nasal sinus surgery    . Colonoscopy    . Upper gastrointestinal endoscopy    . Cardiac catheterization N/A 04/01/2015    Procedure: Left Heart Cath and Coronary Angiography;  Surgeon: Marykay Lex, MD;  Location: [facility] INVASIVE CV LAB;  Service: Cardiovascular;  Laterality: N/A;    Family History  Problem Relation Age of Onset  . Diabetes Mother   . Hypertension Mother   . Arthritis Mother   . Diabetes Father   . Hypertension Father   . Arthritis Father   . CAD Father 44    died age 40 of MI  Allergies  Allergen Reactions  . Sulfa Antibiotics     Makes patient jittery, prefers not to take them.     Current Outpatient Prescriptions on File Prior to Visit  Medication Sig Dispense Refill  . amLODipine (NORVASC) 5 MG tablet Take 5 mg by mouth daily.    Marland Kitchen. aspirin EC 325 MG tablet Take 0.5 tablets by mouth daily.     . isosorbide mononitrate (IMDUR) 60 MG 24 hr tablet Take 1 tablet (60 mg total) by mouth daily. 90 tablet 3  . latanoprost (XALATAN) 0.005 % ophthalmic solution Place 1 drop into the right eye at bedtime.    . metoprolol tartrate (LOPRESSOR) 25 MG tablet Take 1 tablet (25 mg total) by mouth 2 (two) times daily. (Patient taking differently: Take 12.5 mg by mouth 2 (two) times daily. ) 180 tablet 3  . Multiple Vitamin (MULTIVITAMIN WITH MINERALS) TABS tablet Take 1 tablet by mouth daily.    Marland Kitchen. NASONEX 50 MCG/ACT nasal spray Place 2 sprays  into the nose daily.     . Omega-3 Fatty Acids (FISH OIL PO) Take 1 capsule by mouth daily.     Marland Kitchen. omeprazole (PRILOSEC) 40 MG capsule TAKE 1 BY MOUTH DAILY 90 capsule 3  . ramipril (ALTACE) 5 MG capsule Take 1 capsule (5 mg total) by mouth daily. 90 capsule 3  . RANEXA 500 MG 12 hr tablet Take 1 tablet by mouth twice a day as directed 180 tablet 1  . rosuvastatin (CRESTOR) 5 MG tablet Take 1 tablet (5 mg total) by mouth daily. Only takes on M W F (Patient taking differently: Take 5 mg by mouth every Monday, Wednesday, and Friday. ) 30 tablet 4  . warfarin (COUMADIN) 4 MG tablet Take as directed by anticoagulation clinic 90 tablet 1   No current facility-administered medications on file prior to visit.    BP 130/90 mmHg  Pulse 79  Temp(Src) 98.4 F (36.9 C) (Oral)  Ht 5' 8.5" (1.74 m)  Wt 167 lb 11.2 oz (76.068 kg)  BMI 25.12 kg/m2  SpO2 97%       Objective:   Physical Exam  Constitutional: He is oriented to person, place, and time. He appears well-developed and well-nourished. No distress.  HENT:  Head: Normocephalic and atraumatic.  Right Ear: External ear normal.  Left Ear: External ear normal.  Nose: Nose normal.  Mouth/Throat: Oropharynx is clear and moist. No oropharyngeal exudate.  TM visualized. No acute infection. Tube in left ear  Cardiovascular: Normal rate, regular rhythm, normal heart sounds and intact distal pulses.  Exam reveals no gallop and no friction rub.   No murmur heard. Pulmonary/Chest: Effort normal and breath sounds normal. No respiratory distress. He has no wheezes. He has no rales. He exhibits no tenderness.  Prolonged expiratory phase  Neurological: He is alert and oriented to person, place, and time.  Skin: Skin is warm and dry. No rash noted. He is not diaphoretic. No erythema. No pallor.  Psychiatric: He has a normal mood and affect. His behavior is normal. Judgment and thought content normal.  Nursing note and vitals reviewed.     Assessment &  Plan:  1. Acute upper respiratory infection - Little concern for pneumonia - doxycycline (VIBRAMYCIN) 100 MG capsule; Take 1 capsule (100 mg total) by mouth 2 (two) times daily.  Dispense: 14 capsule; Refill: 0 - methylPREDNISolone (MEDROL DOSEPAK) 4 MG TBPK tablet; Take as directed  Dispense: 21 tablet; Refill: 0 - Mucinex during the day - Follow  up if no improvement in the next 2-3 days.

## 2015-10-20 ENCOUNTER — Ambulatory Visit (INDEPENDENT_AMBULATORY_CARE_PROVIDER_SITE_OTHER): Payer: PPO | Admitting: General Practice

## 2015-10-20 DIAGNOSIS — Z5181 Encounter for therapeutic drug level monitoring: Secondary | ICD-10-CM | POA: Diagnosis not present

## 2015-10-20 DIAGNOSIS — D6862 Lupus anticoagulant syndrome: Secondary | ICD-10-CM | POA: Diagnosis not present

## 2015-10-20 LAB — POCT INR: INR: 1.6

## 2015-10-20 NOTE — Progress Notes (Signed)
Pre visit review using our clinic review tool, if applicable. No additional management support is needed unless otherwise documented below in the visit note. 

## 2015-10-22 NOTE — Progress Notes (Signed)
I agree with this this plan 

## 2015-11-06 NOTE — Progress Notes (Signed)
HPI The patient presents for follow up of a history of coronary disease.  He has an extensive history of coronary disease and reports 7 stents in the past. (I don't have these records.)  He did have chest pain last year and had a cath in May which demonstrated patent stents in the LAD, circ and RCA with 70% stenosis in a small LAD.  He was managed medically.   Since I last saw him he has done relatively well. He's not exercising but he recently moved into a house and had to move boxes. With this he denies any acute cardiovascular symptoms. The patient denies any new symptoms such as chest discomfort, neck or arm discomfort. There has been no new shortness of breath, PND or orthopnea. There have been no reported palpitations, presyncope or syncope.   He has not needed to take any sublingual nitroglycerin.  He did have a cold and was slow to recover from this.  Allergies  Allergen Reactions  . Sulfa Antibiotics     Makes patient jittery, prefers not to take them.     Current Outpatient Prescriptions  Medication Sig Dispense Refill  . amLODipine (NORVASC) 5 MG tablet Take 5 mg by mouth daily.    Marland Kitchen aspirin EC 325 MG tablet Take 0.5 tablets by mouth daily.     . brimonidine-timolol (COMBIGAN) 0.2-0.5 % ophthalmic solution Place 1 drop into both eyes every 12 (twelve) hours.    . isosorbide mononitrate (IMDUR) 60 MG 24 hr tablet Take 1 tablet (60 mg total) by mouth daily. 90 tablet 3  . metoprolol tartrate (LOPRESSOR) 25 MG tablet Take 1 tablet (25 mg total) by mouth 2 (two) times daily. (Patient taking differently: Take 12.5 mg by mouth 2 (two) times daily. ) 180 tablet 3  . Multiple Vitamin (MULTIVITAMIN WITH MINERALS) TABS tablet Take 1 tablet by mouth daily.    Marland Kitchen NASONEX 50 MCG/ACT nasal spray Place 2 sprays into the nose daily.     . Omega-3 Fatty Acids (FISH OIL PO) Take 1 capsule by mouth daily.     Marland Kitchen omeprazole (PRILOSEC) 40 MG capsule TAKE 1 BY MOUTH DAILY 90 capsule 3  . ramipril (ALTACE)  5 MG capsule Take 1 capsule (5 mg total) by mouth daily. 90 capsule 3  . RANEXA 500 MG 12 hr tablet Take 1 tablet by mouth twice a day as directed 180 tablet 1  . rosuvastatin (CRESTOR) 5 MG tablet Take 1 tablet (5 mg total) by mouth daily. Only takes on M W F (Patient taking differently: Take 5 mg by mouth every Monday, Wednesday, and Friday. ) 30 tablet 4  . warfarin (COUMADIN) 4 MG tablet Take as directed by anticoagulation clinic 90 tablet 1   No current facility-administered medications for this visit.    Past Medical History  Diagnosis Date  . GERD (gastroesophageal reflux disease)   . Glaucoma   . Essential hypertension   . Hyperlipidemia   . CAD S/P percutaneous coronary angioplasty     7 stents per the patient (no record).  Stent to 90% inferior branch of an OM and mild scattered plaque in other vessels [facility]  01/08/13.  Cath 2016 with patent stents. He did have 70% stenosis in a small LAD that was about 2 mm. Size  . Lupus (HCC)   . AS (aortic stenosis)     Mild  . Pneumonitis     Lupus  . Hypercoagulable state (HCC)   . Barrett's esophagus 11/11/2014  .  Joint pain   . Uses hearing aid     Past Surgical History  Procedure Laterality Date  . Tonsillectomy and adenoidectomy    . Knee surgery      bilateral  . Cataract extraction      x 2  . Nasal sinus surgery    . Colonoscopy    . Upper gastrointestinal endoscopy    . Cardiac catheterization N/A 04/01/2015    Procedure: Left Heart Cath and Coronary Angiography;  Surgeon: Marykay Lex, MD;  Location: [facility] INVASIVE CV LAB;  Service: Cardiovascular;  Laterality: N/A;    ROS:  As stated in the HPI and negative for all other systems.  PHYSICAL EXAM BP 110/76 mmHg  Pulse 64  Ht  (1.727 m)  Wt 161 lb (73.029 kg)  BMI 24.49 kg/m2 GENERAL:  Well appearing NECK:  No jugular venous distention, waveform within normal limits, carotid upstroke brisk and symmetric, possible left bruit, no thyromegaly LUNGS:   Clear to auscultation bilaterally BACK:  No CVA tenderness CHEST:  Unremarkable HEART:  PMI not displaced or sustained,S1 and S2 within normal limits, no S3, no S4, no clicks, no rubs, apical systolic murmur at the apex and slightly radiating, no diasotlic murmurs ABD:  Flat, positive bowel sounds normal in frequency in pitch, no bruits, no rebound, no guarding, no midline pulsatile mass, no hepatomegaly, no splenomegaly EXT:  2 plus pulses left radial, decreased right radial, no edema, no cyanosis no clubbing.   ASSESSMENT AND PLAN  CAD:   He has disease as described.  The patient has no new sypmtoms.  No further cardiovascular testing is indicated.  We will continue with aggressive risk reduction and meds as listed.  HTN:  This is being treated in the context of managing his coronary disease.  DYSLIPIDEMIA:  LDL was 77 in May 2016.  No change in therapy is indicated.  BRUIT:  He had only mild plaque.  No further work up is indicated at this time.   AS:  This is mild on echo in 2015. No change in therapy is indicated. We will follow this clinically.  HYPERCOAGUABLE:  Because of his extensive disease he is on both aspirin and warfarin

## 2015-11-07 ENCOUNTER — Ambulatory Visit (INDEPENDENT_AMBULATORY_CARE_PROVIDER_SITE_OTHER): Payer: PPO | Admitting: Cardiology

## 2015-11-07 ENCOUNTER — Encounter: Payer: Self-pay | Admitting: Cardiology

## 2015-11-07 ENCOUNTER — Ambulatory Visit: Payer: PPO

## 2015-11-07 VITALS — BP 110/76 | HR 64 | Ht 68.0 in | Wt 161.0 lb

## 2015-11-07 DIAGNOSIS — I2510 Atherosclerotic heart disease of native coronary artery without angina pectoris: Secondary | ICD-10-CM | POA: Diagnosis not present

## 2015-11-07 NOTE — Patient Instructions (Signed)
Dr Antoine Poche recommends that you schedule a follow-up appointment in 4 months. You will receive a reminder letter in the mail two months in advance. If you don't receive a letter, please call our office to schedule the follow-up appointment.  If you need a refill on your cardiac medications before your next appointment, please call your pharmacy.

## 2015-11-09 ENCOUNTER — Ambulatory Visit: Payer: PPO

## 2015-11-09 ENCOUNTER — Other Ambulatory Visit: Payer: Self-pay | Admitting: Family

## 2015-11-14 ENCOUNTER — Ambulatory Visit (INDEPENDENT_AMBULATORY_CARE_PROVIDER_SITE_OTHER): Payer: PPO | Admitting: General Practice

## 2015-11-14 DIAGNOSIS — D6862 Lupus anticoagulant syndrome: Secondary | ICD-10-CM

## 2015-11-14 DIAGNOSIS — Z5181 Encounter for therapeutic drug level monitoring: Secondary | ICD-10-CM

## 2015-11-14 LAB — POCT INR: INR: 2.3

## 2015-11-14 NOTE — Progress Notes (Signed)
Pre visit review using our clinic review tool, if applicable. No additional management support is needed unless otherwise documented below in the visit note. 

## 2015-11-14 NOTE — Progress Notes (Signed)
I agree with this plan.

## 2015-12-02 DIAGNOSIS — H401112 Primary open-angle glaucoma, right eye, moderate stage: Secondary | ICD-10-CM | POA: Diagnosis not present

## 2015-12-05 ENCOUNTER — Telehealth: Payer: Self-pay | Admitting: Physician Assistant

## 2015-12-05 NOTE — Telephone Encounter (Signed)
Patient was prescribed xifaxan for diarrhea in 10/2014.  Patient reports that he is not having diarrhea, but excess gas.  The xifaxan helped with these symptoms last year.  Please advise is ok to send rx?

## 2015-12-05 NOTE — Telephone Encounter (Signed)
OK to Rx xifaxan 550 mg tid x 14 d

## 2015-12-06 ENCOUNTER — Telehealth: Payer: Self-pay

## 2015-12-06 MED ORDER — RIFAXIMIN 550 MG PO TABS: 550.0000 mg | ORAL_TABLET | Freq: Three times a day (TID) | ORAL | Status: DC

## 2015-12-06 NOTE — Telephone Encounter (Signed)
Patient notified rx sent 

## 2015-12-06 NOTE — Telephone Encounter (Signed)
Faxed the information to [facility] to get them to do a prior authorization for the xifaxan, Dx: diarrhea, ICD-10: R19.7.

## 2015-12-07 MED ORDER — RIFAXIMIN 550 MG PO TABS: 550.0000 mg | ORAL_TABLET | Freq: Three times a day (TID) | ORAL | Status: DC

## 2015-12-07 NOTE — Telephone Encounter (Signed)
[facility] is needing a script sent over for the Xifaxin - can do electronic or Fax [phone])

## 2015-12-07 NOTE — Telephone Encounter (Signed)
Sent rx for the xifaxan to [facility] as requested to get prior authorization done.

## 2015-12-12 ENCOUNTER — Ambulatory Visit (INDEPENDENT_AMBULATORY_CARE_PROVIDER_SITE_OTHER): Payer: PPO | Admitting: General Practice

## 2015-12-12 DIAGNOSIS — Z5181 Encounter for therapeutic drug level monitoring: Secondary | ICD-10-CM | POA: Diagnosis not present

## 2015-12-12 DIAGNOSIS — D6862 Lupus anticoagulant syndrome: Secondary | ICD-10-CM

## 2015-12-12 LAB — POCT INR: INR: 2.4

## 2015-12-12 NOTE — Progress Notes (Signed)
Pre visit review using our clinic review tool, if applicable. No additional management support is needed unless otherwise documented below in the visit note. 

## 2015-12-12 NOTE — Telephone Encounter (Signed)
[facility] called and patient's xifaxan was approved with a high co-pay of $400 or so but patient is going to pay to get it and this will also meet his deductible for the new year.

## 2015-12-12 NOTE — Progress Notes (Signed)
I agree with this plan.

## 2015-12-16 ENCOUNTER — Other Ambulatory Visit: Payer: Self-pay | Admitting: Cardiology

## 2015-12-16 NOTE — Telephone Encounter (Signed)
Rx(s) sent to pharmacy electronically.  

## 2015-12-19 ENCOUNTER — Other Ambulatory Visit: Payer: Self-pay

## 2015-12-19 MED ORDER — METOPROLOL TARTRATE 25 MG PO TABS: 12.5000 mg | ORAL_TABLET | Freq: Two times a day (BID) | ORAL | Status: DC

## 2015-12-19 NOTE — Telephone Encounter (Signed)
Rx(s) sent to pharmacy electronically.  

## 2016-01-09 ENCOUNTER — Ambulatory Visit (INDEPENDENT_AMBULATORY_CARE_PROVIDER_SITE_OTHER): Payer: PPO | Admitting: General Practice

## 2016-01-09 DIAGNOSIS — D6862 Lupus anticoagulant syndrome: Secondary | ICD-10-CM | POA: Diagnosis not present

## 2016-01-09 DIAGNOSIS — Z5181 Encounter for therapeutic drug level monitoring: Secondary | ICD-10-CM

## 2016-01-09 LAB — POCT INR: INR: 2

## 2016-01-09 NOTE — Progress Notes (Signed)
Pre visit review using our clinic review tool, if applicable. No additional management support is needed unless otherwise documented below in the visit note. 

## 2016-01-09 NOTE — Progress Notes (Signed)
I agree with this plan.

## 2016-01-17 ENCOUNTER — Ambulatory Visit (INDEPENDENT_AMBULATORY_CARE_PROVIDER_SITE_OTHER): Payer: PPO | Admitting: Internal Medicine

## 2016-01-17 ENCOUNTER — Encounter: Payer: Self-pay | Admitting: Internal Medicine

## 2016-01-17 VITALS — BP 140/80 | HR 63 | Ht 68.0 in | Wt 162.2 lb

## 2016-01-17 DIAGNOSIS — IMO0001 Reserved for inherently not codable concepts without codable children: Secondary | ICD-10-CM

## 2016-01-17 DIAGNOSIS — R143 Flatulence: Secondary | ICD-10-CM | POA: Diagnosis not present

## 2016-01-17 NOTE — Progress Notes (Signed)
   Subjective:    Patient ID: [name], male    DOB: April 30, 1951, 65 y.o.   MRN: [id number] Cc: gas/flatus HPI Very foul smelling gas, "can peel paint" Not having dirrhea 2016 Xifaxan helped that and diarrhea He called in 2/17 and retried Xifaxan - not helping now OTC loperamide, simeticone  sxs began late 2016  Not much pain  took doxy in late 2016 but sxs before Diet - low fat, not a lot of roughage?  Medications, allergies, past medical history, past surgical history, family history and social history are reviewed and updated in the EMR.  Review of Systems As above    Objective:   Physical Exam No Acute distress BP 140/80 mmHg  Pulse 63  Ht 5\' 8"  (1.727 m)  Wt 162 lb 4 oz (73.596 kg)  BMI 24.68 kg/m2    Assessment & Plan:   1. Gas   2. Flatulence    Last year he had what satellite diarrhea abdominal IBS and got better with Xifaxan. Now gas and flatus without diarrhea which did not respond. We will try probiotic VSL #3 once a day and I have given him a gas and flatulence prevention diet. He will contact me if one month with a probiotic and these changes do not make a difference.  15 minutes time spent with patient > half in counseling coordination of care

## 2016-01-17 NOTE — Patient Instructions (Signed)
  Today you have been given a handout on gas to read.   [facility] and get VSL #3 , take one daily for a month.    I appreciate the opportunity to care for you.

## 2016-01-20 ENCOUNTER — Other Ambulatory Visit: Payer: Self-pay | Admitting: *Deleted

## 2016-01-20 MED ORDER — RANOLAZINE ER 500 MG PO TB12: 500.0000 mg | ORAL_TABLET | Freq: Two times a day (BID) | ORAL | Status: DC

## 2016-01-20 NOTE — Telephone Encounter (Signed)
Rx request sent to pharmacy.  

## 2016-02-06 ENCOUNTER — Other Ambulatory Visit: Payer: Self-pay | Admitting: Adult Health

## 2016-02-09 DIAGNOSIS — H3530 Unspecified macular degeneration: Secondary | ICD-10-CM | POA: Diagnosis not present

## 2016-02-09 DIAGNOSIS — M25561 Pain in right knee: Secondary | ICD-10-CM | POA: Diagnosis not present

## 2016-02-09 DIAGNOSIS — R079 Chest pain, unspecified: Secondary | ICD-10-CM | POA: Diagnosis not present

## 2016-02-09 DIAGNOSIS — M329 Systemic lupus erythematosus, unspecified: Secondary | ICD-10-CM | POA: Diagnosis not present

## 2016-02-13 ENCOUNTER — Telehealth: Payer: Self-pay | Admitting: Cardiology

## 2016-02-13 DIAGNOSIS — I10 Essential (primary) hypertension: Secondary | ICD-10-CM

## 2016-02-13 DIAGNOSIS — Z79899 Other long term (current) drug therapy: Secondary | ICD-10-CM

## 2016-02-13 DIAGNOSIS — E7800 Pure hypercholesterolemia, unspecified: Secondary | ICD-10-CM

## 2016-02-13 NOTE — Telephone Encounter (Signed)
New message    Pt is calling for rn because he wants provider to order some test to be done

## 2016-02-13 NOTE — Telephone Encounter (Signed)
Please place an order for a CBC, CMET and fasting lipid profile.

## 2016-02-13 NOTE — Telephone Encounter (Signed)
Returned call, left msg for pt on answering machine. Pt returns for 1 yr OV on 5/25 Informed him I would send to Dr. Antoine PocheHochrein for request on recommended pre visit labwork.

## 2016-02-14 NOTE — Telephone Encounter (Signed)
Spoke with pt letting him know labs was ordered for him to get drawn before next appt.

## 2016-02-20 ENCOUNTER — Ambulatory Visit (INDEPENDENT_AMBULATORY_CARE_PROVIDER_SITE_OTHER): Payer: PPO | Admitting: General Practice

## 2016-02-20 DIAGNOSIS — Z5181 Encounter for therapeutic drug level monitoring: Secondary | ICD-10-CM | POA: Diagnosis not present

## 2016-02-20 DIAGNOSIS — D6862 Lupus anticoagulant syndrome: Secondary | ICD-10-CM

## 2016-02-20 LAB — POCT INR: INR: 2

## 2016-02-20 NOTE — Progress Notes (Signed)
Pre visit review using our clinic review tool, if applicable. No additional management support is needed unless otherwise documented below in the visit note. 

## 2016-02-21 NOTE — Progress Notes (Signed)
I agree with this plan.

## 2016-02-28 ENCOUNTER — Other Ambulatory Visit: Payer: Self-pay

## 2016-02-28 DIAGNOSIS — I10 Essential (primary) hypertension: Secondary | ICD-10-CM | POA: Diagnosis not present

## 2016-02-28 DIAGNOSIS — Z79899 Other long term (current) drug therapy: Secondary | ICD-10-CM | POA: Diagnosis not present

## 2016-02-28 DIAGNOSIS — E7800 Pure hypercholesterolemia, unspecified: Secondary | ICD-10-CM | POA: Diagnosis not present

## 2016-02-28 MED ORDER — AMLODIPINE BESYLATE 5 MG PO TABS: 5.0000 mg | ORAL_TABLET | Freq: Every day | ORAL | Status: DC

## 2016-02-28 NOTE — Telephone Encounter (Signed)
Rx(s) sent to pharmacy electronically.  

## 2016-02-29 LAB — CBC
HEMATOCRIT: 38.5 % (ref 38.5–50.0)
Hemoglobin: 11.9 g/dL — ABNORMAL LOW (ref 13.2–17.1)
MCH: 26.2 pg — AB (ref 27.0–33.0)
MCHC: 30.9 g/dL — ABNORMAL LOW (ref 32.0–36.0)
MCV: 84.6 fL (ref 80.0–100.0)
MPV: 10.6 fL (ref 7.5–12.5)
PLATELETS: 177 10*3/uL (ref 140–400)
RBC: 4.55 MIL/uL (ref 4.20–5.80)
RDW: 17.9 % — AB (ref 11.0–15.0)
WBC: 4.9 10*3/uL (ref 3.8–10.8)

## 2016-02-29 LAB — COMPLETE METABOLIC PANEL WITH GFR
ALT: 19 U/L (ref 9–46)
AST: 25 U/L (ref 10–35)
Albumin: 4.1 g/dL (ref 3.6–5.1)
Alkaline Phosphatase: 23 U/L — ABNORMAL LOW (ref 40–115)
BILIRUBIN TOTAL: 0.4 mg/dL (ref 0.2–1.2)
BUN: 17 mg/dL (ref 7–25)
CALCIUM: 8.7 mg/dL (ref 8.6–10.3)
CO2: 26 mmol/L (ref 20–31)
CREATININE: 0.73 mg/dL (ref 0.70–1.25)
Chloride: 102 mmol/L (ref 98–110)
Glucose, Bld: 88 mg/dL (ref 65–99)
Potassium: 4.8 mmol/L (ref 3.5–5.3)
Sodium: 140 mmol/L (ref 135–146)
TOTAL PROTEIN: 7.3 g/dL (ref 6.1–8.1)

## 2016-02-29 LAB — LIPID PANEL
CHOLESTEROL: 96 mg/dL — AB (ref 125–200)
HDL: 24 mg/dL — AB (ref >=40)
LDL CALC: 49 mg/dL (ref <130)
TRIGLYCERIDES: 116 mg/dL (ref <150)
Total CHOL/HDL Ratio: 4 Ratio (ref <=5.0)
VLDL: 23 mg/dL (ref <30)

## 2016-03-02 DIAGNOSIS — H401123 Primary open-angle glaucoma, left eye, severe stage: Secondary | ICD-10-CM | POA: Diagnosis not present

## 2016-03-02 DIAGNOSIS — H401113 Primary open-angle glaucoma, right eye, severe stage: Secondary | ICD-10-CM | POA: Diagnosis not present

## 2016-03-07 NOTE — Progress Notes (Signed)
HPI The patient presents for follow up of a history of coronary disease.  He has an extensive history of coronary disease and reports 7 stents in the past. (I don't have these records.)  He did have chest pain last year and had a cath in May of last year which demonstrated patent stents in the LAD, circ and RCA with 70% stenosis in a small LAD.  He was managed medically.   He is exercising routinely.  With this he denies any acute cardiovascular symptoms. The patient denies any new symptoms such as chest discomfort, neck or arm discomfort. There has been no new shortness of breath, PND or orthopnea. There have been no reported palpitations, presyncope or syncope.   He has not needed to take any sublingual nitroglycerin.     Allergies  Allergen Reactions  . Sulfa Antibiotics     Makes patient jittery, prefers not to take them.     Current Outpatient Prescriptions  Medication Sig Dispense Refill  . amLODipine (NORVASC) 5 MG tablet Take 1 tablet (5 mg total) by mouth daily. 30 tablet 8  . aspirin EC 325 MG tablet Take 0.5 tablets by mouth daily.     . brimonidine-timolol (COMBIGAN) 0.2-0.5 % ophthalmic solution Place 1 drop into the right eye every 12 (twelve) hours.     . Flaxseed, Linseed, (FLAX SEED OIL PO) Take 1 tablet by mouth daily.    . isosorbide mononitrate (IMDUR) 60 MG 24 hr tablet Take 1 tablet (60 mg total) by mouth daily. 90 tablet 3  . metoprolol tartrate (LOPRESSOR) 25 MG tablet Take 0.5 tablets (12.5 mg total) by mouth 2 (two) times daily. 180 tablet 3  . Multiple Vitamin (MULTIVITAMIN WITH MINERALS) TABS tablet Take 1 tablet by mouth daily.    Marland Kitchen NASONEX 50 MCG/ACT nasal spray Place 2 sprays into the nose daily.     . Omega-3 Fatty Acids (FISH OIL PO) Take 3 capsules by mouth daily.     Marland Kitchen omeprazole (PRILOSEC) 40 MG capsule TAKE ONE CAPSULE BY MOUTH DAILY 90 capsule 0  . Probiotic Product (VSL#3 PO) Take 1 capsule by mouth daily. Take for a month    . ramipril (ALTACE) 5 MG  capsule Take 1 capsule (5 mg total) by mouth daily. 90 capsule 3  . ranolazine (RANEXA) 500 MG 12 hr tablet Take 1 tablet (500 mg total) by mouth 2 (two) times daily. 180 tablet 0  . rosuvastatin (CRESTOR) 5 MG tablet Take 1 tablet ( ) by mouth on Monday, Wednesday, Friday 15 tablet 11  . warfarin (COUMADIN) 4 MG tablet Take as directed by anticoagulation clinic 90 tablet 1   No current facility-administered medications for this visit.    Past Medical History  Diagnosis Date  . GERD (gastroesophageal reflux disease)   . Glaucoma   . Essential hypertension   . Hyperlipidemia   . CAD S/P percutaneous coronary angioplasty     7 stents per the patient (no record).  Stent to 90% inferior branch of an OM and mild scattered plaque in other vessels [facility]  01/08/13.  Cath 2016 with patent stents. He did have 70% stenosis in a small LAD that was about 2 mm. Size  . Lupus (HCC)   . AS (aortic stenosis)     Mild  . Pneumonitis     Lupus  . Hypercoagulable state (HCC)   . Barrett's esophagus 11/11/2014  . Joint pain   . Uses hearing aid     Past Surgical  History  Procedure Laterality Date  . Tonsillectomy and adenoidectomy    . Knee surgery      bilateral  . Cataract extraction      x 2  . Nasal sinus surgery    . Colonoscopy    . Upper gastrointestinal endoscopy    . Cardiac catheterization N/A 04/01/2015    Procedure: Left Heart Cath and Coronary Angiography;  Surgeon: Marykay Lexavid W Harding, MD;  [facility];  Service: Cardiovascular;  Laterality: N/A;    ROS:  As stated in the HPI and negative for all other systems.  PHYSICAL EXAM BP 113/77 mmHg  Pulse 70  Ht 5\' 8"  (1.727 m)  Wt 160 lb (72.576 kg)  BMI 24.33 kg/m2 GENERAL:  Well appearing NECK:  No jugular venous distention, waveform within normal limits, carotid upstroke brisk and symmetric, possible left bruit, no thyromegaly LUNGS:  Clear to auscultation bilaterally BACK:  No CVA tenderness CHEST:   Unremarkable HEART:  PMI not displaced or sustained,S1 and S2 within normal limits, no S3, no S4, no clicks, no rubs, apical systolic murmur at the apex and slightly radiating, no diasotlic murmurs ABD:  Flat, positive bowel sounds normal in frequency in pitch, no bruits, no rebound, no guarding, no midline pulsatile mass, no hepatomegaly, no splenomegaly EXT:  2 plus pulses left radial, decreased right radial, no edema, no cyanosis no clubbing.  Lab Results  Component Value Date   CHOL 96* 02/28/2016   TRIG 116 02/28/2016   HDL 24* 02/28/2016   LDLCALC 49 02/28/2016     ASSESSMENT AND PLAN  CAD:   He has disease as described.  The patient has no new sypmtoms.  No further cardiovascular testing is indicated.  We will continue with aggressive risk reduction and meds as listed.  HTN:  This is being treated in the context of managing his coronary disease.  DYSLIPIDEMIA:  LDL was 49 as above.  He will remain on meds as listed.   BRUIT:  He had only mild plaque.  No further work up is indicated at this time.   AS:  This is mild on echo in 2015. No change in therapy is indicated. We will follow this with repeat echo in May of next year.   HYPERCOAGUABLE:  Because of his extensive disease he is on both aspirin and warfarin

## 2016-03-08 ENCOUNTER — Ambulatory Visit (INDEPENDENT_AMBULATORY_CARE_PROVIDER_SITE_OTHER): Payer: PPO | Admitting: Cardiology

## 2016-03-08 ENCOUNTER — Encounter: Payer: Self-pay | Admitting: Cardiology

## 2016-03-08 VITALS — BP 113/77 | HR 70 | Ht 68.0 in | Wt 160.0 lb

## 2016-03-08 DIAGNOSIS — I2510 Atherosclerotic heart disease of native coronary artery without angina pectoris: Secondary | ICD-10-CM

## 2016-03-08 NOTE — Patient Instructions (Addendum)
Your physician wants you to follow-up in: 6 Months You will receive a reminder letter in the mail two months in advance. If you don't receive a letter, please call our office to schedule the follow-up appointment.  

## 2016-03-13 DIAGNOSIS — Z9622 Myringotomy tube(s) status: Secondary | ICD-10-CM | POA: Diagnosis not present

## 2016-03-13 DIAGNOSIS — J343 Hypertrophy of nasal turbinates: Secondary | ICD-10-CM | POA: Diagnosis not present

## 2016-03-13 DIAGNOSIS — Z974 Presence of external hearing-aid: Secondary | ICD-10-CM | POA: Diagnosis not present

## 2016-03-13 DIAGNOSIS — H906 Mixed conductive and sensorineural hearing loss, bilateral: Secondary | ICD-10-CM | POA: Diagnosis not present

## 2016-03-13 DIAGNOSIS — J0121 Acute recurrent ethmoidal sinusitis: Secondary | ICD-10-CM | POA: Diagnosis not present

## 2016-03-26 ENCOUNTER — Other Ambulatory Visit: Payer: Self-pay | Admitting: Adult Health

## 2016-04-02 ENCOUNTER — Ambulatory Visit (INDEPENDENT_AMBULATORY_CARE_PROVIDER_SITE_OTHER): Payer: PPO | Admitting: General Practice

## 2016-04-02 DIAGNOSIS — D6862 Lupus anticoagulant syndrome: Secondary | ICD-10-CM | POA: Diagnosis not present

## 2016-04-02 DIAGNOSIS — Z5181 Encounter for therapeutic drug level monitoring: Secondary | ICD-10-CM

## 2016-04-02 LAB — POCT INR: INR: 2.4

## 2016-04-02 NOTE — Progress Notes (Signed)
Pre visit review using our clinic review tool, if applicable. No additional management support is needed unless otherwise documented below in the visit note. 

## 2016-04-03 NOTE — Progress Notes (Signed)
I agree with this plan.

## 2016-04-07 ENCOUNTER — Other Ambulatory Visit: Payer: Self-pay | Admitting: Adult Health

## 2016-04-09 ENCOUNTER — Other Ambulatory Visit: Payer: Self-pay

## 2016-04-09 MED ORDER — ISOSORBIDE MONONITRATE ER 60 MG PO TB24: 60.0000 mg | ORAL_TABLET | Freq: Every day | ORAL | Status: DC

## 2016-04-09 NOTE — Telephone Encounter (Signed)
Refill sent to pharmacy.   

## 2016-04-09 NOTE — Telephone Encounter (Signed)
Rx(s) sent to pharmacy electronically.  

## 2016-04-10 ENCOUNTER — Other Ambulatory Visit: Payer: Self-pay | Admitting: Cardiovascular Disease

## 2016-04-10 DIAGNOSIS — Z9622 Myringotomy tube(s) status: Secondary | ICD-10-CM | POA: Diagnosis not present

## 2016-04-10 DIAGNOSIS — H90A31 Mixed conductive and sensorineural hearing loss, unilateral, right ear with restricted hearing on the contralateral side: Secondary | ICD-10-CM | POA: Diagnosis not present

## 2016-04-10 DIAGNOSIS — H903 Sensorineural hearing loss, bilateral: Secondary | ICD-10-CM | POA: Diagnosis not present

## 2016-04-10 DIAGNOSIS — H90A22 Sensorineural hearing loss, unilateral, left ear, with restricted hearing on the contralateral side: Secondary | ICD-10-CM | POA: Diagnosis not present

## 2016-04-10 DIAGNOSIS — H6983 Other specified disorders of Eustachian tube, bilateral: Secondary | ICD-10-CM | POA: Diagnosis not present

## 2016-04-10 DIAGNOSIS — J343 Hypertrophy of nasal turbinates: Secondary | ICD-10-CM | POA: Diagnosis not present

## 2016-04-23 ENCOUNTER — Other Ambulatory Visit: Payer: Self-pay | Admitting: Adult Health

## 2016-04-24 DIAGNOSIS — Z9622 Myringotomy tube(s) status: Secondary | ICD-10-CM | POA: Diagnosis not present

## 2016-04-24 DIAGNOSIS — H9193 Unspecified hearing loss, bilateral: Secondary | ICD-10-CM | POA: Diagnosis not present

## 2016-04-24 DIAGNOSIS — H9222 Otorrhagia, left ear: Secondary | ICD-10-CM | POA: Diagnosis not present

## 2016-05-06 ENCOUNTER — Other Ambulatory Visit: Payer: Self-pay | Admitting: Adult Health

## 2016-05-07 ENCOUNTER — Other Ambulatory Visit: Payer: Self-pay | Admitting: Adult Health

## 2016-05-14 ENCOUNTER — Ambulatory Visit: Payer: PPO

## 2016-05-16 ENCOUNTER — Ambulatory Visit (INDEPENDENT_AMBULATORY_CARE_PROVIDER_SITE_OTHER): Payer: PPO | Admitting: General Practice

## 2016-05-16 DIAGNOSIS — Z5181 Encounter for therapeutic drug level monitoring: Secondary | ICD-10-CM | POA: Diagnosis not present

## 2016-05-16 DIAGNOSIS — D6862 Lupus anticoagulant syndrome: Secondary | ICD-10-CM | POA: Diagnosis not present

## 2016-05-16 LAB — POCT INR: INR: 3

## 2016-05-16 NOTE — Progress Notes (Signed)
I agree with this plan.

## 2016-05-22 DIAGNOSIS — Z9622 Myringotomy tube(s) status: Secondary | ICD-10-CM | POA: Diagnosis not present

## 2016-05-22 DIAGNOSIS — H6983 Other specified disorders of Eustachian tube, bilateral: Secondary | ICD-10-CM | POA: Diagnosis not present

## 2016-05-22 DIAGNOSIS — H903 Sensorineural hearing loss, bilateral: Secondary | ICD-10-CM | POA: Diagnosis not present

## 2016-06-04 ENCOUNTER — Other Ambulatory Visit: Payer: Self-pay | Admitting: Adult Health

## 2016-06-08 DIAGNOSIS — H401111 Primary open-angle glaucoma, right eye, mild stage: Secondary | ICD-10-CM | POA: Diagnosis not present

## 2016-06-13 ENCOUNTER — Encounter: Payer: Self-pay | Admitting: Physician Assistant

## 2016-06-13 ENCOUNTER — Ambulatory Visit (INDEPENDENT_AMBULATORY_CARE_PROVIDER_SITE_OTHER): Payer: PPO | Admitting: Physician Assistant

## 2016-06-13 ENCOUNTER — Telehealth: Payer: Self-pay | Admitting: Cardiology

## 2016-06-13 VITALS — BP 124/72 | HR 61 | Ht 68.5 in | Wt 162.4 lb

## 2016-06-13 DIAGNOSIS — I10 Essential (primary) hypertension: Secondary | ICD-10-CM | POA: Diagnosis not present

## 2016-06-13 DIAGNOSIS — E785 Hyperlipidemia, unspecified: Secondary | ICD-10-CM

## 2016-06-13 DIAGNOSIS — I779 Disorder of arteries and arterioles, unspecified: Secondary | ICD-10-CM | POA: Diagnosis not present

## 2016-06-13 DIAGNOSIS — I739 Peripheral vascular disease, unspecified: Secondary | ICD-10-CM

## 2016-06-13 DIAGNOSIS — I25119 Atherosclerotic heart disease of native coronary artery with unspecified angina pectoris: Secondary | ICD-10-CM

## 2016-06-13 DIAGNOSIS — I350 Nonrheumatic aortic (valve) stenosis: Secondary | ICD-10-CM

## 2016-06-13 DIAGNOSIS — Z86718 Personal history of other venous thrombosis and embolism: Secondary | ICD-10-CM

## 2016-06-13 MED ORDER — ISOSORBIDE MONONITRATE ER 60 MG PO TB24: 90.0000 mg | ORAL_TABLET | Freq: Every day | ORAL | 11 refills | Status: DC

## 2016-06-13 MED ORDER — ASPIRIN EC 81 MG PO TBEC: 81.0000 mg | DELAYED_RELEASE_TABLET | Freq: Every day | ORAL | 3 refills | Status: DC

## 2016-06-13 NOTE — Telephone Encounter (Signed)
appt made - patient aware to divert to ED if his symptoms are worse.  He will see Cline CrockKathryn Thompson at 2pm today. Has been given appt information and driving directions.

## 2016-06-13 NOTE — Progress Notes (Signed)
Cardiology Office Note    Date:  06/13/2016   ID:  Kenneth HerbDavid T Wanless, DOB 11/07/1950, MRN [id number]  PCP:  Shirline Freesory Nafziger, NP  Cardiologist:  Dr. Antoine PocheHochrein  CC: chest pain/sob  History of Present Illness:  [name] is a 65 y.o. male with a history of mild AS, lupus, GERD, DVT/PE on Coumadin, hypertension, hyperlipidemia and CAD s/p multiple stenting (?7) who presents to clinic for evaluation of chest pain and sob.   He has an extensive history of coronary disease and reports [facility]. (I don't have these records.) He did have chest pain last year and had a cath in May 2016 which demonstrated patent stents in the LAD, circ and RCA with 70% stenosis in a small LAD. No good PCI targets and felt that his could certainly be the cause of his angina. He was managed medically. He has been on amlodipine 5mg  daily, imdur 60mg  daily, Lopressor 25mg  BID and Ranexa 500mg  BID.    He saw Dr. Antoine PocheHochrein in 02/2016 and was doing well.   Today he presents to clinic for evaluation of chest pain and worsening SOB. He works out everyday the [employer] and used to be able to work out for 40 minutes with no issues and now he has been feeling poorly after 2 minutes. He gets exertional chest tightness that is relieved with rest. No LE edema, orthopnea or PND. No dizziness or syncope. No blood in stool or urine.     Past Medical History:  Diagnosis Date  . AS (aortic stenosis)    Mild  . Barrett's esophagus 11/11/2014  . CAD S/P percutaneous coronary angioplasty    7 stents per the patient (no record).  Stent to 90% inferior branch of an OM and mild scattered plaque in other vessels [facility]  01/08/13.  Cath 2016 with patent stents. He did have 70% stenosis in a small LAD that was about 2 mm. Size  . Essential hypertension   . GERD (gastroesophageal reflux disease)   . Glaucoma   . Hypercoagulable state (HCC)   . Hyperlipidemia   . Joint pain   . Lupus (HCC)   . Pneumonitis    Lupus  . Uses hearing aid     Past Surgical History:  Procedure Laterality Date  . CARDIAC CATHETERIZATION N/A 04/01/2015   Procedure: Left Heart Cath and Coronary Angiography;  Surgeon: Marykay Lexavid W Harding, MD;  [facility];  Service: Cardiovascular;  Laterality: N/A;  . CATARACT EXTRACTION     x 2  . COLONOSCOPY    . KNEE SURGERY     bilateral  . NASAL SINUS SURGERY    . TONSILLECTOMY AND ADENOIDECTOMY    . UPPER GASTROINTESTINAL ENDOSCOPY      Current Medications: Outpatient Medications Prior to Visit  Medication Sig Dispense Refill  . amLODipine (NORVASC) 5 MG tablet Take 1 tablet (5 mg total) by mouth daily. 30 tablet 8  . brimonidine-timolol (COMBIGAN) 0.2-0.5 % ophthalmic solution Place 1 drop into the right eye every 12 (twelve) hours.     . Flaxseed, Linseed, (FLAX SEED OIL PO) Take 1 tablet by mouth daily.    . metoprolol tartrate (LOPRESSOR) 25 MG tablet Take 0.5 tablets (12.5 mg total) by mouth 2 (two) times daily. 180 tablet 3  . Multiple Vitamin (MULTIVITAMIN WITH MINERALS) TABS tablet Take 1 tablet by mouth daily.    Marland Kitchen. NASONEX 50 MCG/ACT nasal spray Place 2 sprays into  the nose daily.     . Omega-3 Fatty Acids (FISH OIL PO) Take 3 capsules by mouth daily.     Marland Kitchen omeprazole (PRILOSEC) 40 MG capsule TAKE ONE CAPSULE BY MOUTH DAILY 90 capsule 2  . Probiotic Product (VSL#3 PO) Take 1 capsule by mouth daily. Take for a month    . ramipril (ALTACE) 5 MG capsule TAKE ONE CAPSULE BY MOUTH DAILY 30 capsule 0  . RANEXA 500 MG 12 hr tablet TAKE 1 TABLET (500 MG TOTAL) BY MOUTH 2 (TWO) TIMES DAILY. 180 tablet 0  . rosuvastatin (CRESTOR) 5 MG tablet Take 1 tablet (5mg ) by mouth on Monday, Wednesday, Friday 15 tablet 11  . warfarin (COUMADIN) 4 MG tablet TAKE AS DIRECTED BY ANTICOAGULATION CLINIC 90 tablet 1  . aspirin EC 325 MG tablet Take 325 mg by mouth daily.     . isosorbide mononitrate (IMDUR) 60 MG 24 hr tablet Take 1 tablet (60 mg total) by mouth daily. 90  tablet 3   No facility-administered medications prior to visit.      Allergies:   Sulfa antibiotics   Social History   Social History  . Marital status: Married    Spouse name: N/A  . Number of children: 2  . Years of education: N/A   Social History Main Topics  . Smoking status: Never Smoker  . Smokeless tobacco: Never Used  . Alcohol use 3.5 oz/week    7 Standard drinks or equivalent per week  . Drug use: No  . Sexual activity: Not Asked   Other Topics Concern  . None   Social History Narrative   Married for 42 years   Has two grown children and three grandchildren.    Retired on disability   No longer have pets. Moved from a home to an apartment   Likes to read,workout, travel.      Family History:  The patient's family history includes Arthritis in his father and mother; CAD (age of onset: 72) in his father; Diabetes in his father and mother; Hypertension in his father and mother.     ROS:   Please see the history of present illness.    ROS All other systems reviewed and are negative.   PHYSICAL EXAM:   VS:  BP 124/72   Pulse 61   Ht 5' 8.5" (1.74 m)   Wt 162 lb 6.4 oz (73.7 kg)   BMI 24.33 kg/m    GEN: Well nourished, well developed, in no acute distress  HEENT: normal  Neck: no JVD, carotid bruits, or masses Cardiac: RRR; apical systolic murmur at the apex and slightly radiating, rubs, or gallops,no edema  Respiratory:  clear to auscultation bilaterally, normal work of breathing GI: soft, nontender, nondistended, + BS MS: no deformity or atrophy  Skin: warm and dry, no rash Neuro:  Alert and Oriented x 3, Strength and sensation are intact Psych: euthymic mood, full affect  Wt Readings from Last 3 Encounters:  06/13/16 162 lb 6.4 oz (73.7 kg)  03/08/16 160 lb (72.6 kg)  01/17/16 162 lb 4 oz (73.6 kg)      Studies/Labs Reviewed:   EKG:  EKG is ordered today.  The ekg ordered today demonstrates sinus with 1st deg AV block HR 61  Recent  Labs: 02/28/2016: ALT 19; BUN 17; Creat 0.73; Hemoglobin 11.9; Platelets 177; Potassium 4.8; Sodium 140   Lipid Panel    Component Value Date/Time   CHOL 96 (L) 02/28/2016 0838   TRIG 116 02/28/2016  4098   HDL 24 (L) 02/28/2016 0838   CHOLHDL 4.0 02/28/2016 0838   VLDL 23 02/28/2016 0838   LDLCALC 49 02/28/2016 0838    Additional studies/ records that were reviewed today include:   03/2015 Conclusion   Moderate to severe diffuse disease from the proximal to mid and distal LAD  Prox LAD to Mid LAD lesion, 5% stenosed. The lesion was previously treated with a stent (unknown type) .  Ost LAD lesion, 50% stenosed. Ost LAD to Prox LAD lesion, 50% stenosed. Mid Cx to Dist Cx lesion, 65% stenosed.  Dist LAD lesion, 70% stenosed.  Ost 1st Diag to 1st Diag lesion, 10% stenosed. The lesion was previously treated with a stent (unknown type) .  Ost Cx to Mid Cx lesion, 10% stenosed. The lesion was previously treated with a drug-eluting stent greater than two years ago.  Mid LAD to Dist LAD lesion, 60% stenosed.  Prox RCA to Mid RCA lesion, 20% stenosed. The lesion was previously treated with a stent (unknown type) .  Mid RCA lesion, 30% stenosed.  There is mild left ventricular systolic dysfunction. EF ~45%   Very difficult situation with extensive disease in the LAD which could quite easily be causing angina. Unfortunately there are not great PCI options as the size of the vessel is maybe 2 mm in greatest and the length of the the diffuse disease is extremely long. I will review images with the other interventional colleagues, but for now would prefer to continue management medical management and discussed the possibility of relatively extensive PCI to the LAD versus even possible single vessel CABG if he has progressive disease.     ASSESSMENT & PLAN:   Chest pain/SOB: patient with exertional chest pain and dyspnea. He had a cath 03/2015 with extensive disease in the LAD which could  quite easily be causing angina. Unfortunately there are not great PCI options as the size of the vessel is maybe 2 mm in greatest and the length of the the diffuse disease is extremely long. Plan was continued medical therapy. He is on  amlodipine 5mg  daily, imdur 60mg  daily, Lopressor 25mg  BID and Ranexa 500mg  BID.  Will plan to increase imdur to 90mg  daily and keep a close eye on him. I will have follow up arranged with Azalee Course PA-C on a day Dr. Antoine Poche is in the office.   CAD: will decrease ASA 325 mg to 81 mg daily. Continue BB and statin.   HTN:  This is being treated in the context of managing his coronary disease.  Carotid artery disease: mild plaque on 02/2014 carotid dopplers.   AS: mild by 2D ECHO in 2015. Plan is for repeat echo in 02/2017  Hx of DVT/PE: continue coumadin   HLD: continue statin. LDL at goal  Medication Adjustments/Labs and Tests Ordered: Current medicines are reviewed at length with the patient today.  Concerns regarding medicines are outlined above.  Medication changes, Labs and Tests ordered today are listed in the Patient Instructions below. Patient Instructions  Medication Instructions:  Your physician has recommended you make the following change in your medication:  1.  INCREASE the Imdur to 60 mg taking 1 1/2 tablet daily 2.  STOP Aspirin 325 mg and START Aspirin 81 mg taking 1 tablet daily   Labwork: None ordered  Testing/Procedures: None ordered  Follow-Up: Your physician recommends that you schedule a follow-up appointment in: 06/25/16 WITH HAO MENG, PA-C AT THE [facility] OFFICE   Any Other Special Instructions Will  Be Listed Below (If Applicable).     If you need a refill on your cardiac medications before your next appointment, please call your pharmacy.     Signed, Cline Crock, PA-C  06/13/2016 3:03 PM    [facility] [address] [place], [place], [place]  [address] Phone: [phone]; Fax: [phone]

## 2016-06-13 NOTE — Telephone Encounter (Signed)
Patient having chest pain which has been ongoing, intermittent x ~1 month. Happened this AM after a 25 minute walk. He is now working in the yard w no exertional dyspnea or chest pain at present.  Pt denies this being emergent in nature. He is aware to go to ED if returns and is worse.  Pt is on meds as noted, no changes since last seen.  He wished to see provider I have made him aware that Dr. Antoine PocheHochrein currently not available until October. Pt agreeable to see a PA. He would like to be seen this afternoon or later on this week - has a dental appt this AM.  I am aware there are available slots today that needed to be filled. I have sent a request to scheduling and patient is aware I will call him back w/ availability.

## 2016-06-13 NOTE — Telephone Encounter (Signed)
New message   Pt wife verbalized that pt is very weak with chest pain at times  No chest pain NOW  Its only when he exerts himself

## 2016-06-13 NOTE — Patient Instructions (Addendum)
Medication Instructions:  Your physician has recommended you make the following change in your medication:  1.  INCREASE the Imdur to 60 mg taking 1 1/2 tablet daily 2.  STOP Aspirin 325 mg and START Aspirin 81 mg taking 1 tablet daily   Labwork: None ordered  Testing/Procedures: None ordered  Follow-Up: Your physician recommends that you schedule a follow-up appointment in: 06/25/16 WITH HAO MENG, PA-C AT THE [facility]   Any Other Special Instructions Will Be Listed Below (If Applicable).     If you need a refill on your cardiac medications before your next appointment, please call your pharmacy.

## 2016-06-22 ENCOUNTER — Encounter: Payer: Self-pay | Admitting: *Deleted

## 2016-06-25 ENCOUNTER — Encounter: Payer: Self-pay | Admitting: Physician Assistant

## 2016-06-25 ENCOUNTER — Ambulatory Visit (INDEPENDENT_AMBULATORY_CARE_PROVIDER_SITE_OTHER): Payer: PPO | Admitting: General Practice

## 2016-06-25 ENCOUNTER — Ambulatory Visit (INDEPENDENT_AMBULATORY_CARE_PROVIDER_SITE_OTHER): Payer: PPO | Admitting: Physician Assistant

## 2016-06-25 VITALS — BP 106/64 | HR 64 | Ht 68.5 in | Wt 162.6 lb

## 2016-06-25 DIAGNOSIS — I10 Essential (primary) hypertension: Secondary | ICD-10-CM

## 2016-06-25 DIAGNOSIS — Z5181 Encounter for therapeutic drug level monitoring: Secondary | ICD-10-CM | POA: Diagnosis not present

## 2016-06-25 DIAGNOSIS — E785 Hyperlipidemia, unspecified: Secondary | ICD-10-CM

## 2016-06-25 DIAGNOSIS — I25111 Atherosclerotic heart disease of native coronary artery with angina pectoris with documented spasm: Secondary | ICD-10-CM

## 2016-06-25 DIAGNOSIS — D6862 Lupus anticoagulant syndrome: Secondary | ICD-10-CM | POA: Diagnosis not present

## 2016-06-25 DIAGNOSIS — I208 Other forms of angina pectoris: Secondary | ICD-10-CM

## 2016-06-25 LAB — POCT INR: INR: 2.9

## 2016-06-25 MED ORDER — RANOLAZINE ER 1000 MG PO TB12: 500.0000 mg | ORAL_TABLET | Freq: Two times a day (BID) | ORAL | 6 refills | Status: DC

## 2016-06-25 MED ORDER — ISOSORBIDE MONONITRATE ER 30 MG PO TB24: 30.0000 mg | ORAL_TABLET | Freq: Every day | ORAL | 6 refills | Status: DC

## 2016-06-25 MED ORDER — METOPROLOL TARTRATE 25 MG PO TABS: 12.5000 mg | ORAL_TABLET | Freq: Two times a day (BID) | ORAL | 6 refills | Status: DC

## 2016-06-25 NOTE — Patient Instructions (Addendum)
Medication Instructions:  INCREASE Ranexa to 1000mg  Twice a day ADD Imdur 30mg  daily at bedtime  Refill for Metoprolol has been sent to your pharmacy.  Labwork: None Ordered  Testing/Procedures: None Ordered  Follow-Up: Your physician recommends that you schedule a follow-up appointment in: 3-4 weeks with Dr Antoine PocheHochrein or Azalee CourseHao Meng PA-C ONLY IF Dr Antoine PocheHochrein is in the office ALL DAY.  Any Other Special Instructions Will Be Listed Below (If Applicable).  Obtain blood pressure TWICE a day. Bring Blood Pressure log with you to your next visit.   NEXT APPT:  DATE:______________________________________________  TIME:______________________________________________   If you need a refill on your cardiac medications before your next appointment, please call your pharmacy.

## 2016-06-25 NOTE — Progress Notes (Signed)
Cardiology Office Note    Date:  06/25/2016   ID:  [name], DOB Oct 29, 1950, MRN [id number]  PCP:  Shirline Frees, NP  Cardiologist:  Dr. Antoine Poche  Chief Complaint  Patient presents with  . Follow-up    seen for Dr. Antoine Poche, exertional angina    History of Present Illness:  [name] is a 65 y.o. male with mild AS, lupus, GERD, DVT/PE on coumadin, HTN, HLD, CAD s/p multiple stenting. He has extensive history of CAD and reports 7 stents in the past at [facility]. Last cath in May 2016 demonstrated patent stents in the LAD, left circumflex, RCA with 70% stenosis in small LAD. No good PCI targets. He was managed medically. His he has been on Lopressor, Imdur, amlodipine and the Ranexa. He was seen by Dr. Antoine Poche in May 2017 and was doing well at the time. Recently, he has been noticing increasing exertional shortness of breath and chest discomfort. He was seen in the cardiology clinic on 06/13/2016, his Imdur was increased to 90 mg daily as it was felt that his LAD was too small for stenting. He presents today for cardiology office visit.  He presents today for 2 weeks follow-up, from cardiology perspective, he has been doing relatively well. He still has exertional angina, however no angina at rest. We did review the previous cath film from June 2016 which showed small diseased LAD territory which make it very difficult for PCI. After increasing the Imdur, he was able to exercise for a hour in the morning without any chest discomfort. However in the afternoon, he would notice some of the exertional chest discomfort and shortness of breath coming back. His blood pressure is borderline. I will increase his Ranexa to 1000 mg twice a day. We will also add 30 mg Imdur to his afternoon dose on top of 90 mg Imdur in the morning as majority of his current exertional symptom seems to occur in the afternoon. We will also see him back in 3-4 weeks for further assessment of exertional  symptom.   Past Medical History:  Diagnosis Date  . AS (aortic stenosis)    Mild  . Barrett's esophagus 11/11/2014  . CAD S/P percutaneous coronary angioplasty    7 stents per the patient (no record).  Stent to 90% inferior branch of an OM and mild scattered plaque in other vessels [facility]  01/08/13.  Cath 2016 with patent stents. He did have 70% stenosis in a small LAD that was about 2 mm. Size  . Essential hypertension   . GERD (gastroesophageal reflux disease)   . Glaucoma   . Hypercoagulable state (HCC)   . Hyperlipidemia   . Joint pain   . Lupus (HCC)   . Pneumonitis    Lupus  . Uses hearing aid     Past Surgical History:  Procedure Laterality Date  . CARDIAC CATHETERIZATION N/A 04/01/2015   Procedure: Left Heart Cath and Coronary Angiography;  Surgeon: Marykay Lex, MD;  Location: [facility] INVASIVE CV LAB;  Service: Cardiovascular;  Laterality: N/A;  . CATARACT EXTRACTION     x 2  . COLONOSCOPY    . KNEE SURGERY     bilateral  . NASAL SINUS SURGERY    . TONSILLECTOMY AND ADENOIDECTOMY    . UPPER GASTROINTESTINAL ENDOSCOPY      Current Medications: Outpatient Medications Prior to Visit  Medication Sig Dispense Refill  . amLODipine (NORVASC) 5 MG tablet Take 1 tablet (5 mg total) by  mouth daily. 30 tablet 8  . aspirin EC 81 MG tablet Take 1 tablet (81 mg total) by mouth daily. 90 tablet 3  . brimonidine-timolol (COMBIGAN) 0.2-0.5 % ophthalmic solution Place 1 drop into the right eye every 12 (twelve) hours.     . Flaxseed, Linseed, (FLAX SEED OIL PO) Take 1 tablet by mouth daily.    . isosorbide mononitrate (IMDUR) 60 MG 24 hr tablet Take 1.5 tablets (90 mg total) by mouth daily. 45 tablet 11  . Multiple Vitamin (MULTIVITAMIN WITH MINERALS) TABS tablet Take 1 tablet by mouth daily.    Marland Kitchen. NASONEX 50 MCG/ACT nasal spray Place 2 sprays into the nose daily.     . Omega-3 Fatty Acids (FISH OIL PO) Take 3 capsules by mouth daily.     Marland Kitchen. omeprazole (PRILOSEC) 40 MG capsule  TAKE ONE CAPSULE BY MOUTH DAILY 90 capsule 2  . Probiotic Product (VSL#3 PO) Take 1 capsule by mouth daily. Take for a month    . ramipril (ALTACE) 5 MG capsule TAKE ONE CAPSULE BY MOUTH DAILY 30 capsule 0  . rosuvastatin (CRESTOR) 5 MG tablet Take 1 tablet (5mg ) by mouth on Monday, Wednesday, Friday 15 tablet 11  . warfarin (COUMADIN) 4 MG tablet TAKE AS DIRECTED BY ANTICOAGULATION CLINIC 90 tablet 1  . metoprolol tartrate (LOPRESSOR) 25 MG tablet Take 0.5 tablets (12.5 mg total) by mouth 2 (two) times daily. 180 tablet 3  . RANEXA 500 MG 12 hr tablet TAKE 1 TABLET (500 MG TOTAL) BY MOUTH 2 (TWO) TIMES DAILY. 180 tablet 0   No facility-administered medications prior to visit.      Allergies:   Sulfa antibiotics   Social History   Social History  . Marital status: Married    Spouse name: N/A  . Number of children: 2  . Years of education: N/A   Social History Main Topics  . Smoking status: Never Smoker  . Smokeless tobacco: Never Used  . Alcohol use 3.5 oz/week    7 Standard drinks or equivalent per week  . Drug use: No  . Sexual activity: Not Asked   Other Topics Concern  . None   Social History Narrative   Married for 42 years   Has two grown children and three grandchildren.    Retired on disability   No longer have pets. Moved from a home to an apartment   Likes to read,workout, travel.      Family History:  The patient's family history includes Arthritis in his father and mother; CAD (age of onset: 6860) in his father; Diabetes in his father and mother; Hypertension in his father and mother.   ROS:   Please see the history of present illness.    ROS All other systems reviewed and are negative.   PHYSICAL EXAM:   VS:  BP 106/64   Pulse 64   Ht 5' 8.5" (1.74 m)   Wt 162 lb 9.6 oz (73.8 kg)   BMI 24.36 kg/m    GEN: Well nourished, well developed, in no acute distress  HEENT: normal  Neck: no JVD, carotid bruits, or masses Cardiac: RRR; no rubs, or gallops,no  edema  +2/6 systolic murmur Respiratory:  clear to auscultation bilaterally, normal work of breathing GI: soft, nontender, nondistended, + BS MS: no deformity or atrophy  Skin: warm and dry, no rash Neuro:  Alert and Oriented x 3, Strength and sensation are intact Psych: euthymic mood, full affect  Wt Readings from Last 3 Encounters:  06/25/16 162 lb 9.6 oz (73.8 kg)  06/13/16 162 lb 6.4 oz (73.7 kg)  03/08/16 160 lb (72.6 kg)      Studies/Labs Reviewed:   EKG:  EKG is not ordered today.   Recent Labs: 02/28/2016: ALT 19; BUN 17; Creat 0.73; Hemoglobin 11.9; Platelets 177; Potassium 4.8; Sodium 140   Lipid Panel    Component Value Date/Time   CHOL 96 (L) 02/28/2016 0838   TRIG 116 02/28/2016 0838   HDL 24 (L) 02/28/2016 0838   CHOLHDL 4.0 02/28/2016 0838   VLDL 23 02/28/2016 0838   LDLCALC 49 02/28/2016 0838    Additional studies/ records that were reviewed today include:    Echo 03/02/2015 LV EF: 55% -  60%  ------------------------------------------------------------------- Indications:   786.05 Dyspnea.  ------------------------------------------------------------------- History:  PMH: Acquired from the patient and from the patient&'s chart. Dyspnea and murmur. Risk factors: Hypertension. Dyslipidemia.  ------------------------------------------------------------------- Study Conclusions  - Left ventricle: The cavity size was normal. Wall thickness was increased in a pattern of mild LVH. Systolic function was normal. The estimated ejection fraction was in the range of 55% to 60%. - Aortic valve: There was mild stenosis. There was trivial regurgitation. - Left atrium: The atrium was mildly dilated. - Atrial septum: No defect or patent foramen ovale was identified.   Cath 04/01/2015   Moderate to severe diffuse disease from the proximal to mid and distal LAD  Prox LAD to Mid LAD lesion, 5% stenosed. The lesion was previously treated with a  stent (unknown type) .  Ost LAD lesion, 50% stenosed. Ost LAD to Prox LAD lesion, 50% stenosed. Mid Cx to Dist Cx lesion, 65% stenosed.  Dist LAD lesion, 70% stenosed.  Ost 1st Diag to 1st Diag lesion, 10% stenosed. The lesion was previously treated with a stent (unknown type) .  Ost Cx to Mid Cx lesion, 10% stenosed. The lesion was previously treated with a drug-eluting stent greater than two years ago.  Mid LAD to Dist LAD lesion, 60% stenosed.  Prox RCA to Mid RCA lesion, 20% stenosed. The lesion was previously treated with a stent (unknown type) .  Mid RCA lesion, 30% stenosed.  There is mild left ventricular systolic dysfunction. EF ~45%   Very difficult situation with extensive disease in the LAD which could quite easily be causing angina. Unfortunately there are not great PCI options as the size of the vessel is maybe 2 mm in greatest and the length of the the diffuse disease is extremely long. I will review images with the other interventional colleagues, but for now would prefer to continue management medical management and discussed the possibility of relatively extensive PCI to the LAD versus even possible single vessel CABG if he has progressive disease.   Standard post radial cath care with TR band removal. The patient will be discharged home today to follow-up with Dr. Antoine Poche. We will discuss the results and, with the plan of management.  Restart home medications.       ASSESSMENT:    1. Exertional angina (HCC)   2. Coronary artery disease involving native coronary artery of native heart with angina pectoris with documented spasm (HCC)   3. Essential hypertension   4. HLD (hyperlipidemia)      PLAN:  In order of problems listed above:  1. Exertional angina: Stable exertional angina controlled on Imdur. Denies any resting chest discomfort. Symptoms increasing the Imdur has helped controlling some of his symptom. He says he was able to exercise a hour in  the  morning without discomfort, however he does begin to notice more exertional symptom later in the day. I will continue on the 90 mg every morning of Imdur and add an additional 30 mg for the afternoon. We will also increase his Ranexa to 1000 mg twice a day. I will have him follow back in 3-4 weeks for reassessment of symptoms.  2. CAD: Known diffuse disease in tiny LAD on previous cath in 2016, however because the LAD diameter is no more than 2 mm, it was very difficult to treat via PCI. Currently appears to have stable angina, we'll treat medically by uptitrating antianginal medication. However patient does understand that if he started having chest discomfort at rest unrelieved with nitroglycerin, he will need to seek medical attention.  3. HTN: Blood pressure borderline, will continue to uptitrate antianginal medication  4. HLD: Last lipid panel obtained on 02/28/2016 showed well-controlled cholesterol profile with total cholesterol 96, triglyceride 116, HDL 24, LDL 49.    Medication Adjustments/Labs and Tests Ordered: Current medicines are reviewed at length with the patient today.  Concerns regarding medicines are outlined above.  Medication changes, Labs and Tests ordered today are listed in the Patient Instructions below. Patient Instructions  Medication Instructions:  INCREASE Ranexa to 1000mg  Twice a day ADD Imdur 30mg  daily at bedtime  Refill for Metoprolol has been sent to your pharmacy.  Labwork: None Ordered  Testing/Procedures: None Ordered  Follow-Up: Your physician recommends that you schedule a follow-up appointment in: 3-4 weeks with Dr Antoine Poche or Azalee Course PA-C ONLY IF Dr Antoine Poche is in the office ALL DAY.  Any Other Special Instructions Will Be Listed Below (If Applicable).  Obtain blood pressure TWICE a day. Bring Blood Pressure log with you to your next visit.   NEXT  APPT:  DATE:______________________________________________  TIME:______________________________________________   If you need a refill on your cardiac medications before your next appointment, please call your pharmacy.     Ramond Dial, [place]  06/25/2016 3:42 PM    [facility] [address] [place], [place], [place]  [address] Phone: [phone]; Fax: [phone]

## 2016-06-25 NOTE — Progress Notes (Signed)
I agree with this plan.

## 2016-06-28 ENCOUNTER — Other Ambulatory Visit: Payer: Self-pay | Admitting: Adult Health

## 2016-06-28 NOTE — Telephone Encounter (Signed)
Ok to refill for one year 90 +3 

## 2016-07-02 ENCOUNTER — Ambulatory Visit: Payer: PPO | Admitting: Physician Assistant

## 2016-07-06 ENCOUNTER — Other Ambulatory Visit: Payer: Self-pay | Admitting: Cardiovascular Disease

## 2016-07-16 ENCOUNTER — Ambulatory Visit (INDEPENDENT_AMBULATORY_CARE_PROVIDER_SITE_OTHER): Payer: PPO | Admitting: Physician Assistant

## 2016-07-16 ENCOUNTER — Encounter: Payer: Self-pay | Admitting: Physician Assistant

## 2016-07-16 VITALS — BP 122/76 | HR 67 | Ht 68.5 in | Wt 162.0 lb

## 2016-07-16 DIAGNOSIS — E785 Hyperlipidemia, unspecified: Secondary | ICD-10-CM | POA: Diagnosis not present

## 2016-07-16 DIAGNOSIS — I10 Essential (primary) hypertension: Secondary | ICD-10-CM | POA: Diagnosis not present

## 2016-07-16 DIAGNOSIS — I208 Other forms of angina pectoris: Secondary | ICD-10-CM

## 2016-07-16 DIAGNOSIS — I2510 Atherosclerotic heart disease of native coronary artery without angina pectoris: Secondary | ICD-10-CM

## 2016-07-16 DIAGNOSIS — R011 Cardiac murmur, unspecified: Secondary | ICD-10-CM

## 2016-07-16 MED ORDER — ISOSORBIDE MONONITRATE ER 60 MG PO TB24: 60.0000 mg | ORAL_TABLET | Freq: Every day | ORAL | 0 refills | Status: DC

## 2016-07-16 MED ORDER — ISOSORBIDE MONONITRATE ER 120 MG PO TB24: 120.0000 mg | ORAL_TABLET | Freq: Every day | ORAL | 0 refills | Status: DC

## 2016-07-16 NOTE — Patient Instructions (Signed)
Medications:  Take Isosorbide 180 mg daily. (One 60 mg tablet with one 120 mg tablet).   Testing:  Your physician has requested that you have an echocardiogram. Echocardiography is a painless test that uses sound waves to create images of your heart. It provides your doctor with information about the size and shape of your heart and how well your heart's chambers and valves are working. This procedure takes approximately one hour. There are no restrictions for this procedure. [address]   Follow-Up:  Your physician recommends that you schedule a follow-up appointment in: 4-6 weeks with Dr. Antoine PocheHochrein only.  If you need a refill on your cardiac medications before your next appointment, please call your pharmacy.

## 2016-07-16 NOTE — Progress Notes (Signed)
Cardiology Office Note    Date:  07/16/2016   ID:  KINSLEY NICKLAUS, DOB 11/28/1950, MRN [id number]  PCP:  Shirline Frees, NP  Cardiologist:  Dr. Antoine Poche  Chief Complaint  Patient presents with  . Follow-up    seen for Dr.. Antoine Poche    History of Present Illness:  [name] is a 65 y.o. male with mild AS, lupus, GERD, DVT/PE on coumadin, HTN, HLD, CAD s/p multiple stenting. He has extensive history of CAD and reports 7 stents in the past at [facility]. Last cath in May 2016 demonstrated patent stents in the LAD, left circumflex and RCA with 70% stenosis in small LAD. No good PCI targets. He was managed medically. His he has been on Lopressor, Imdur, amlodipine and the Ranexa. He was seen by Dr. Antoine Poche in May 2017 and was doing well at the time. Recently, he has been noticing increasing exertional shortness of breath and chest discomfort. He was seen in the cardiology clinic on 06/13/2016, his Imdur was increased to 90 mg daily as it was felt that his LAD was too small for stenting.  I last saw the patient on 06/25/2016, at which time he still had a exertional angina, however no angina at rest. We reviewed the previous cath film from 2016. Majority of his exertional symptoms seems to occur in the afternoon. Increasing the Imdur did help with his chest discomfort especially in the morning. He was able to walk for roughly an hour in the morning without any chest discomfort. His blood pressure was borderline, I increased his Ranexa to 1000 mg twice a day. We also added 30 mg Imdur to his afternoon medication in an effort to medically manage his exertional angina.  He returned today for 3-4 weeks cardiology follow-up, unfortunately, he still have exertional symptom. He also had increased fatigue. He says there was one day his blood pressure did drop down to the 90s, therefore he went back down to 500 mg twice a day of Ranexa. Since cutting back on Ranexa, his blood pressure has normalized. I find  it strange that Ranexa was responsible for dropping his blood pressure as it is not one of its usual side effect. However he is no longer having low blood pressure. He still have exertional symptom especially in the afternoon time. I have discussed with Dr. Antoine Poche regarding his previous cath result, he recommended increasing Imdur to 180 mg today, and if he still have angina, eventually increase it to 240 mg daily. He does have a heart murmur on physical exam, he has not had a echocardiogram since May 2015, I will obtain a repeat echocardiogram to reassess the cardiac murmur.   Past Medical History:  Diagnosis Date  . AS (aortic stenosis)    Mild  . Barrett's esophagus 11/11/2014  . CAD S/P percutaneous coronary angioplasty    7 stents per the patient (no record).  Stent to 90% inferior branch of an OM and mild scattered plaque in other vessels [facility]  01/08/13.  Cath 2016 with patent stents. He did have 70% stenosis in a small LAD that was about 2 mm. Size  . Essential hypertension   . GERD (gastroesophageal reflux disease)   . Glaucoma   . Hypercoagulable state (HCC)   . Hyperlipidemia   . Joint pain   . Lupus   . Pneumonitis    Lupus  . Uses hearing aid     Past Surgical History:  Procedure Laterality Date  . CARDIAC  CATHETERIZATION N/A 04/01/2015   Procedure: Left Heart Cath and Coronary Angiography;  Surgeon: Marykay Lex, MD;  Location: [facility] INVASIVE CV LAB;  Service: Cardiovascular;  Laterality: N/A;  . CATARACT EXTRACTION     x 2  . COLONOSCOPY    . KNEE SURGERY     bilateral  . NASAL SINUS SURGERY    . TONSILLECTOMY AND ADENOIDECTOMY    . UPPER GASTROINTESTINAL ENDOSCOPY      Current Medications: Outpatient Medications Prior to Visit  Medication Sig Dispense Refill  . amLODipine (NORVASC) 5 MG tablet Take 1 tablet (5 mg total) by mouth daily. 30 tablet 8  . aspirin EC 81 MG tablet Take 1 tablet (81 mg total) by mouth daily. 90 tablet 3  . brimonidine-timolol  (COMBIGAN) 0.2-0.5 % ophthalmic solution Place 1 drop into the right eye every 12 (twelve) hours.     . Flaxseed, Linseed, (FLAX SEED OIL PO) Take 1 tablet by mouth daily.    . metoprolol tartrate (LOPRESSOR) 25 MG tablet Take 0.5 tablets (12.5 mg total) by mouth 2 (two) times daily. 60 tablet 6  . Multiple Vitamin (MULTIVITAMIN WITH MINERALS) TABS tablet Take 1 tablet by mouth daily.    Marland Kitchen NASONEX 50 MCG/ACT nasal spray Place 2 sprays into the nose daily.     . Omega-3 Fatty Acids (FISH OIL PO) Take 3 capsules by mouth daily.     Marland Kitchen omeprazole (PRILOSEC) 40 MG capsule TAKE ONE CAPSULE BY MOUTH DAILY 90 capsule 2  . Probiotic Product (VSL#3 PO) Take 1 capsule by mouth daily. Take for a month    . ramipril (ALTACE) 5 MG capsule TAKE ONE CAPSULE BY MOUTH DAILY 90 capsule 3  . RANEXA 500 MG 12 hr tablet TAKE 1 TABLET (500 MG TOTAL) BY MOUTH 2 (TWO) TIMES DAILY. 180 tablet 3  . rosuvastatin (CRESTOR) 5 MG tablet Take 1 tablet (5mg ) by mouth on Monday, Wednesday, Friday 15 tablet 11  . warfarin (COUMADIN) 4 MG tablet TAKE AS DIRECTED BY ANTICOAGULATION CLINIC 90 tablet 1  . isosorbide mononitrate (IMDUR) 30 MG 24 hr tablet Take 1 tablet (30 mg total) by mouth at bedtime. 30 tablet 6  . isosorbide mononitrate (IMDUR) 60 MG 24 hr tablet Take 1.5 tablets (90 mg total) by mouth daily. 45 tablet 11  . ranolazine (RANEXA) 1000 MG SR tablet Take 1 tablet (1,000 mg total) by mouth 2 (two) times daily. 60 tablet 6   No facility-administered medications prior to visit.      Allergies:   Sulfa antibiotics   Social History   Social History  . Marital status: Married    Spouse name: N/A  . Number of children: 2  . Years of education: N/A   Social History Main Topics  . Smoking status: Never Smoker  . Smokeless tobacco: Never Used  . Alcohol use 3.5 oz/week    7 Standard drinks or equivalent per week  . Drug use: No  . Sexual activity: Not Asked   Other Topics Concern  . None   Social History  Narrative   Married for 42 years   Has two grown children and three grandchildren.    Retired on disability   No longer have pets. Moved from a home to an apartment   Likes to read,workout, travel.      Family History:  The patient's family history includes Arthritis in his father and mother; CAD (age of onset: 57) in his father; Diabetes in his father and mother; Hypertension  in his father and mother.   ROS:   Please see the history of present illness.    ROS All other systems reviewed and are negative.   PHYSICAL EXAM:   VS:  BP 122/76   Pulse 67   Ht 5' 8.5" (1.74 m)   Wt 162 lb (73.5 kg)   BMI 24.27 kg/m    GEN: Well nourished, well developed, in no acute distress  HEENT: normal  Neck: no JVD, carotid bruits, or masses Cardiac: RRR; no rubs, or gallops,no edema  1/6 systolic murmur Respiratory:  clear to auscultation bilaterally, normal work of breathing GI: soft, nontender, nondistended, + BS MS: no deformity or atrophy  Skin: warm and dry, no rash Neuro:  Alert and Oriented x 3, Strength and sensation are intact Psych: euthymic mood, full affect  Wt Readings from Last 3 Encounters:  07/16/16 162 lb (73.5 kg)  06/25/16 162 lb 9.6 oz (73.8 kg)  06/13/16 162 lb 6.4 oz (73.7 kg)      Studies/Labs Reviewed:   EKG:  EKG is not ordered today.    Recent Labs: 02/28/2016: ALT 19; BUN 17; Creat 0.73; Hemoglobin 11.9; Platelets 177; Potassium 4.8; Sodium 140   Lipid Panel    Component Value Date/Time   CHOL 96 (L) 02/28/2016 0838   TRIG 116 02/28/2016 0838   HDL 24 (L) 02/28/2016 0838   CHOLHDL 4.0 02/28/2016 0838   VLDL 23 02/28/2016 0838   LDLCALC 49 02/28/2016 0838    Additional studies/ records that were reviewed today include:    Echo 03/02/2015 LV EF: 55% -  60%  ------------------------------------------------------------------- Indications:   786.05 Dyspnea.  ------------------------------------------------------------------- History:  PMH:  Acquired from the patient and from the patient&'s chart. Dyspnea and murmur. Risk factors: Hypertension. Dyslipidemia.  ------------------------------------------------------------------- Study Conclusions  - Left ventricle: The cavity size was normal. Wall thickness was increased in a pattern of mild LVH. Systolic function was normal. The estimated ejection fraction was in the range of 55% to 60%. - Aortic valve: There was mild stenosis. There was trivial regurgitation. - Left atrium: The atrium was mildly dilated. - Atrial septum: No defect or patent foramen ovale was identified.   Cath 04/01/2015   Moderate to severe diffuse disease from the proximal to mid and distal LAD  Prox LAD to Mid LAD lesion, 5% stenosed. The lesion was previously treated with a stent (unknown type) .  Ost LAD lesion, 50% stenosed. Ost LAD to Prox LAD lesion, 50% stenosed. Mid Cx to Dist Cx lesion, 65% stenosed.  Dist LAD lesion, 70% stenosed.  Ost 1st Diag to 1st Diag lesion, 10% stenosed. The lesion was previously treated with a stent (unknown type) .  Ost Cx to Mid Cx lesion, 10% stenosed. The lesion was previously treated with a drug-eluting stent greater than two years ago.  Mid LAD to Dist LAD lesion, 60% stenosed.  Prox RCA to Mid RCA lesion, 20% stenosed. The lesion was previously treated with a stent (unknown type) .  Mid RCA lesion, 30% stenosed.  There is mild left ventricular systolic dysfunction. EF ~45%  Very difficult situation with extensive disease in the LAD which could quite easily be causing angina. Unfortunately there are not great PCI options as the size of the vessel is maybe 2 mm in greatest and the length of the the diffuse disease is extremely long. I will review images with the other interventional colleagues, but for now would prefer to continue management medical management and discussed the possibility of  relatively extensive PCI to the LAD versus even  possible single vessel CABG if he has progressive disease.   Standard post radial cath care with TR band removal. The patient will be discharged home today to follow-up with Dr. Antoine Poche. We will discuss the results and, with the plan of management.  Restart home medications.      ASSESSMENT:    1. Exertional angina (HCC)   2. Coronary artery disease involving native coronary artery of native heart without angina pectoris   3. Essential hypertension   4. Hyperlipidemia, unspecified hyperlipidemia type   5. Murmur, cardiac      PLAN:  In order of problems listed above:  1. Exertional angina: Stable exertional angina controlled on Imdur 90mg  AM and 30 mg PM dose of Imdur was added to her last visit along with up titration of Ranexa. Unfortunately, he was unable to tolerate increased dose of Ranexa, he has since been cut back to 500 mg BID. After discussing with Dr. Antoine Poche, we will increase his Imdur to 180 mg daily. If he still has angina, we will plan to increase it to 240mg  daily.  2. CAD: Known diffuse disease in tiny LAD on previous cath in 2016, however because the LAD diameter is no more than 2 mm, it was very difficult to treat via PCI. Currently appears to have stable angina, we'll treat medically by uptitrating antianginal medication. However patient does understand that if he started having chest discomfort at rest unrelieved with nitroglycerin, he will need to seek medical attention.  3. HTN: Blood pressure borderline, will continue to uptitrate antianginal medication  4. HLD: Last lipid panel obtained on 02/28/2016 showed well-controlled cholesterol profile with total cholesterol 96, triglyceride 116, HDL 24, LDL 49.  5. Systolic murmur: unclear if related to his current symptom, his last echo in 2015 did not show significant valvular issue, will obtain repeat echo    Medication Adjustments/Labs and Tests Ordered: Current medicines are reviewed at length with  the patient today.  Concerns regarding medicines are outlined above.  Medication changes, Labs and Tests ordered today are listed in the Patient Instructions below. Patient Instructions  Medications:  Take Isosorbide 180 mg daily. (One 60 mg tablet with one 120 mg tablet).   Testing:  Your physician has requested that you have an echocardiogram. Echocardiography is a painless test that uses sound waves to create images of your heart. It provides your doctor with information about the size and shape of your heart and how well your heart's chambers and valves are working. This procedure takes approximately one hour. There are no restrictions for this procedure. This will be done at [address].   Follow-Up:  Your physician recommends that you schedule a follow-up appointment in: 4-6 weeks with Dr. Antoine Poche only.  If you need a refill on your cardiac medications before your next appointment, please call your pharmacy.        Ramond Dial, [place]  07/16/2016 12:48 PM    [facility] [address] [place], [place], [place]  [address] Phone: [phone]; Fax: [phone]

## 2016-07-30 ENCOUNTER — Other Ambulatory Visit (HOSPITAL_COMMUNITY): Payer: PPO

## 2016-08-02 ENCOUNTER — Other Ambulatory Visit: Payer: Self-pay

## 2016-08-02 ENCOUNTER — Ambulatory Visit (HOSPITAL_COMMUNITY): Payer: PPO | Attending: Cardiovascular Disease

## 2016-08-02 DIAGNOSIS — R011 Cardiac murmur, unspecified: Secondary | ICD-10-CM | POA: Insufficient documentation

## 2016-08-02 DIAGNOSIS — I352 Nonrheumatic aortic (valve) stenosis with insufficiency: Secondary | ICD-10-CM | POA: Diagnosis not present

## 2016-08-02 DIAGNOSIS — I517 Cardiomegaly: Secondary | ICD-10-CM | POA: Diagnosis not present

## 2016-08-02 DIAGNOSIS — E785 Hyperlipidemia, unspecified: Secondary | ICD-10-CM | POA: Insufficient documentation

## 2016-08-02 DIAGNOSIS — I25118 Atherosclerotic heart disease of native coronary artery with other forms of angina pectoris: Secondary | ICD-10-CM | POA: Diagnosis not present

## 2016-08-02 DIAGNOSIS — I208 Other forms of angina pectoris: Secondary | ICD-10-CM | POA: Diagnosis not present

## 2016-08-02 DIAGNOSIS — I340 Nonrheumatic mitral (valve) insufficiency: Secondary | ICD-10-CM | POA: Diagnosis not present

## 2016-08-03 ENCOUNTER — Telehealth: Payer: Self-pay | Admitting: Physician Assistant

## 2016-08-03 NOTE — Telephone Encounter (Signed)
LM with results--OK per DPR. 

## 2016-08-03 NOTE — Telephone Encounter (Signed)
-----   Message from [place], [place] sent at 08/03/2016  4:22 PM EDT ----- Normal pumping function of heart, mildly leaky mitral valve, his aortic valve also moderately tight, but does not need to be fixed at this time. Continue monitoring.

## 2016-08-06 ENCOUNTER — Ambulatory Visit: Payer: PPO

## 2016-08-20 ENCOUNTER — Ambulatory Visit (INDEPENDENT_AMBULATORY_CARE_PROVIDER_SITE_OTHER): Payer: PPO | Admitting: General Practice

## 2016-08-20 DIAGNOSIS — Z5181 Encounter for therapeutic drug level monitoring: Secondary | ICD-10-CM

## 2016-08-20 DIAGNOSIS — D6862 Lupus anticoagulant syndrome: Secondary | ICD-10-CM

## 2016-08-20 LAB — POCT INR: INR: 2.8

## 2016-08-20 NOTE — Progress Notes (Signed)
I agree with this plan.

## 2016-08-20 NOTE — Patient Instructions (Signed)
Pre visit review using our clinic review tool, if applicable. No additional management support is needed unless otherwise documented below in the visit note. 

## 2016-08-26 NOTE — Progress Notes (Signed)
HPI The patient presents for follow up of a history of coronary disease.  He has an extensive history of coronary disease and reports 7 stents in the past. (I don't have these records.)  He did have chest pain last year and had a cath in May of last year which demonstrated patent stents in the LAD, circ and RCA with 70% stenosis in a small LAD.  He presented this summer with increased dyspnea.  He was managed medically with increased Imdur.  He came back twice more and had his Imdur increased again and had his Ranexa increased.   However, he did not tolerate the increased Ranexa and had this reduced and his Imdur was increased further.  Since then he has done well.  The patient denies any new symptoms such as chest discomfort, neck or arm discomfort. There has been no new shortness of breath, PND or orthopnea. There have been no reported pain.     Allergies  Allergen Reactions  . Sulfa Antibiotics Other (See Comments)    Makes patient jittery, prefers not to take them.     Current Outpatient Prescriptions  Medication Sig Dispense Refill  . amLODipine (NORVASC) 5 MG tablet Take 1 tablet (5 mg total) by mouth daily. 30 tablet 8  . aspirin EC 81 MG tablet Take 1 tablet (81 mg total) by mouth daily. 90 tablet 3  . brimonidine-timolol (COMBIGAN) 0.2-0.5 % ophthalmic solution Place 1 drop into the right eye every 12 (twelve) hours.     . Flaxseed, Linseed, (FLAX SEED OIL PO) Take 1 tablet by mouth daily.    . isosorbide mononitrate (IMDUR) 120 MG 24 hr tablet Take 1 tablet (120 mg total) by mouth daily. 90 tablet 0  . isosorbide mononitrate (IMDUR) 60 MG 24 hr tablet Take 1 tablet (60 mg total) by mouth daily. 90 tablet 0  . metoprolol tartrate (LOPRESSOR) 25 MG tablet Take 0.5 tablets (12.5 mg total) by mouth 2 (two) times daily. 60 tablet 6  . Multiple Vitamin (MULTIVITAMIN WITH MINERALS) TABS tablet Take 1 tablet by mouth daily.    Marland Kitchen. NASONEX 50 MCG/ACT nasal spray Place 2 sprays into the nose  daily.     . Omega-3 Fatty Acids (FISH OIL PO) Take 3 capsules by mouth daily.     Marland Kitchen. omeprazole (PRILOSEC) 40 MG capsule TAKE ONE CAPSULE BY MOUTH DAILY 90 capsule 2  . Probiotic Product (VSL#3 PO) Take 1 capsule by mouth daily. Take for a month    . ramipril (ALTACE) 5 MG capsule TAKE ONE CAPSULE BY MOUTH DAILY 90 capsule 3  . RANEXA 500 MG 12 hr tablet TAKE 1 TABLET (500 MG TOTAL) BY MOUTH 2 (TWO) TIMES DAILY. 180 tablet 3  . rosuvastatin (CRESTOR) 5 MG tablet Take 1 tablet (5mg ) by mouth on Monday, Wednesday, Friday 15 tablet 11  . warfarin (COUMADIN) 4 MG tablet TAKE AS DIRECTED BY ANTICOAGULATION CLINIC 90 tablet 1   No current facility-administered medications for this visit.     Past Medical History:  Diagnosis Date  . AS (aortic stenosis)    Mild  . Barrett's esophagus 11/11/2014  . CAD S/P percutaneous coronary angioplasty    7 stents per the patient (no record).  Stent to 90% inferior branch of an OM and mild scattered plaque in other vessels [facility]  [facility]/08[facility].  Cath 2016 with patent stents. He did have 70% stenosis in a small LAD that was about 2 mm. Size  . Essential hypertension   .  GERD (gastroesophageal reflux disease)   . Glaucoma   . Hypercoagulable state (HCC)   . Hyperlipidemia   . Joint pain   . Lupus   . Pneumonitis    Lupus  . Uses hearing aid     Past Surgical History:  Procedure Laterality Date  . CARDIAC CATHETERIZATION N/A 04/01/2015   Procedure: Left Heart Cath and Coronary Angiography;  Surgeon: Marykay Lexavid W Harding, MD;  [facility];  Service: Cardiovascular;  Laterality: N/A;  . CATARACT EXTRACTION     x 2  . COLONOSCOPY    . KNEE SURGERY     bilateral  . NASAL SINUS SURGERY    . TONSILLECTOMY AND ADENOIDECTOMY    . UPPER GASTROINTESTINAL ENDOSCOPY      ROS:   As stated in the HPI and negative for all other systems.  PHYSICAL EXAM BP 120/80 (BP Location: Left Arm, Patient Position: Sitting, Cuff Size: Normal)   Pulse 62    Ht 5\' 8"  (1.727 m)   Wt 159 lb 12.8 oz (72.5 kg)   SpO2 99%   BMI 24.30 kg/m  GENERAL:  Well appearing NECK:  No jugular venous distention, waveform within normal limits, carotid upstroke brisk and symmetric, possible left bruit, no thyromegaly LUNGS:  Clear to auscultation bilaterally BACK:  No CVA tenderness CHEST:  Unremarkable HEART:  PMI not displaced or sustained,S1 and S2 within normal limits, no S3, no S4, no clicks, no rubs, apical systolic murmur at the apex and slightly radiating, no diasotlic murmurs ABD:  Flat, positive bowel sounds normal in frequency in pitch, no bruits, no rebound, no guarding, no midline pulsatile mass, no hepatomegaly, no splenomegaly EXT:  2 plus pulses left radial, decreased right radial, no edema, no cyanosis no clubbing.  Lab Results  Component Value Date   CHOL 96 (L) 02/28/2016   TRIG 116 02/28/2016   HDL 24 (L) 02/28/2016   LDLCALC 49 02/28/2016   EKG:  NA  ASSESSMENT AND PLAN  CAD:   He has disease as described.  The patient has no new sypmtoms.  No further cardiovascular testing is indicated.  We will continue with aggressive risk reduction and meds as listed.  HTN:  This is being treated in the context of managing his coronary disease.  DYSLIPIDEMIA:  LDL was 49 as above.  He will remain on meds as listed.   BRUIT:  He had only mild plaque.  No further work up is indicated at this time.   AS:  This was moderate on echo last month.  I will follow this clinically and with repeat echo.   HYPERCOAGUABLE:  Because of his extensive disease he is on both aspirin and warfarin

## 2016-08-27 ENCOUNTER — Encounter: Payer: Self-pay | Admitting: Cardiology

## 2016-08-27 ENCOUNTER — Ambulatory Visit (INDEPENDENT_AMBULATORY_CARE_PROVIDER_SITE_OTHER): Payer: PPO | Admitting: Cardiology

## 2016-08-27 VITALS — BP 120/80 | HR 62 | Ht 68.0 in | Wt 159.8 lb

## 2016-08-27 DIAGNOSIS — I25118 Atherosclerotic heart disease of native coronary artery with other forms of angina pectoris: Secondary | ICD-10-CM

## 2016-08-27 NOTE — Patient Instructions (Signed)
Medication Instructions:  Continue current medications  Labwork: None Ordered  Testing/Procedures: None ordered  Follow-Up: Your physician recommends that you schedule a follow-up appointment in: 4 Months   Any Other Special Instructions Will Be Listed Below (If Applicable).        Happy Thanksgiving   If you need a refill on your cardiac medications before your next appointment, please call your pharmacy.

## 2016-08-29 ENCOUNTER — Encounter: Payer: Self-pay | Admitting: Cardiology

## 2016-09-23 ENCOUNTER — Other Ambulatory Visit: Payer: Self-pay | Admitting: Adult Health

## 2016-09-24 NOTE — Progress Notes (Signed)
Subjective:   [name] is a 65 y.o. male who presents for an Initial Medicare Annual Wellness Visit.  The Patient was informed that the wellness visit is to identify future health risk and educate and initiate measures that can reduce risk for increased disease through the lifespan.    NO ROS; Medicare Wellness Visit Has been disabled x 6 years;  Had lupus; heart disease, lung disease and glaucoma   Psychosocial;  Married; 2 children  [place]ired on d/a [place]   Describes health as good, fair or great? Stable   Preventive Screening -Counseling & Management  Hearing aids/ left hearing aid  Right; 2000hz   Colonoscopy 10/2014  Meds:  Doubled isosorbide this past summer Slight dizziness at times; MD is not concerned  Take both at lunch  Taking tums for indigestion which does help    Current smoking/ tobacco status/ never smoked  Second Hand Smoke status; No Smokers in the home  ETOH: stopped using ETOH    RISK FACTORS Regular exercise  Does 60 to 90 minutes at the [employer] Do weights; some aerobics; did the elliptical;  At least 300 minutes a day   Diet Breakfast; cereal, ? Fruit  Eat after he exercises, he eats  Lunch; sandwich or left overs Supper; goes out to eat some;  Wife teaches grand children with their dtr  Fall risk ; tripped in the year but did not get hurt Works in the yard  Educated on fall prevention  Mobility of Functional changes this year? No  Safety; community, wears sunscreen, safe place for firearms; Motor vehicle accidents; no   Cardiac Risk Factors:  Advanced aged > 55 in men;  7 stents per the patient / cath 03/2016  Has had 4 to 5 times; now they can't stent (to small)  Hyperlipidemia - HDL 24; LDL 49 ; Trig 116 Diabetes- A1c 5,8 Family History (mother had DM, HTN; OA; Fahter had DM HTN; cad)  Obesity - BMI 24   Eye exam/ vision is not good Does not remember eye doctor's name   Depression Screen PhQ 2: negative  Activities  of Daily Living - See functional screen   Cognitive testing; Ad8 score; 0 or less than 2  MMSE deferred or completed if AD8 + 2 issues  Advanced Directives  Patient Care Team: Shirline Freesory Nafziger, NP as PCP - General (Family Medicine) Dr Hollie BeachHochrein Eye doctor ? [facility]  Checks glaucoma and MD  ? [facility] ?  Immunization History  Administered Date(s) Administered  . Influenza Split 06/22/2013  . Influenza,inj,Quad PF,36+ Mos 08/02/2014, 07/28/2015  . Tdap 08/11/2014   Required Immunizations needed today  Screening test up to date or reviewed for plan of completion Health Maintenance Due  Topic Date Due  . Hepatitis C Screening  Jan 13, 1951  . HIV Screening  03/09/1966  . ZOSTAVAX  03/10/2011  . PNA vac Low Risk Adult (1 of 2 - PCV13) 03/09/2016  . INFLUENZA VACCINE  05/15/2016   Medicare now request all "baby boomers" test for possible exposure to Hepatitis C. Many may have been exposed due to dental work, tatoo's, vaccinations when young. The Hepatitis C virus is dormant for many years and then sometimes will cause liver cancer. If you gave blood in the past 15 years, you were most likely checked for Hep C. If you rec'd blood; you may want to consider testing or if you are high risk for any other reason.   Has  had shingles x 2 Rh did not feel he should take this;  Somewhat immune system is compromised   Will talk to [facility] prior to taking the flu vaccine States he got the flu vaccine last year; was sick Then had flu shot another year and got pneumonia  Educated:  Keep in mind the flu shot is an inactivated vaccine and takes at least 2 weeks to build immunity. The flu virus can be dormant for 4 days prior to symptoms Taking the flu shot at the beginning of the season can reduce the risk for the entire community.  Will defer to [facility] per the patient's request   Pneumonia; educated regarding series and agrees to take the prevnar today   Cardiac  Risk Factors include: advanced age (>86men, >92 women);dyslipidemia;family history of premature cardiovascular disease;hypertension;male gender    Objective:    Today's Vitals   09/25/16 1001  BP: 116/60  Pulse: 73  SpO2: 98%  Weight: 161 lb (73 kg)  Height: 5\' 8"  (1.727 m)   Body mass index is 24.48 kg/m.  Current Medications (verified) Outpatient Encounter Prescriptions as of 09/25/2016  Medication Sig  . amLODipine (NORVASC) 5 MG tablet Take 1 tablet (5 mg total) by mouth daily.  Marland Kitchen aspirin EC 81 MG tablet Take 1 tablet (81 mg total) by mouth daily.  . brimonidine-timolol (COMBIGAN) 0.2-0.5 % ophthalmic solution Place 1 drop into the right eye every 12 (twelve) hours.   . Flaxseed, Linseed, (FLAX SEED OIL PO) Take 1 tablet by mouth daily.  . isosorbide mononitrate (IMDUR) 120 MG 24 hr tablet Take 1 tablet (120 mg total) by mouth daily.  . isosorbide mononitrate (IMDUR) 60 MG 24 hr tablet Take 1 tablet (60 mg total) by mouth daily.  . metoprolol tartrate (LOPRESSOR) 25 MG tablet Take 0.5 tablets (12.5 mg total) by mouth 2 (two) times daily.  . Multiple Vitamin (MULTIVITAMIN WITH MINERALS) TABS tablet Take 1 tablet by mouth daily.  Marland Kitchen NASONEX 50 MCG/ACT nasal spray Place 2 sprays into the nose daily.   . Omega-3 Fatty Acids (FISH OIL PO) Take 3 capsules by mouth daily.   Marland Kitchen omeprazole (PRILOSEC) 40 MG capsule TAKE ONE CAPSULE BY MOUTH DAILY  . Probiotic Product (VSL#3 PO) Take 1 capsule by mouth daily. Take for a month  . ramipril (ALTACE) 5 MG capsule TAKE ONE CAPSULE BY MOUTH DAILY  . RANEXA 500 MG 12 hr tablet TAKE 1 TABLET (500 MG TOTAL) BY MOUTH 2 (TWO) TIMES DAILY.  . rosuvastatin (CRESTOR) 5 MG tablet Take 1 tablet (5mg ) by mouth on Monday, Wednesday, Friday  . [DISCONTINUED] warfarin (COUMADIN) 4 MG tablet TAKE AS DIRECTED BY ANTICOAGULATION CLINIC   No facility-administered encounter medications on file as of 09/25/2016.     Allergies (verified) Sulfa antibiotics    History: Past Medical History:  Diagnosis Date  . AS (aortic stenosis)    Mild  . Barrett's esophagus 11/11/2014  . CAD S/P percutaneous coronary angioplasty    7 stents per the patient (no record).  Stent to 90% inferior branch of an OM and mild scattered plaque in other vessels [facility]  01/08/13.  Cath 2016 with patent stents. He did have 70% stenosis in a small LAD that was about 2 mm. Size  . Essential hypertension   . GERD (gastroesophageal reflux disease)   . Glaucoma   . Hypercoagulable state (HCC)   . Hyperlipidemia   . Joint pain   . Lupus   . Pneumonitis  Lupus  . Uses hearing aid    Past Surgical History:  Procedure Laterality Date  . CARDIAC CATHETERIZATION N/A 04/01/2015   Procedure: Left Heart Cath and Coronary Angiography;  Surgeon: Marykay Lex, MD;  Location: [facility] INVASIVE CV LAB;  Service: Cardiovascular;  Laterality: N/A;  . CATARACT EXTRACTION     x 2  . COLONOSCOPY    . KNEE SURGERY     bilateral  . NASAL SINUS SURGERY    . TONSILLECTOMY AND ADENOIDECTOMY    . UPPER GASTROINTESTINAL ENDOSCOPY     Family History  Problem Relation Age of Onset  . Diabetes Mother   . Hypertension Mother   . Arthritis Mother   . Diabetes Father   . Hypertension Father   . Arthritis Father   . CAD Father 47    died age 19 of MI   Social History   Occupational History  . Not on file.   Social History Main Topics  . Smoking status: Never Smoker  . Smokeless tobacco: Never Used  . Alcohol use 3.5 oz/week    7 Standard drinks or equivalent per week     Comment: does not drink as much; 6pack a  month  . Drug use: No  . Sexual activity: Not on file   Tobacco Counseling Counseling given: Yes   Activities of Daily Living In your present state of health, do you have any difficulty performing the following activities: 09/25/2016  Hearing? (No Data)  Vision? Y  Difficulty concentrating or making decisions? N  Walking or climbing stairs? Y  Dressing or  bathing? N  Doing errands, shopping? N  Preparing Food and eating ? N  Using the Toilet? N  In the past six months, have you accidently leaked urine? N  Do you have problems with loss of bowel control? N  Managing your Medications? N  Managing your Finances? N  Housekeeping or managing your Housekeeping? N  Some recent data might be hidden    Immunizations and Health Maintenance Immunization History  Administered Date(s) Administered  . Influenza Split 06/22/2013  . Influenza,inj,Quad PF,36+ Mos 08/02/2014, 07/28/2015  . Tdap 08/11/2014   Health Maintenance Due  Topic Date Due  . Hepatitis C Screening  22-Oct-1950  . HIV Screening  03/09/1966  . ZOSTAVAX  03/10/2011  . PNA vac Low Risk Adult (1 of 2 - PCV13) 03/09/2016  . INFLUENZA VACCINE  05/15/2016    Patient Care Team: Shirline Frees, NP as PCP - General (Family Medicine)  Indicate any recent Medical Services you may have received from other than [facility] providers in the past year (date may be approximate).    Assessment:   This is a routine wellness examination for [place].     Hearing/Vision screen  Hearing Screening   125Hz  250Hz  500Hz  1000Hz  2000Hz  3000Hz  4000Hz  6000Hz  8000Hz   Right ear:     100      Left ear:             Dietary issues and exercise activities discussed: Current Exercise Habits: Structured exercise class, Type of exercise: strength training/weights;walking, Time (Minutes): > 60, Frequency (Times/Week): 5, Weekly Exercise (Minutes/Week): 0, Intensity: Moderate  Goals    . patient          Continue to exercise and try to eat right       Depression Screen PHQ 2/9 Scores 09/25/2016  PHQ - 2 Score 0    Fall Risk Fall Risk  09/25/2016  Falls in the past year?  Yes  Number falls in past yr: 1  Follow up Education provided    Cognitive Function: AD8 score is 0      6CIT Screen 09/25/2016  What Year? 0 points  What month? 0 points  What time? 0 points  Count back from 20 0 points    Months in reverse 0 points  Repeat phrase 0 points  Total Score 0    Screening Tests Health Maintenance  Topic Date Due  . Hepatitis C Screening  03/15/1951  . HIV Screening  03/09/1966  . ZOSTAVAX  03/10/2011  . PNA vac Low Risk Adult (1 of 2 - PCV13) 03/09/2016  . INFLUENZA VACCINE  05/15/2016  . TETANUS/TDAP  08/11/2024  . COLONOSCOPY  11/03/2024        Plan:    During the course of the visit Onalee HuaDavid was educated and counseled about the following appropriate screening and preventive services:   Vaccines to include Pneumoccal, Influenza, Hepatitis B, Td, Zostavax, HCV  Electrocardiogram  Colorectal cancer screening  Cardiovascular disease screening  Diabetes screening  Glaucoma screening  Nutrition counseling  Prostate cancer screening  Smoking cessation counseling  Patient Instructions (the written plan) were given to the patient.   Montine CircleHauck,Namiyah Grantham, RN   09/25/2016

## 2016-09-25 ENCOUNTER — Ambulatory Visit (INDEPENDENT_AMBULATORY_CARE_PROVIDER_SITE_OTHER): Payer: PPO

## 2016-09-25 ENCOUNTER — Encounter (INDEPENDENT_AMBULATORY_CARE_PROVIDER_SITE_OTHER): Payer: PPO | Admitting: Adult Health

## 2016-09-25 ENCOUNTER — Encounter: Payer: Self-pay | Admitting: Adult Health

## 2016-09-25 ENCOUNTER — Other Ambulatory Visit: Payer: Self-pay | Admitting: General Practice

## 2016-09-25 VITALS — BP 116/60 | HR 73 | Ht 68.0 in | Wt 161.0 lb

## 2016-09-25 DIAGNOSIS — Z23 Encounter for immunization: Secondary | ICD-10-CM

## 2016-09-25 DIAGNOSIS — Z7289 Other problems related to lifestyle: Secondary | ICD-10-CM

## 2016-09-25 DIAGNOSIS — Z0000 Encounter for general adult medical examination without abnormal findings: Secondary | ICD-10-CM

## 2016-09-25 MED ORDER — WARFARIN SODIUM 4 MG PO TABS: ORAL_TABLET | ORAL | 1 refills | Status: DC

## 2016-09-25 NOTE — Patient Instructions (Addendum)
[name] , Thank you for taking time to come for your Medicare Wellness Visit. I appreciate your ongoing commitment to your health goals. Please review the following plan we discussed and let me know if I can assist you in the future.   Keep exercising;  May discuss repeat dexa with [name] due to long term prednisone at home time  Will take the prevnar today  Will defer the flu vaccine and will discuss with [name]  These are the goals we discussed: Goals    . patient          Continue to exercise and try to eat right        This is a list of the screening recommended for you and due dates:  Health Maintenance  Topic Date Due  .  Hepatitis C: One time screening is recommended by Center for Disease Control  (CDC) for  adults born from [birth year] through 1965.   Feb 10, 1951  . HIV Screening  03/09/1966  . Shingles Vaccine  03/10/2011  . Pneumonia vaccines (1 of 2 - PCV13) 03/09/2016  . Flu Shot  05/15/2016  . Tetanus Vaccine  08/11/2024  . Colon Cancer Screening  11/03/2024        Fall Prevention in the Home Introduction Falls can cause injuries. They can happen to people of all ages. There are many things you can do to make your home safe and to help prevent falls. What can I do on the outside of my home?  Regularly fix the edges of walkways and driveways and fix any cracks.  Remove anything that might make you trip as you walk through a door, such as a raised step or threshold.  Trim any bushes or trees on the path to your home.  Use bright outdoor lighting.  Clear any walking paths of anything that might make someone trip, such as rocks or tools.  Regularly check to see if handrails are loose or broken. Make sure that both sides of any steps have handrails.  Any raised decks and porches should have guardrails on the edges.  Have any leaves, snow, or ice cleared regularly.  Use sand or salt on walking paths during winter.  Clean up any spills in your garage right away.  This includes oil or grease spills. What can I do in the bathroom?  Use night lights.  Install grab bars by the toilet and in the tub and shower. Do not use towel bars as grab bars.  Use non-skid mats or decals in the tub or shower.  If you need to sit down in the shower, use a plastic, non-slip stool.  Keep the floor dry. Clean up any water that spills on the floor as soon as it happens.  Remove soap buildup in the tub or shower regularly.  Attach bath mats securely with double-sided non-slip rug tape.  Do not have throw rugs and other things on the floor that can make you trip. What can I do in the bedroom?  Use night lights.  Make sure that you have a light by your bed that is easy to reach.  Do not use any sheets or blankets that are too big for your bed. They should not hang down onto the floor.  Have a firm chair that has side arms. You can use this for support while you get dressed.  Do not have throw rugs and other things on the floor that can make you trip. What can I do in the  kitchen?  Clean up any spills right away.  Avoid walking on wet floors.  Keep items that you use a lot in easy-to-reach places.  If you need to reach something above you, use a strong step stool that has a grab bar.  Keep electrical cords out of the way.  Do not use floor polish or wax that makes floors slippery. If you must use wax, use non-skid floor wax.  Do not have throw rugs and other things on the floor that can make you trip. What can I do with my stairs?  Do not leave any items on the stairs.  Make sure that there are handrails on both sides of the stairs and use them. Fix handrails that are broken or loose. Make sure that handrails are as long as the stairways.  Check any carpeting to make sure that it is firmly attached to the stairs. Fix any carpet that is loose or worn.  Avoid having throw rugs at the top or bottom of the stairs. If you do have throw rugs, attach them  to the floor with carpet tape.  Make sure that you have a light switch at the top of the stairs and the bottom of the stairs. If you do not have them, ask someone to add them for you. What else can I do to help prevent falls?  Wear shoes that:  Do not have high heels.  Have rubber bottoms.  Are comfortable and fit you well.  Are closed at the toe. Do not wear sandals.  If you use a stepladder:  Make sure that it is fully opened. Do not climb a closed stepladder.  Make sure that both sides of the stepladder are locked into place.  Ask someone to hold it for you, if possible.  Clearly mark and make sure that you can see:  Any grab bars or handrails.  First and last steps.  Where the edge of each step is.  Use tools that help you move around (mobility aids) if they are needed. These include:  Canes.  Walkers.  Scooters.  Crutches.  Turn on the lights when you go into a dark area. Replace any light bulbs as soon as they burn out.  Set up your furniture so you have a clear path. Avoid moving your furniture around.  If any of your floors are uneven, fix them.  If there are any pets around you, be aware of where they are.  Review your medicines with your doctor. Some medicines can make you feel dizzy. This can increase your chance of falling. Ask your doctor what other things that you can do to help prevent falls. This information is not intended to replace advice given to you by your health care provider. Make sure you discuss any questions you have with your health care provider. Document Released: 07/28/2009 Document Revised: 03/08/2016 Document Reviewed: 11/05/2014  2017 Elsevier  Health Maintenance, Male A healthy lifestyle and preventative care can promote health and wellness.  Maintain regular health, dental, and eye exams.  Eat a healthy diet. Foods like vegetables, fruits, whole grains, low-fat dairy products, and lean protein foods contain the nutrients  you need and are low in calories. Decrease your intake of foods high in solid fats, added sugars, and salt. Get information about a proper diet from your health care provider, if necessary.  Regular physical exercise is one of the most important things you can do for your health. Most adults should get at least 150  minutes of moderate-intensity exercise (any activity that increases your heart rate and causes you to sweat) each week. In addition, most adults need muscle-strengthening exercises on 2 or more days a week.   Maintain a healthy weight. The body mass index (BMI) is a screening tool to identify possible weight problems. It provides an estimate of body fat based on height and weight. Your health care provider can find your BMI and can help you achieve or maintain a healthy weight. For males 20 years and older:  A BMI below 18.5 is considered underweight.  A BMI of 18.5 to 24.9 is normal.  A BMI of 25 to 29.9 is considered overweight.  A BMI of 30 and above is considered obese.  Maintain normal blood lipids and cholesterol by exercising and minimizing your intake of saturated fat. Eat a balanced diet with plenty of fruits and vegetables. Blood tests for lipids and cholesterol should begin at age 65 and be repeated every 5 years. If your lipid or cholesterol levels are high, you are over age 65, or you are at high risk for heart disease, you may need your cholesterol levels checked more frequently.Ongoing high lipid and cholesterol levels should be treated with medicines if diet and exercise are not working.  If you smoke, find out from your health care provider how to quit. If you do not use tobacco, do not start.  Lung cancer screening is recommended for adults aged 55-80 years who are at high risk for developing lung cancer because of a history of smoking. A yearly low-dose CT scan of the lungs is recommended for people who have at least a 30-pack-year history of smoking and are current  smokers or have quit within the past 15 years. A pack year of smoking is smoking an average of 1 pack of cigarettes a day for 1 year (for example, a 30-pack-year history of smoking could mean smoking 1 pack a day for 30 years or 2 packs a day for 15 years). Yearly screening should continue until the smoker has stopped smoking for at least 15 years. Yearly screening should be stopped for people who develop a health problem that would prevent them from having lung cancer treatment.  If you choose to drink alcohol, do not have more than 2 drinks per day. One drink is considered to be 12 oz (360 mL) of beer, 5 oz (150 mL) of wine, or 1.5 oz (45 mL) of liquor.  Avoid the use of street drugs. Do not share needles with anyone. Ask for help if you need support or instructions about stopping the use of drugs.  High blood pressure causes heart disease and increases the risk of stroke. High blood pressure is more likely to develop in:  People who have blood pressure in the end of the normal range (100-139/85-89 mm Hg).  People who are overweight or obese.  People who are African American.  If you are 4018-65 years of age, have your blood pressure checked every 3-5 years. If you are 65 years of age or older, have your blood pressure checked every year. You should have your blood pressure measured twice-once when you are at a hospital or clinic, and once when you are not at a hospital or clinic. Record the average of the two measurements. To check your blood pressure when you are not at a hospital or clinic, you can use:  An automated blood pressure machine at a pharmacy.  A home blood pressure monitor.  If you  are 38-20 years old, ask your health care provider if you should take aspirin to prevent heart disease.  Diabetes screening involves taking a blood sample to check your fasting blood sugar level. This should be done once every 3 years after age 38 if you are at a normal weight and without risk factors  for diabetes. Testing should be considered at a younger age or be carried out more frequently if you are overweight and have at least 1 risk factor for diabetes.  Colorectal cancer can be detected and often prevented. Most routine colorectal cancer screening begins at the age of 83 and continues through age 24. However, your health care provider may recommend screening at an earlier age if you have risk factors for colon cancer. On a yearly basis, your health care provider may provide home test kits to check for hidden blood in the stool. A small camera at the end of a tube may be used to directly examine the colon (sigmoidoscopy or colonoscopy) to detect the earliest forms of colorectal cancer. Talk to your health care provider about this at age 5 when routine screening begins. A direct exam of the colon should be repeated every 5-10 years through age 75, unless early forms of precancerous polyps or small growths are found.  People who are at an increased risk for hepatitis B should be screened for this virus. You are considered at high risk for hepatitis B if:  You were born in a country where hepatitis B occurs often. Talk with your health care provider about which countries are considered high risk.  Your parents were born in a high-risk country and you have not received a shot to protect against hepatitis B (hepatitis B vaccine).  You have HIV or AIDS.  You use needles to inject street drugs.  You live with, or have sex with, someone who has hepatitis B.  You are a man who has sex with other men (MSM).  You get hemodialysis treatment.  You take certain medicines for conditions like cancer, organ transplantation, and autoimmune conditions.  Hepatitis C blood testing is recommended for all people born from [birth year] through 1965 and any individual with known risk factors for hepatitis C.  Healthy men should no longer receive prostate-specific antigen (PSA) blood tests as part of routine cancer  screening. Talk to your health care provider about prostate cancer screening.  Testicular cancer screening is not recommended for adolescents or adult males who have no symptoms. Screening includes self-exam, a health care provider exam, and other screening tests. Consult with your health care provider about any symptoms you have or any concerns you have about testicular cancer.  Practice safe sex. Use condoms and avoid high-risk sexual practices to reduce the spread of sexually transmitted infections (STIs).  You should be screened for STIs, including gonorrhea and chlamydia if:  You are sexually active and are younger than 24 years.  You are older than 24 years, and your health care provider tells you that you are at risk for this type of infection.  Your sexual activity has changed since you were last screened, and you are at an increased risk for chlamydia or gonorrhea. Ask your health care provider if you are at risk.  If you are at risk of being infected with HIV, it is recommended that you take a prescription medicine daily to prevent HIV infection. This is called pre-exposure prophylaxis (PrEP). You are considered at risk if:  You are a man who has sex with  other men (MSM).  You are a heterosexual man who is sexually active with multiple partners.  You take drugs by injection.  You are sexually active with a partner who has HIV.  Talk with your health care provider about whether you are at high risk of being infected with HIV. If you choose to begin PrEP, you should first be tested for HIV. You should then be tested every 3 months for as long as you are taking PrEP.  Use sunscreen. Apply sunscreen liberally and repeatedly throughout the day. You should seek shade when your shadow is shorter than you. Protect yourself by wearing long sleeves, pants, a wide-brimmed hat, and sunglasses year round whenever you are outdoors.  Tell your health care provider of new moles or changes in  moles, especially if there is a change in shape or color. Also, tell your health care provider if a mole is larger than the size of a pencil eraser.  A one-time screening for abdominal aortic aneurysm (AAA) and surgical repair of large AAAs by ultrasound is recommended for men aged 65-75 years who are current or former smokers.  Stay current with your vaccines (immunizations). This information is not intended to replace advice given to you by your health care provider. Make sure you discuss any questions you have with your health care provider. Document Released: 03/29/2008 Document Revised: 10/22/2014 Document Reviewed: 07/05/2015 Elsevier Interactive Patient Education  2017 [employer].

## 2016-09-26 NOTE — Progress Notes (Signed)
Entered in error

## 2016-09-27 ENCOUNTER — Telehealth: Payer: Self-pay | Admitting: Adult Health

## 2016-09-27 ENCOUNTER — Other Ambulatory Visit: Payer: Self-pay | Admitting: General Practice

## 2016-09-27 ENCOUNTER — Other Ambulatory Visit: Payer: Self-pay | Admitting: Adult Health

## 2016-09-27 MED ORDER — WARFARIN SODIUM 4 MG PO TABS: ORAL_TABLET | ORAL | 0 refills | Status: DC

## 2016-09-27 NOTE — Telephone Encounter (Signed)
Pt is asking for refill. Please advise

## 2016-09-27 NOTE — Telephone Encounter (Signed)
Pharmacy called to follow up on Rx  warfarin (COUMADIN) 4 MG tablet  [facility] [address], not mail order. Requesting new rx to them please

## 2016-10-01 ENCOUNTER — Ambulatory Visit (INDEPENDENT_AMBULATORY_CARE_PROVIDER_SITE_OTHER): Payer: PPO | Admitting: General Practice

## 2016-10-01 DIAGNOSIS — Z5181 Encounter for therapeutic drug level monitoring: Secondary | ICD-10-CM

## 2016-10-01 DIAGNOSIS — D6862 Lupus anticoagulant syndrome: Secondary | ICD-10-CM

## 2016-10-01 LAB — POCT INR: INR: 2.4

## 2016-10-01 NOTE — Patient Instructions (Signed)
Pre visit review using our clinic review tool, if applicable. No additional management support is needed unless otherwise documented below in the visit note. 

## 2016-10-01 NOTE — Progress Notes (Signed)
I agree with this plan.

## 2016-10-17 ENCOUNTER — Other Ambulatory Visit: Payer: Self-pay

## 2016-10-17 MED ORDER — ISOSORBIDE MONONITRATE ER 120 MG PO TB24: 120.0000 mg | ORAL_TABLET | Freq: Every day | ORAL | 3 refills | Status: DC

## 2016-10-26 ENCOUNTER — Other Ambulatory Visit (INDEPENDENT_AMBULATORY_CARE_PROVIDER_SITE_OTHER): Payer: PPO

## 2016-10-26 DIAGNOSIS — Z0000 Encounter for general adult medical examination without abnormal findings: Secondary | ICD-10-CM | POA: Diagnosis not present

## 2016-10-26 DIAGNOSIS — Z125 Encounter for screening for malignant neoplasm of prostate: Secondary | ICD-10-CM | POA: Diagnosis not present

## 2016-10-26 DIAGNOSIS — Z5181 Encounter for therapeutic drug level monitoring: Secondary | ICD-10-CM

## 2016-10-26 DIAGNOSIS — D6862 Lupus anticoagulant syndrome: Secondary | ICD-10-CM

## 2016-10-26 LAB — CBC WITH DIFFERENTIAL/PLATELET
Basophils Absolute: 0 10*3/uL (ref 0.0–0.1)
Basophils Relative: 0.4 % (ref 0.0–3.0)
EOS PCT: 2.6 % (ref 0.0–5.0)
Eosinophils Absolute: 0.1 10*3/uL (ref 0.0–0.7)
HCT: 35.7 % — ABNORMAL LOW (ref 39.0–52.0)
HEMOGLOBIN: 11.6 g/dL — AB (ref 13.0–17.0)
Lymphocytes Relative: 36.8 % (ref 12.0–46.0)
Lymphs Abs: 1.9 10*3/uL (ref 0.7–4.0)
MCHC: 32.4 g/dL (ref 30.0–36.0)
MCV: 79.9 fl (ref 78.0–100.0)
MONO ABS: 0.6 10*3/uL (ref 0.1–1.0)
MONOS PCT: 11.3 % (ref 3.0–12.0)
Neutro Abs: 2.5 10*3/uL (ref 1.4–7.7)
Neutrophils Relative %: 48.9 % (ref 43.0–77.0)
Platelets: 185 10*3/uL (ref 150.0–400.0)
RBC: 4.47 Mil/uL (ref 4.22–5.81)
RDW: 17.5 % — ABNORMAL HIGH (ref 11.5–15.5)
WBC: 5.1 10*3/uL (ref 4.0–10.5)

## 2016-10-26 LAB — HEPATIC FUNCTION PANEL
ALK PHOS: 25 U/L — AB (ref 39–117)
ALT: 28 U/L (ref 0–53)
AST: 29 U/L (ref 0–37)
Albumin: 4 g/dL (ref 3.5–5.2)
BILIRUBIN TOTAL: 0.4 mg/dL (ref 0.2–1.2)
Bilirubin, Direct: 0.1 mg/dL (ref 0.0–0.3)
Total Protein: 7.3 g/dL (ref 6.0–8.3)

## 2016-10-26 LAB — POC URINALSYSI DIPSTICK (AUTOMATED)
BILIRUBIN UA: NEGATIVE
Glucose, UA: NEGATIVE
KETONES UA: NEGATIVE
Leukocytes, UA: NEGATIVE
NITRITE UA: NEGATIVE
PH UA: 7.5
Protein, UA: NEGATIVE
RBC UA: NEGATIVE
SPEC GRAV UA: 1.015
Urobilinogen, UA: 0.2

## 2016-10-26 LAB — BASIC METABOLIC PANEL
BUN: 15 mg/dL (ref 6–23)
CO2: 31 mEq/L (ref 19–32)
Calcium: 9.1 mg/dL (ref 8.4–10.5)
Chloride: 101 mEq/L (ref 96–112)
Creatinine, Ser: 0.76 mg/dL (ref 0.40–1.50)
GFR: 109.19 mL/min (ref >60.00)
Glucose, Bld: 92 mg/dL (ref 70–99)
POTASSIUM: 4.5 meq/L (ref 3.5–5.1)
SODIUM: 137 meq/L (ref 135–145)

## 2016-10-26 LAB — TSH: TSH: 3.05 u[IU]/mL (ref 0.35–4.50)

## 2016-10-26 LAB — LIPID PANEL
CHOL/HDL RATIO: 4
CHOLESTEROL: 109 mg/dL (ref 0–200)
HDL: 28.4 mg/dL — ABNORMAL LOW (ref >39.00)
LDL CALC: 57 mg/dL (ref 0–99)
NONHDL: 80.57
Triglycerides: 118 mg/dL (ref 0.0–149.0)
VLDL: 23.6 mg/dL (ref 0.0–40.0)

## 2016-10-26 LAB — PSA: PSA: 0.07 ng/mL — AB (ref 0.10–4.00)

## 2016-10-27 NOTE — Progress Notes (Signed)
I have reviewed and agree with this plan  

## 2016-11-01 ENCOUNTER — Encounter: Payer: PPO | Admitting: Adult Health

## 2016-11-02 DIAGNOSIS — H401111 Primary open-angle glaucoma, right eye, mild stage: Secondary | ICD-10-CM | POA: Diagnosis not present

## 2016-11-07 ENCOUNTER — Ambulatory Visit (INDEPENDENT_AMBULATORY_CARE_PROVIDER_SITE_OTHER): Payer: PPO | Admitting: Adult Health

## 2016-11-07 ENCOUNTER — Encounter: Payer: Self-pay | Admitting: Adult Health

## 2016-11-07 VITALS — BP 124/76 | Temp 98.0°F | Ht 68.0 in | Wt 164.8 lb

## 2016-11-07 DIAGNOSIS — Z0000 Encounter for general adult medical examination without abnormal findings: Secondary | ICD-10-CM

## 2016-11-07 DIAGNOSIS — Z23 Encounter for immunization: Secondary | ICD-10-CM

## 2016-11-07 DIAGNOSIS — E7800 Pure hypercholesterolemia, unspecified: Secondary | ICD-10-CM | POA: Diagnosis not present

## 2016-11-07 DIAGNOSIS — I10 Essential (primary) hypertension: Secondary | ICD-10-CM

## 2016-11-07 NOTE — Addendum Note (Signed)
Addended by: Janelle FloorHOMPSON, MONICA B on: 11/07/2016 12:08 PM   Modules accepted: Orders

## 2016-11-07 NOTE — Progress Notes (Signed)
Subjective:    Patient ID: [name], male    DOB: [birth year], 66 y.o.   MRN: [id number]  HPI  Patient presents for yearly preventative medicine examination. He is a pleasant 66 year old male who  has a past medical history of AS (aortic stenosis); Barrett's esophagus (11/11/2014); CAD S/P percutaneous coronary angioplasty; Essential hypertension; GERD (gastroesophageal reflux disease); Glaucoma; Hypercoagulable state (HCC); Hyperlipidemia; Joint pain; Lupus; Pneumonitis; and Uses hearing aid.   All immunizations and health maintenance protocols were reviewed with the patient and needed orders were placed. He will have his flu shot today  Appropriate screening laboratory values were ordered for the patient including screening of hyperlipidemia, renal function and hepatic function.  Medication reconciliation,  past medical history, social history, problem list and allergies were reviewed in detail with the patient  Goals were established with regard to weight loss, exercise, and  diet in compliance with medications. He eats healthy and exercises on a daily basis.   He is up to date on his vision screen and reports that his progress of glaucoma has not become worse. He is up to date on his colonoscopy and dental visits.   He has been seen by Cardiology in the fall and had his isosorbide increased which he found to be helpful.   He has no acute complaints today   Review of Systems  Constitutional: Negative.   HENT: Negative.   Eyes: Negative.   Respiratory: Negative.   Cardiovascular: Negative.   Gastrointestinal: Negative.   Endocrine: Negative.   Genitourinary: Negative.   Musculoskeletal: Negative.   Skin: Negative.   Allergic/Immunologic: Negative.   Neurological: Negative.   Hematological: Negative.   Psychiatric/Behavioral: Negative.   All other systems reviewed and are negative.  Past Medical History:  Diagnosis Date  . AS (aortic stenosis)    Mild  . Barrett's  esophagus 11/11/2014  . CAD S/P percutaneous coronary angioplasty    7 stents per the patient (no record).  Stent to 90% inferior branch of an OM and mild scattered plaque in other vessels [facility]  01/08/13.  Cath 2016 with patent stents. He did have 70% stenosis in a small LAD that was about 2 mm. Size  . Essential hypertension   . GERD (gastroesophageal reflux disease)   . Glaucoma   . Hypercoagulable state (HCC)   . Hyperlipidemia   . Joint pain   . Lupus   . Pneumonitis    Lupus  . Uses hearing aid     Social History   Social History  . Marital status: Married    Spouse name: N/A  . Number of children: 2  . Years of education: N/A   Occupational History  . Not on file.   Social History Main Topics  . Smoking status: Never Smoker  . Smokeless tobacco: Never Used  . Alcohol use 3.5 oz/week    7 Standard drinks or equivalent per week     Comment: does not drink as much; 6pack a  month  . Drug use: No  . Sexual activity: Not on file   Other Topics Concern  . Not on file   Social History Narrative   Married for 42 years   Has two grown children and three grandchildren.    Retired on disability   No longer have pets. Moved from a home to an apartment   Likes to read,workout, travel.     Past Surgical History:  Procedure Laterality Date  . CARDIAC CATHETERIZATION N/A  04/01/2015   Procedure: Left Heart Cath and Coronary Angiography;  Surgeon: Marykay Lex, MD;  Location: [facility] INVASIVE CV LAB;  Service: Cardiovascular;  Laterality: N/A;  . CATARACT EXTRACTION     x 2  . COLONOSCOPY    . KNEE SURGERY     bilateral  . NASAL SINUS SURGERY    . TONSILLECTOMY AND ADENOIDECTOMY    . UPPER GASTROINTESTINAL ENDOSCOPY      Family History  Problem Relation Age of Onset  . Diabetes Mother   . Hypertension Mother   . Arthritis Mother   . Diabetes Father   . Hypertension Father   . Arthritis Father   . CAD Father 24    died age 35 of MI    Allergies    Allergen Reactions  . Sulfa Antibiotics Other (See Comments)    Makes patient jittery, prefers not to take them.     Current Outpatient Prescriptions on File Prior to Visit  Medication Sig Dispense Refill  . amLODipine (NORVASC) 5 MG tablet Take 1 tablet (5 mg total) by mouth daily. 30 tablet 8  . aspirin EC 81 MG tablet Take 1 tablet (81 mg total) by mouth daily. 90 tablet 3  . brimonidine-timolol (COMBIGAN) 0.2-0.5 % ophthalmic solution Place 1 drop into the right eye every 12 (twelve) hours.     . Flaxseed, Linseed, (FLAX SEED OIL PO) Take 1 tablet by mouth daily.    . isosorbide mononitrate (IMDUR) 120 MG 24 hr tablet Take 1 tablet (120 mg total) by mouth daily. 90 tablet 3  . metoprolol tartrate (LOPRESSOR) 25 MG tablet Take 0.5 tablets (12.5 mg total) by mouth 2 (two) times daily. 60 tablet 6  . Multiple Vitamin (MULTIVITAMIN WITH MINERALS) TABS tablet Take 1 tablet by mouth daily.    Marland Kitchen NASONEX 50 MCG/ACT nasal spray Place 2 sprays into the nose daily.     . Omega-3 Fatty Acids (FISH OIL PO) Take 3 capsules by mouth daily.     Marland Kitchen omeprazole (PRILOSEC) 40 MG capsule TAKE ONE CAPSULE BY MOUTH DAILY 90 capsule 2  . Probiotic Product (VSL#3 PO) Take 1 capsule by mouth daily. Take for a month    . ramipril (ALTACE) 5 MG capsule TAKE ONE CAPSULE BY MOUTH DAILY 90 capsule 3  . RANEXA 500 MG 12 hr tablet TAKE 1 TABLET (500 MG TOTAL) BY MOUTH 2 (TWO) TIMES DAILY. 180 tablet 3  . rosuvastatin (CRESTOR) 5 MG tablet Take 1 tablet (5mg ) by mouth on Monday, Wednesday, Friday 15 tablet 11  . warfarin (COUMADIN) 4 MG tablet TAKE AS DIRECTED BY ANTICOAGULATION CLINIC 90 tablet 0  . isosorbide mononitrate (IMDUR) 60 MG 24 hr tablet Take 1 tablet (60 mg total) by mouth daily. 90 tablet 0   No current facility-administered medications on file prior to visit.     BP 124/76   Temp 98 F (36.7 C) (Oral)   Ht 5\' 8"  (1.727 m)   Wt 164 lb 12.8 oz (74.8 kg)   BMI 25.06 kg/m       Objective:    Physical Exam  Constitutional: He is oriented to person, place, and time. He appears well-developed and well-nourished. No distress.  HENT:  Head: Normocephalic and atraumatic.  Right Ear: External ear normal.  Left Ear: External ear normal.  Nose: Nose normal.  Mouth/Throat: Oropharynx is clear and moist. No oropharyngeal exudate.  Eyes: Conjunctivae and EOM are normal. Pupils are equal, round, and reactive to light. Right eye exhibits  no discharge. Left eye exhibits no discharge. No scleral icterus.  Neck: Trachea normal and normal range of motion. Neck supple. No JVD present. Carotid bruit is not present. No tracheal deviation present. No thyroid mass and no thyromegaly present.  Cardiovascular: Normal rate, regular rhythm and intact distal pulses.  Exam reveals no gallop and no friction rub.   Murmur heard. Pulmonary/Chest: Effort normal and breath sounds normal. No stridor. No respiratory distress. He has no wheezes. He has no rales. He exhibits no tenderness.  Abdominal: Soft. Bowel sounds are normal. He exhibits no distension and no mass. There is no tenderness. There is no rebound and no guarding.  Genitourinary: Rectum normal and prostate normal. Rectal exam shows guaiac negative stool.  Musculoskeletal: Normal range of motion.  Lymphadenopathy:    He has no cervical adenopathy.  Neurological: He is alert and oriented to person, place, and time. No cranial nerve deficit. Coordination normal.  Skin: Skin is warm and dry. No rash noted. He is not diaphoretic. No erythema. No pallor.  Psychiatric: He has a normal mood and affect. His behavior is normal. Judgment and thought content normal.  Nursing note and vitals reviewed.     Assessment & Plan:  1. Routine general medical examination at a health care facility - Reviewed labs in detail with patient. All questions answered.  - Continue to eat healthy and exercise - Follow up in one year or sooner if needed  2. Essential  hypertension - Controlled.  - Follow up with cardiology as directed  3. Pure hypercholesterolemia -  Lab Results  Component Value Date   CHOL 109 10/26/2016   HDL 28.40 (L) 10/26/2016   LDLCALC 57 10/26/2016   TRIG 118.0 10/26/2016   CHOLHDL 4 10/26/2016   - Well controlled with Crestor 5 mg  - No change in medication at this time.   Shirline Freesory Deetra Booton, NP

## 2016-11-12 ENCOUNTER — Ambulatory Visit (INDEPENDENT_AMBULATORY_CARE_PROVIDER_SITE_OTHER): Payer: PPO | Admitting: General Practice

## 2016-11-12 DIAGNOSIS — D6862 Lupus anticoagulant syndrome: Secondary | ICD-10-CM

## 2016-11-12 DIAGNOSIS — Z5181 Encounter for therapeutic drug level monitoring: Secondary | ICD-10-CM

## 2016-11-12 LAB — POCT INR: INR: 1.8

## 2016-12-01 ENCOUNTER — Other Ambulatory Visit: Payer: Self-pay | Admitting: Cardiology

## 2016-12-20 ENCOUNTER — Other Ambulatory Visit: Payer: Self-pay | Admitting: Cardiology

## 2016-12-20 ENCOUNTER — Other Ambulatory Visit: Payer: Self-pay

## 2016-12-20 MED ORDER — ROSUVASTATIN CALCIUM 5 MG PO TABS: ORAL_TABLET | ORAL | 5 refills | Status: DC

## 2016-12-20 NOTE — Telephone Encounter (Signed)
Rx(s) sent to pharmacy electronically.  

## 2016-12-24 ENCOUNTER — Ambulatory Visit (INDEPENDENT_AMBULATORY_CARE_PROVIDER_SITE_OTHER): Payer: PPO | Admitting: General Practice

## 2016-12-24 ENCOUNTER — Other Ambulatory Visit: Payer: Self-pay | Admitting: Adult Health

## 2016-12-24 DIAGNOSIS — Z5181 Encounter for therapeutic drug level monitoring: Secondary | ICD-10-CM

## 2016-12-24 DIAGNOSIS — D6862 Lupus anticoagulant syndrome: Secondary | ICD-10-CM

## 2016-12-24 LAB — POCT INR: INR: 2.3

## 2016-12-24 NOTE — Patient Instructions (Signed)
Pre visit review using our clinic review tool, if applicable. No additional management support is needed unless otherwise documented below in the visit note. 

## 2016-12-25 NOTE — Progress Notes (Signed)
I agree with this plan.

## 2016-12-25 NOTE — Telephone Encounter (Signed)
Ok to refill for 90 days  

## 2016-12-25 NOTE — Progress Notes (Signed)
HPI The patient presents for follow up of a history of coronary disease.  He has an extensive history of coronary disease and reports 7 stents in the past. (I don't have these records.)  He had a cath in May 2016 which demonstrated patent stents in the LAD, circ and RCA with 70% stenosis in a small LAD.  He has had recurrent dyspnea with increasing medical management.  Since that time he is doing relatively well. He still does get some occasional sensation at night he considers to be angina but it's much improved. He's actually building wall lifting heavy blocks. With this he is not having any new shortness of breath, PND or orthopnea he's not having any palpitations, presyncope or syncope. He's doing better than he was previously.   Allergies  Allergen Reactions  . Sulfa Antibiotics Other (See Comments)    Makes patient jittery, prefers not to take them.     Current Outpatient Prescriptions  Medication Sig Dispense Refill  . amLODipine (NORVASC) 5 MG tablet TAKE ONE TABLET BY MOUTH DAILY 30 tablet 7  . aspirin EC 81 MG tablet Take 1 tablet (81 mg total) by mouth daily. 90 tablet 3  . brimonidine-timolol (COMBIGAN) 0.2-0.5 % ophthalmic solution Place 1 drop into the right eye every 12 (twelve) hours.     . Flaxseed, Linseed, (FLAX SEED OIL PO) Take 1 tablet by mouth daily.    . isosorbide mononitrate (IMDUR) 120 MG 24 hr tablet Take 1 tablet (120 mg total) by mouth daily. 90 tablet 3  . metoprolol tartrate (LOPRESSOR) 25 MG tablet Take 0.5 tablets (12.5 mg total) by mouth 2 (two) times daily. 60 tablet 6  . Multiple Vitamin (MULTIVITAMIN WITH MINERALS) TABS tablet Take 1 tablet by mouth daily.    Marland Kitchen NASONEX 50 MCG/ACT nasal spray Place 2 sprays into the nose daily.     . Omega-3 Fatty Acids (FISH OIL PO) Take 3 capsules by mouth daily.     Marland Kitchen omeprazole (PRILOSEC) 40 MG capsule TAKE ONE CAPSULE BY MOUTH DAILY 90 capsule 2  . Probiotic Product (VSL#3 PO) Take 1 capsule by mouth daily. Take for  a month    . ramipril (ALTACE) 5 MG capsule TAKE ONE CAPSULE BY MOUTH DAILY 90 capsule 3  . RANEXA 500 MG 12 hr tablet TAKE 1 TABLET (500 MG TOTAL) BY MOUTH 2 (TWO) TIMES DAILY. 180 tablet 3  . rosuvastatin (CRESTOR) 5 MG tablet TAKE 1 TABLET BY MOUTH on Monday, Wednesday AND Friday 15 tablet 5  . warfarin (COUMADIN) 4 MG tablet TAKE AS DIRECTED BY ANTICOAGULATION CLINIC 90 tablet 0  . isosorbide mononitrate (IMDUR) 60 MG 24 hr tablet Take 1 tablet (60 mg total) by mouth daily. 90 tablet 0   No current facility-administered medications for this visit.     Past Medical History:  Diagnosis Date  . AS (aortic stenosis)    Mild  . Barrett's esophagus 11/11/2014  . CAD S/P percutaneous coronary angioplasty    7 stents per the patient (no record).  Stent to 90% inferior branch of an OM and mild scattered plaque in other vessels [facility]  01/08/13.  Cath 2016 with patent stents. He did have 70% stenosis in a small LAD that was about 2 mm. Size  . Essential hypertension   . GERD (gastroesophageal reflux disease)   . Glaucoma   . Hypercoagulable state (HCC)   . Hyperlipidemia   . Joint pain   . Lupus   . Pneumonitis  Lupus  . Uses hearing aid     Past Surgical History:  Procedure Laterality Date  . CARDIAC CATHETERIZATION N/A 04/01/2015   Procedure: Left Heart Cath and Coronary Angiography;  Surgeon: Marykay Lexavid W Harding, MD;  [facility];  Service: Cardiovascular;  Laterality: N/A;  . CATARACT EXTRACTION     x 2  . COLONOSCOPY    . KNEE SURGERY     bilateral  . NASAL SINUS SURGERY    . TONSILLECTOMY AND ADENOIDECTOMY    . UPPER GASTROINTESTINAL ENDOSCOPY      ROS:     As stated in the HPI and negative for all other systems.  PHYSICAL EXAM BP 132/76 (BP Location: Left Arm, Patient Position: Sitting, Cuff Size: Normal)   Pulse 63   Ht 5\' 8"  (1.727 m)   Wt 168 lb (76.2 kg)   BMI 25.54 kg/m  GENERAL:  Well appearing NECK:  No jugular venous distention,  waveform within normal limits, carotid upstroke brisk and symmetric, possible left bruit, no thyromegaly LUNGS:  Clear to auscultation bilaterally BACK:  No CVA tenderness CHEST:  Unremarkable HEART:  PMI not displaced or sustained,S1 and S2 within normal limits, no S3, no S4, no clicks, no rubs, apical systolic murmur at the apex and slightly radiating, no diasotlic murmurs ABD:  Flat, positive bowel sounds normal in frequency in pitch, no bruits, no rebound, no guarding, no midline pulsatile mass, no hepatomegaly, no splenomegaly EXT:  2 plus pulses left radial, decreased right radial, no edema, no cyanosis no clubbing.  Lab Results  Component Value Date   CHOL 109 10/26/2016   TRIG 118.0 10/26/2016   HDL 28.40 (L) 10/26/2016   LDLCALC 57 10/26/2016   EKG:  NA  ASSESSMENT AND PLAN  CAD:     He has disease as described.  The patient has no new sypmtoms.  No further cardiovascular testing is indicated.  We will continue with aggressive risk reduction and meds as listed.    HTN:    This is being treated in the context of managing his coronary disease.  DYSLIPIDEMIA:  LDL was 57 as above.  He will remain on meds as listed.   BRUIT:  He had only mild plaque.  No further work up is indicated at this time. Reviewed with the patient today.   AS:  This was moderate on echo 2017.   I will repeat an echo in Oct to follow this.    HYPERCOAGUABLE:  Because of his extensive disease he is on both aspirin and warfarin and we have discussed this.

## 2016-12-26 ENCOUNTER — Encounter: Payer: Self-pay | Admitting: Cardiology

## 2016-12-26 ENCOUNTER — Ambulatory Visit (INDEPENDENT_AMBULATORY_CARE_PROVIDER_SITE_OTHER): Payer: PPO | Admitting: Cardiology

## 2016-12-26 VITALS — BP 132/76 | HR 63 | Ht 68.0 in | Wt 168.0 lb

## 2016-12-26 DIAGNOSIS — I25118 Atherosclerotic heart disease of native coronary artery with other forms of angina pectoris: Secondary | ICD-10-CM | POA: Diagnosis not present

## 2016-12-26 DIAGNOSIS — E785 Hyperlipidemia, unspecified: Secondary | ICD-10-CM | POA: Diagnosis not present

## 2016-12-26 DIAGNOSIS — I6523 Occlusion and stenosis of bilateral carotid arteries: Secondary | ICD-10-CM

## 2016-12-26 DIAGNOSIS — D6859 Other primary thrombophilia: Secondary | ICD-10-CM

## 2016-12-26 DIAGNOSIS — I350 Nonrheumatic aortic (valve) stenosis: Secondary | ICD-10-CM

## 2016-12-26 NOTE — Patient Instructions (Signed)
Medication Instructions:  Continue current medications  Labwork: None Ordered  Testing/Procedures: Your physician has requested that you have an echocardiogram in October. Echocardiography is a painless test that uses sound waves to create images of your heart. It provides your doctor with information about the size and shape of your heart and how well your heart's chambers and valves are working. This procedure takes approximately one hour. There are no restrictions for this procedure.  Follow-Up: Your physician wants you to follow-up in: 6 Months. You will receive a reminder letter in the mail two months in advance. If you don't receive a letter, please call our office to schedule the follow-up appointment.   Any Other Special Instructions Will Be Listed Below (If Applicable).   If you need a refill on your cardiac medications before your next appointment, please call your pharmacy.

## 2017-02-04 ENCOUNTER — Ambulatory Visit: Payer: PPO

## 2017-02-12 DIAGNOSIS — H401111 Primary open-angle glaucoma, right eye, mild stage: Secondary | ICD-10-CM | POA: Diagnosis not present

## 2017-03-24 ENCOUNTER — Other Ambulatory Visit: Payer: Self-pay | Admitting: Adult Health

## 2017-03-24 IMAGING — CR DG CHEST 2V
2 series · 2 of 2 positions shown · non-contrast
Comparison: August 02, 2014

CLINICAL DATA: Shortness of breath with exertion. Pre cardiac
catheterization

EXAM:
CHEST  2 VIEW

[w chest pa]
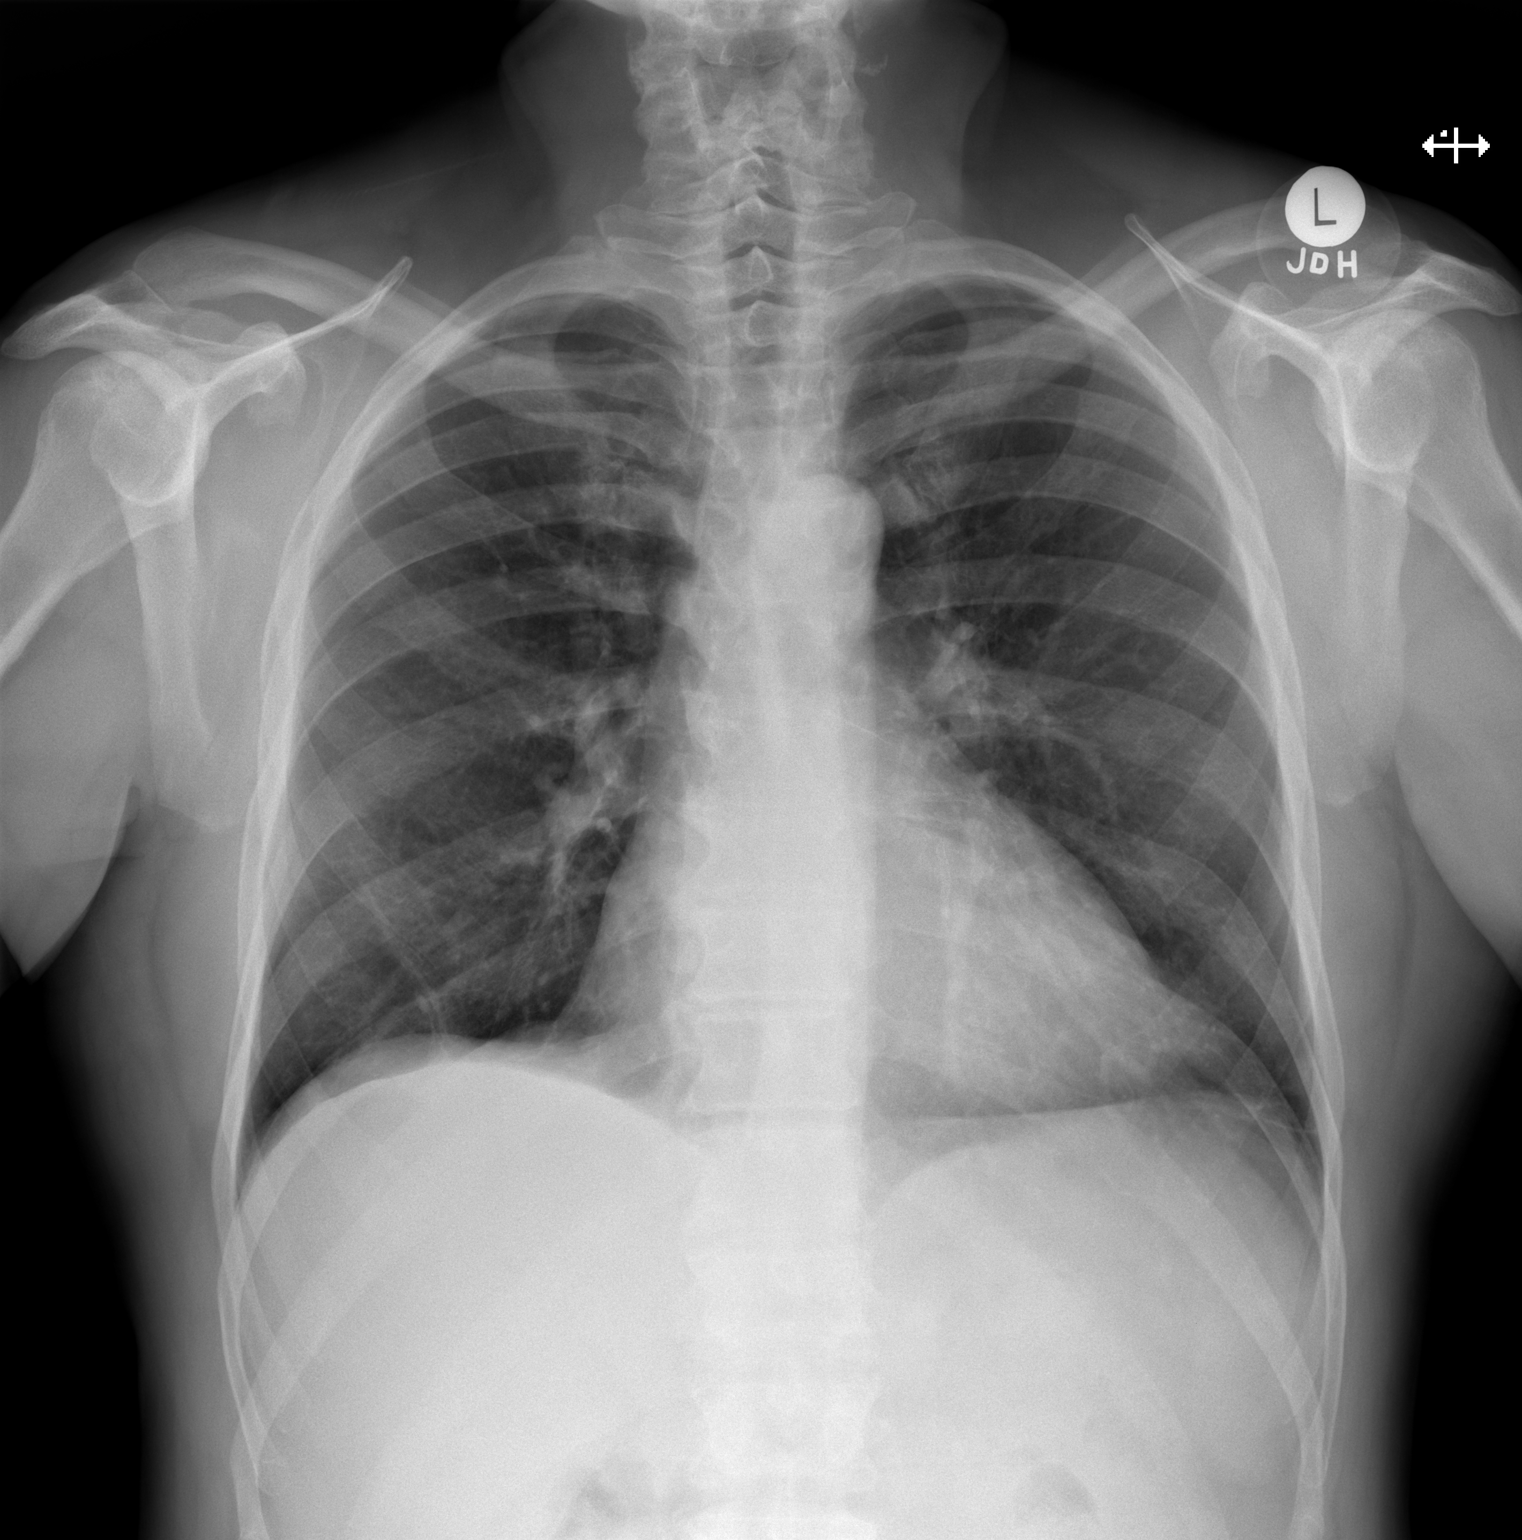

[w chest lat]
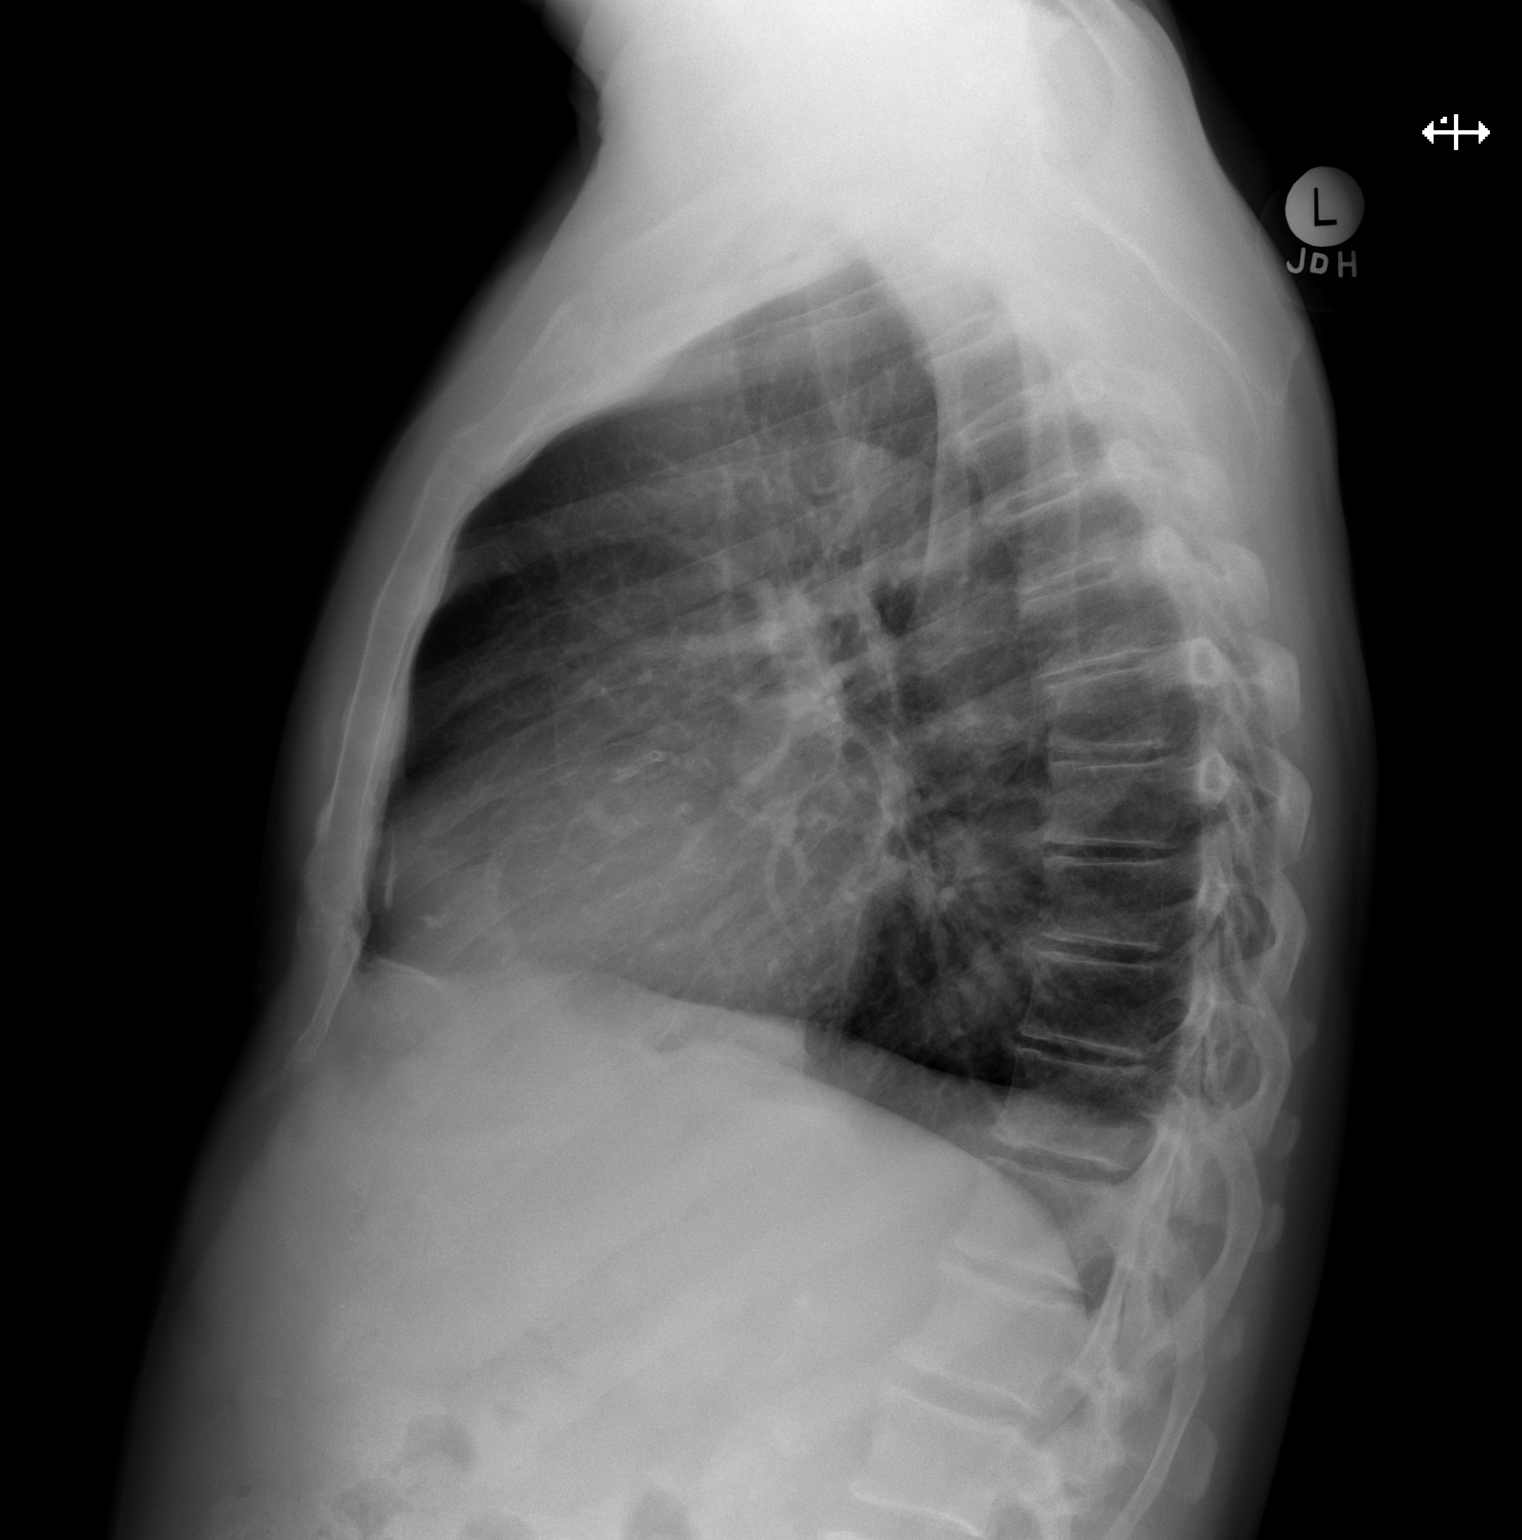

[2 of 2 positions shown; findings below may reference images not displayed]

FINDINGS: There is mild scarring in the bases. There is no edema or
consolidation. Heart size and pulmonary vascularity are normal. No
adenopathy. There is extensive coronary artery calcification.
IMPRESSION: No edema or consolidation. Areas of bibasilar scarring. Multiple
foci of coronary artery calcification.

## 2017-03-25 ENCOUNTER — Other Ambulatory Visit: Payer: Self-pay | Admitting: General Practice

## 2017-03-25 ENCOUNTER — Ambulatory Visit (INDEPENDENT_AMBULATORY_CARE_PROVIDER_SITE_OTHER): Payer: PPO | Admitting: General Practice

## 2017-03-25 DIAGNOSIS — Z5181 Encounter for therapeutic drug level monitoring: Secondary | ICD-10-CM

## 2017-03-25 DIAGNOSIS — D6862 Lupus anticoagulant syndrome: Secondary | ICD-10-CM

## 2017-03-25 LAB — POCT INR: INR: 3.1

## 2017-03-25 MED ORDER — WARFARIN SODIUM 4 MG PO TABS: ORAL_TABLET | ORAL | 0 refills | Status: DC

## 2017-03-25 NOTE — Patient Instructions (Signed)
Pre visit review using our clinic review tool, if applicable. No additional management support is needed unless otherwise documented below in the visit note. 

## 2017-03-25 NOTE — Progress Notes (Signed)
I agree with this plan.

## 2017-03-27 ENCOUNTER — Ambulatory Visit: Payer: PPO

## 2017-04-22 ENCOUNTER — Ambulatory Visit (INDEPENDENT_AMBULATORY_CARE_PROVIDER_SITE_OTHER): Payer: PPO | Admitting: General Practice

## 2017-04-22 DIAGNOSIS — Z5181 Encounter for therapeutic drug level monitoring: Secondary | ICD-10-CM | POA: Diagnosis not present

## 2017-04-22 DIAGNOSIS — D6862 Lupus anticoagulant syndrome: Secondary | ICD-10-CM

## 2017-04-22 LAB — POCT INR: INR: 2

## 2017-04-22 NOTE — Progress Notes (Signed)
I agree with this plan.

## 2017-04-22 NOTE — Patient Instructions (Signed)
Pre visit review using our clinic review tool, if applicable. No additional management support is needed unless otherwise documented below in the visit note. 

## 2017-04-25 ENCOUNTER — Other Ambulatory Visit: Payer: Self-pay | Admitting: Adult Health

## 2017-05-20 ENCOUNTER — Ambulatory Visit (INDEPENDENT_AMBULATORY_CARE_PROVIDER_SITE_OTHER): Payer: PPO | Admitting: General Practice

## 2017-05-20 DIAGNOSIS — Z5181 Encounter for therapeutic drug level monitoring: Secondary | ICD-10-CM | POA: Diagnosis not present

## 2017-05-20 DIAGNOSIS — D6862 Lupus anticoagulant syndrome: Secondary | ICD-10-CM

## 2017-05-20 LAB — POCT INR: INR: 2.5

## 2017-05-20 NOTE — Patient Instructions (Signed)
Pre visit review using our clinic review tool, if applicable. No additional management support is needed unless otherwise documented below in the visit note. 

## 2017-05-20 NOTE — Progress Notes (Signed)
I agree with this plan.

## 2017-06-04 ENCOUNTER — Other Ambulatory Visit: Payer: Self-pay | Admitting: Cardiology

## 2017-06-07 ENCOUNTER — Encounter: Payer: Self-pay | Admitting: Cardiology

## 2017-06-17 ENCOUNTER — Other Ambulatory Visit: Payer: Self-pay | Admitting: Adult Health

## 2017-06-18 ENCOUNTER — Other Ambulatory Visit: Payer: Self-pay | Admitting: General Practice

## 2017-06-18 MED ORDER — WARFARIN SODIUM 4 MG PO TABS: ORAL_TABLET | ORAL | 1 refills | Status: DC

## 2017-06-20 DIAGNOSIS — H5213 Myopia, bilateral: Secondary | ICD-10-CM | POA: Diagnosis not present

## 2017-06-20 DIAGNOSIS — H401111 Primary open-angle glaucoma, right eye, mild stage: Secondary | ICD-10-CM | POA: Diagnosis not present

## 2017-06-23 ENCOUNTER — Other Ambulatory Visit: Payer: Self-pay | Admitting: Adult Health

## 2017-06-24 ENCOUNTER — Ambulatory Visit (INDEPENDENT_AMBULATORY_CARE_PROVIDER_SITE_OTHER): Payer: PPO | Admitting: General Practice

## 2017-06-24 DIAGNOSIS — Z5181 Encounter for therapeutic drug level monitoring: Secondary | ICD-10-CM

## 2017-06-24 DIAGNOSIS — Z7901 Long term (current) use of anticoagulants: Secondary | ICD-10-CM

## 2017-06-24 DIAGNOSIS — D6862 Lupus anticoagulant syndrome: Secondary | ICD-10-CM

## 2017-06-24 LAB — POCT INR: INR: 2.2

## 2017-06-26 DIAGNOSIS — Z7901 Long term (current) use of anticoagulants: Secondary | ICD-10-CM | POA: Insufficient documentation

## 2017-06-26 NOTE — Progress Notes (Signed)
HPI The patient presents for follow up of a history of coronary disease.  He has an extensive history of coronary disease and reports 7 stents in the past. (I don't have these records.)  He had a cath in May 2016 which demonstrated patent stents in the LAD, circ and RCA with 70% stenosis in a small LAD.  He says he exercises fairly vigorously every morning. He does fairly well with this. However, evenings he's fatigued. He says his heart rate is pounding a little bit. He's not had any reason to take nitroglycerin. Denies any resting chest discomfort, neck or arm discomfort. He's not having any resting palpitations, presyncope or syncope. There's been no PND or orthopnea. He doesn't have the symptoms that he had prior to his previous stents.  Allergies  Allergen Reactions  . Sulfa Antibiotics Other (See Comments)    Makes patient jittery, prefers not to take them.     Current Outpatient Prescriptions  Medication Sig Dispense Refill  . amLODipine (NORVASC) 5 MG tablet TAKE ONE TABLET BY MOUTH DAILY 30 tablet 7  . aspirin EC 81 MG tablet Take 1 tablet (81 mg total) by mouth daily. 90 tablet 3  . Azelastine HCl 0.15 % SOLN USE 2 SPRAYS BY NASAL ROUTE 2 TIMES A DAY    . brimonidine-timolol (COMBIGAN) 0.2-0.5 % ophthalmic solution Place 1 drop into the right eye every 12 (twelve) hours.     . Flaxseed, Linseed, (FLAX SEED OIL PO) Take 1 tablet by mouth daily.    . isosorbide mononitrate (IMDUR) 120 MG 24 hr tablet Take 1 tablet (120 mg total) by mouth daily. 90 tablet 3  . isosorbide mononitrate (IMDUR) 60 MG 24 hr tablet Take 1 tablet (60 mg total) by mouth daily. 90 tablet 0  . metoprolol tartrate (LOPRESSOR) 25 MG tablet Take 0.5 tablets (12.5 mg total) by mouth 2 (two) times daily. 60 tablet 6  . Multiple Vitamin (MULTIVITAMIN WITH MINERALS) TABS tablet Take 1 tablet by mouth daily.    Marland Kitchen NASONEX 50 MCG/ACT nasal spray Place 2 sprays into the nose daily.     . Omega-3 Fatty Acids (FISH OIL PO)  Take 3 capsules by mouth daily.     Marland Kitchen omeprazole (PRILOSEC) 40 MG capsule TAKE ONE CAPSULE BY MOUTH DAILY 90 capsule 1  . Probiotic Product (VSL#3 PO) Take 1 capsule by mouth daily. Take for a month    . ramipril (ALTACE) 5 MG capsule TAKE ONE CAPSULE BY MOUTH DAILY 90 capsule 2  . RANEXA 500 MG 12 hr tablet TAKE 1 TABLET (500 MG TOTAL) BY MOUTH 2 (TWO) TIMES DAILY. 180 tablet 3  . rosuvastatin (CRESTOR) 5 MG tablet TAKE 1 TABLET BY MOUTH ON MONDAY, WEDNESDAY, AND FRIDAY 36 tablet 0  . warfarin (COUMADIN) 4 MG tablet TAKE AS DIRECTED BY ANTICOAGULATION CLINIC 90 tablet 1   No current facility-administered medications for this visit.     Past Medical History:  Diagnosis Date  . AS (aortic stenosis)    Mild  . Barrett's esophagus 11/11/2014  . CAD S/P percutaneous coronary angioplasty    7 stents per the patient (no record).  Stent to 90% inferior branch of an OM and mild scattered plaque in other vessels [facility]  01/08/13.  Cath 2016 with patent stents. He did have 70% stenosis in a small LAD that was about 2 mm. Size  . Essential hypertension   . GERD (gastroesophageal reflux disease)   . Glaucoma   . Hypercoagulable state (  HCC)   . Hyperlipidemia   . Joint pain   . Lupus   . Pneumonitis    Lupus  . Uses hearing aid     Past Surgical History:  Procedure Laterality Date  . CARDIAC CATHETERIZATION N/A 04/01/2015   Procedure: Left Heart Cath and Coronary Angiography;  Surgeon: Marykay Lexavid W Harding, MD;  [facility];  Service: Cardiovascular;  Laterality: N/A;  . CATARACT EXTRACTION     x 2  . COLONOSCOPY    . KNEE SURGERY     bilateral  . NASAL SINUS SURGERY    . TONSILLECTOMY AND ADENOIDECTOMY    . UPPER GASTROINTESTINAL ENDOSCOPY      ROS:     As stated in the HPI and negative for all other systems.  PHYSICAL EXAM BP 140/90   Pulse 62   Ht 5\' 8"  (1.727 m)   Wt 168 lb 9.6 oz (76.5 kg)   BMI 25.64 kg/m   GENERAL:  Well appearing NECK:  No jugular  venous distention, waveform within normal limits, carotid upstroke brisk and symmetric, no bruits, no thyromegaly LUNGS:  Clear to auscultation bilaterally CHEST:  Unremarkable HEART:  PMI not displaced or sustained,S1 and S2 within normal limits, no S3, no S4, no clicks, no rubs, apical systolic murmur radiating to the carotids, no diastolic murmurs ABD:  Flat, positive bowel sounds normal in frequency in pitch, no bruits, no rebound, no guarding, no midline pulsatile mass, no hepatomegaly, no splenomegaly EXT:  2 plus pulses right radial and decreased left radial, no edema, no cyanosis no clubbing    Lab Results  Component Value Date   CHOL 109 10/26/2016   TRIG 118.0 10/26/2016   HDL 28.40 (L) 10/26/2016   LDLCALC 57 10/26/2016   EKG:  Normal sinus rhythm, rate 62, axis within normal limits, intervals within normal limits, no acute ST T wave changes.   ASSESSMENT AND PLAN  CAD:   The patient has no new sypmtoms.  No further cardiovascular testing is indicated.  We will continue with aggressive risk reduction and meds as listed.  HTN:  The blood pressure is at target. No change in medications is indicated. We will continue with therapeutic lifestyle changes (TLC). He says it actually run slightly low at times but for now we will leave the meds as listed.  DYSLIPIDEMIA:  LDL is at target.  He will continue the meds as listed.   BRUIT:  He has had only mild plaque.  No change in therapy.   AS:  This was moderate on echo 2017.   I will repeat an echo next month.   HYPERCOAGUABLE:  He is on ASA and warfarin for this and has had no issues with this.   I will order a CBC when he gets his echo.

## 2017-06-27 ENCOUNTER — Ambulatory Visit (INDEPENDENT_AMBULATORY_CARE_PROVIDER_SITE_OTHER): Payer: PPO | Admitting: Cardiology

## 2017-06-27 ENCOUNTER — Encounter: Payer: Self-pay | Admitting: Cardiology

## 2017-06-27 ENCOUNTER — Telehealth: Payer: Self-pay | Admitting: *Deleted

## 2017-06-27 VITALS — BP 140/90 | HR 62 | Ht 68.0 in | Wt 168.6 lb

## 2017-06-27 DIAGNOSIS — Z79899 Other long term (current) drug therapy: Secondary | ICD-10-CM

## 2017-06-27 DIAGNOSIS — I25709 Atherosclerosis of coronary artery bypass graft(s), unspecified, with unspecified angina pectoris: Secondary | ICD-10-CM

## 2017-06-27 DIAGNOSIS — I2510 Atherosclerotic heart disease of native coronary artery without angina pectoris: Secondary | ICD-10-CM

## 2017-06-27 DIAGNOSIS — R5383 Other fatigue: Secondary | ICD-10-CM | POA: Diagnosis not present

## 2017-06-27 DIAGNOSIS — I10 Essential (primary) hypertension: Secondary | ICD-10-CM | POA: Diagnosis not present

## 2017-06-27 DIAGNOSIS — I350 Nonrheumatic aortic (valve) stenosis: Secondary | ICD-10-CM | POA: Diagnosis not present

## 2017-06-27 HISTORY — DX: Atherosclerosis of coronary artery bypass graft(s), unspecified, with unspecified angina pectoris: I25.709

## 2017-06-27 NOTE — Patient Instructions (Signed)

## 2017-06-27 NOTE — Telephone Encounter (Signed)
CBC ordered and mailed to pt..Marland Kitchen

## 2017-06-27 NOTE — Telephone Encounter (Signed)
-----   Message from Rollene RotundaJames Hochrein, MD sent at 06/27/2017  9:32 AM EDT ----- Ask him to get a CBC when he gets his echo.

## 2017-07-03 ENCOUNTER — Other Ambulatory Visit: Payer: Self-pay | Admitting: Cardiology

## 2017-07-04 ENCOUNTER — Other Ambulatory Visit: Payer: Self-pay

## 2017-07-04 MED ORDER — ISOSORBIDE MONONITRATE ER 60 MG PO TB24
60.0000 mg | ORAL_TABLET | Freq: Every day | ORAL | 1 refills | Status: DC
Start: 2017-07-04 — End: 2017-10-05

## 2017-07-05 ENCOUNTER — Encounter: Payer: Self-pay | Admitting: Adult Health

## 2017-07-16 ENCOUNTER — Other Ambulatory Visit: Payer: Self-pay | Admitting: Cardiovascular Disease

## 2017-07-16 ENCOUNTER — Other Ambulatory Visit: Payer: Self-pay | Admitting: Cardiology

## 2017-07-18 ENCOUNTER — Other Ambulatory Visit: Payer: Self-pay

## 2017-07-18 ENCOUNTER — Ambulatory Visit (HOSPITAL_COMMUNITY): Payer: PPO | Attending: Cardiology

## 2017-07-18 DIAGNOSIS — I081 Rheumatic disorders of both mitral and tricuspid valves: Secondary | ICD-10-CM | POA: Insufficient documentation

## 2017-07-18 DIAGNOSIS — I350 Nonrheumatic aortic (valve) stenosis: Secondary | ICD-10-CM | POA: Diagnosis not present

## 2017-07-18 DIAGNOSIS — I420 Dilated cardiomyopathy: Secondary | ICD-10-CM | POA: Insufficient documentation

## 2017-07-28 ENCOUNTER — Other Ambulatory Visit: Payer: Self-pay | Admitting: Physician Assistant

## 2017-07-29 NOTE — Telephone Encounter (Signed)
Please review for refill. Thanks!  

## 2017-08-05 ENCOUNTER — Ambulatory Visit (INDEPENDENT_AMBULATORY_CARE_PROVIDER_SITE_OTHER): Payer: PPO | Admitting: General Practice

## 2017-08-05 DIAGNOSIS — D6862 Lupus anticoagulant syndrome: Secondary | ICD-10-CM | POA: Diagnosis not present

## 2017-08-05 DIAGNOSIS — Z7901 Long term (current) use of anticoagulants: Secondary | ICD-10-CM | POA: Diagnosis not present

## 2017-08-05 LAB — POCT INR: INR: 2.8

## 2017-08-05 NOTE — Progress Notes (Signed)
I agree with this plan.

## 2017-08-05 NOTE — Patient Instructions (Signed)
Pre visit review using our clinic review tool, if applicable. No additional management support is needed unless otherwise documented below in the visit note. 

## 2017-08-26 ENCOUNTER — Other Ambulatory Visit: Payer: Self-pay | Admitting: *Deleted

## 2017-08-26 MED ORDER — ROSUVASTATIN CALCIUM 5 MG PO TABS: ORAL_TABLET | ORAL | 3 refills | Status: DC

## 2017-09-16 ENCOUNTER — Ambulatory Visit: Payer: PPO | Admitting: General Practice

## 2017-09-16 ENCOUNTER — Ambulatory Visit: Payer: PPO | Admitting: Family Medicine

## 2017-09-16 ENCOUNTER — Encounter: Payer: Self-pay | Admitting: Family Medicine

## 2017-09-16 VITALS — BP 148/88 | HR 76 | Temp 97.5°F | Wt 170.1 lb

## 2017-09-16 DIAGNOSIS — Z9229 Personal history of other drug therapy: Secondary | ICD-10-CM | POA: Diagnosis not present

## 2017-09-16 DIAGNOSIS — D6862 Lupus anticoagulant syndrome: Secondary | ICD-10-CM | POA: Diagnosis not present

## 2017-09-16 DIAGNOSIS — Z7901 Long term (current) use of anticoagulants: Secondary | ICD-10-CM | POA: Diagnosis not present

## 2017-09-16 DIAGNOSIS — L03116 Cellulitis of left lower limb: Secondary | ICD-10-CM | POA: Diagnosis not present

## 2017-09-16 LAB — POCT INR: INR: 2.6

## 2017-09-16 MED ORDER — CEPHALEXIN 500 MG PO CAPS: 500.0000 mg | ORAL_CAPSULE | Freq: Three times a day (TID) | ORAL | 0 refills | Status: DC

## 2017-09-16 NOTE — Progress Notes (Signed)
Subjective:     Patient ID: [name], male   DOB: [birth year], 66 y.o.   MRN: [id number]  HPI Patient is seen with some stiffness and redness left anterior knee. He states about one month ago he fell and had abrasions to the knee. About 6 days ago noticed some increased redness and warmth and tenderness. No fever. No chills. No nausea or vomiting. Has not noted any effusion.  Multiple chronic problems including history of hypertension, CAD, GERD, lupus, hyperlipidemia, aortic stenosis. He is on chronic Coumadin therapy  Past Medical History:  Diagnosis Date  . AS (aortic stenosis)    Mild  . Barrett's esophagus 11/11/2014  . CAD S/P percutaneous coronary angioplasty    7 stents per the patient (no record).  Stent to 90% inferior branch of an OM and mild scattered plaque in other vessels [facility]  [facility].  Cath 2016 with patent stents. He did have 70% stenosis in a small LAD that was about 2 mm. Size  . Essential hypertension   . GERD (gastroesophageal reflux disease)   . Glaucoma   . Hypercoagulable state (HCC)   . Hyperlipidemia   . Joint pain   . Lupus   . Pneumonitis    Lupus  . Uses hearing aid    Past Surgical History:  Procedure Laterality Date  . CARDIAC CATHETERIZATION N/A 04/01/2015   Procedure: Left Heart Cath and Coronary Angiography;  Surgeon: Marykay Lexavid W Harding, MD;  [facility];  Service: Cardiovascular;  Laterality: N/A;  . CATARACT EXTRACTION     x 2  . COLONOSCOPY    . KNEE SURGERY     bilateral  . NASAL SINUS SURGERY    . TONSILLECTOMY AND ADENOIDECTOMY    . UPPER GASTROINTESTINAL ENDOSCOPY      reports that  has never smoked. he has never used smokeless tobacco. He reports that he drinks about 3.5 oz of alcohol per week. He reports that he does not use drugs. family history includes Arthritis in his father and mother; CAD (age of onset: 960) in his father; Diabetes in his father and mother; Hypertension in his father and  mother. Allergies  Allergen Reactions  . Sulfa Antibiotics Other (See Comments)    Makes patient jittery, prefers not to take them.      Review of Systems  Constitutional: Negative for chills and fever.  Gastrointestinal: Negative for nausea and vomiting.       Objective:   Physical Exam  Constitutional: He appears well-developed and well-nourished.  Cardiovascular: Normal rate.  Pulmonary/Chest: Breath sounds normal. No respiratory distress. He has no wheezes. He has no rales.  Skin:  Patient has a couple of thick eschars left anterior knee and area of redness about 6 x 6 cm with slight warmth. No fluctuance. Full range of motion left knee. No knee effusion. No pustules       Assessment:     Cellulitis left knee following recent abrasion    Plan:     -Elevate legs frequently -Start Keflex 500 mg 3 times a day -Follow-up immediately for any systemic fever, progressive redness, or other concerns -We recommended follow-up in one week to reassess his pro time

## 2017-09-16 NOTE — Progress Notes (Signed)
I agree with this plan.

## 2017-09-16 NOTE — Patient Instructions (Signed)
Cellulitis, Adult Cellulitis is a skin infection. The infected area is usually red and tender. This condition occurs most often in the arms and lower legs. The infection can travel to the muscles, blood, and underlying tissue and become serious. It is very important to get treated for this condition. What are the causes? Cellulitis is caused by bacteria. The bacteria enter through a break in the skin, such as a cut, burn, insect bite, open sore, or crack. What increases the risk? This condition is more likely to occur in people who:  Have a weak defense system (immune system).  Have open wounds on the skin such as cuts, burns, bites, and scrapes. Bacteria can enter the body through these open wounds.  Are older.  Have diabetes.  Have a type of long-lasting (chronic) liver disease (cirrhosis) or kidney disease.  Use IV drugs.  What are the signs or symptoms? Symptoms of this condition include:  Redness, streaking, or spotting on the skin.  Swollen area of the skin.  Tenderness or pain when an area of the skin is touched.  Warm skin.  Fever.  Chills.  Blisters.  How is this diagnosed? This condition is diagnosed based on a medical history and physical exam. You may also have tests, including:  Blood tests.  Lab tests.  Imaging tests.  How is this treated? Treatment for this condition may include:  Medicines, such as antibiotic medicines or antihistamines.  Supportive care, such as rest and application of cold or warm cloths (cold or warm compresses) to the skin.  Hospital care, if the condition is severe.  The infection usually gets better within 1-2 days of treatment. Follow these instructions at home:  Take over-the-counter and prescription medicines only as told by your health care provider.  If you were prescribed an antibiotic medicine, take it as told by your health care provider. Do not stop taking the antibiotic even if you start to feel  better.  Drink enough fluid to keep your urine clear or pale yellow.  Do not touch or rub the infected area.  Raise (elevate) the infected area above the level of your heart while you are sitting or lying down.  Apply warm or cold compresses to the affected area as told by your health care provider.  Keep all follow-up visits as told by your health care provider. This is important. These visits let your health care provider make sure a more serious infection is not developing. Contact a health care provider if:  You have a fever.  Your symptoms do not improve within 1-2 days of starting treatment.  Your bone or joint underneath the infected area becomes painful after the skin has healed.  Your infection returns in the same area or another area.  You notice a swollen bump in the infected area.  You develop new symptoms.  You have a general ill feeling (malaise) with muscle aches and pains. Get help right away if:  Your symptoms get worse.  You feel very sleepy.  You develop vomiting or diarrhea that persists.  You notice red streaks coming from the infected area.  Your red area gets larger or turns dark in color. This information is not intended to replace advice given to you by your health care provider. Make sure you discuss any questions you have with your health care provider. Document Released: 07/11/2005 Document Revised: 02/09/2016 Document Reviewed: 08/10/2015 Elsevier Interactive Patient Education  2017 [employer]  Follow up immediately for any fever or progressive redness.

## 2017-09-16 NOTE — Patient Instructions (Signed)
Pre visit review using our clinic review tool, if applicable. No additional management support is needed unless otherwise documented below in the visit note.  Continue to take 1 tablet daily except 1/2 tablet on Thursdays.  Re-check in 6 weeks.   

## 2017-09-19 DIAGNOSIS — H401113 Primary open-angle glaucoma, right eye, severe stage: Secondary | ICD-10-CM | POA: Diagnosis not present

## 2017-09-23 ENCOUNTER — Ambulatory Visit: Payer: PPO

## 2017-09-25 ENCOUNTER — Telehealth: Payer: Self-pay | Admitting: Cardiology

## 2017-09-25 NOTE — Telephone Encounter (Signed)
Pt have been having chest discomfort,had to take Nitro. I made an appointment with Theodore Demarkhonda Barrett for tomorrow.Please call to evaluate.

## 2017-09-25 NOTE — Telephone Encounter (Signed)
[id number]

## 2017-09-26 ENCOUNTER — Encounter: Payer: Self-pay | Admitting: Physician Assistant

## 2017-09-26 ENCOUNTER — Ambulatory Visit: Payer: PPO | Admitting: Physician Assistant

## 2017-09-26 VITALS — BP 109/65 | HR 78 | Ht 69.0 in | Wt 168.8 lb

## 2017-09-26 DIAGNOSIS — R079 Chest pain, unspecified: Secondary | ICD-10-CM | POA: Diagnosis not present

## 2017-09-26 DIAGNOSIS — I350 Nonrheumatic aortic (valve) stenosis: Secondary | ICD-10-CM

## 2017-09-26 DIAGNOSIS — D6859 Other primary thrombophilia: Secondary | ICD-10-CM

## 2017-09-26 DIAGNOSIS — E785 Hyperlipidemia, unspecified: Secondary | ICD-10-CM | POA: Diagnosis not present

## 2017-09-26 MED ORDER — NITROGLYCERIN 0.4 MG SL SUBL: 0.4000 mg | SUBLINGUAL_TABLET | SUBLINGUAL | 3 refills | Status: AC | PRN

## 2017-09-26 NOTE — Patient Instructions (Addendum)
Medication Instructions:  Your physician recommends that you continue on your current medications as directed. Please refer to the Current Medication list given to you today.  Labwork: You will need labs at the hospital the morning of the procedure-I will let you know what time to arrive when I call you.  Testing/Procedures: We will call you today to let you know if we need to schedule a cath  Follow-Up: TBD  Any Other Special Instructions Will Be Listed Below (If Applicable).     If you need a refill on your cardiac medications before your next appointment, please call your pharmacy.

## 2017-09-26 NOTE — Progress Notes (Signed)
I agree with cardiac cath.

## 2017-09-26 NOTE — Progress Notes (Addendum)
Cardiology Office Note   Date:  09/26/2017   ID:  [name], DOB 20-Sep-1951, MRN [id number]  PCP:  Shirline FreesNafziger, Cory, NP  Cardiologist: Dr. Antoine PocheHochrein, 06/27/2017 Theodore Demarkhonda Barrett, PA-C 07/13/2015  Chief Complaint  Patient presents with  . Follow-up    History of Present Illness: [name] is a 66 y.o. male with a history of stents LAD, CFX, RCA 2014, mild AS, GERD, DVT/PE on coumadin, HTN, HLD, lupus, LAD 70% at cath 04/2015>>med rx  12/3 PCP visit with knee pain, cellulitis treated with Keflex Phone messages regarding chest discomfort noted 12/12 and appointment made.  [name] presents for cardiology evaluation.   [facility]. He takes extra ASA or ibuprofen (or both) before he exercises.   The other day, he carried wood up to the house and got CP at 8/10, had to take a nitro. He has not taken a nitro in a very long time. He has had other episodes of chest tightness with exertion. Not an 8/10, but up to a 6/10. He has taken an extra ASA a couple of times. He has tried to increase the Ranexa in the past, but his BP did not tolerate it. His nitro is old.  With the CP, he gets SOB and has some weakness, no N&V or diaphoresis.  He feels the pain is his usual angina.  The episodes are much more frequent and intense than they had been in the recent past.  He is compliant with his coumadin.   He had one episode of diarrhea last pm, otherwise no GI issues. He stopped the Keflex, had 1-2 doses to go.    Past Medical History:  Diagnosis Date  . AS (aortic stenosis)    Mild  . Barrett's esophagus 11/11/2014  . CAD S/P percutaneous coronary angioplasty    7 stents per the patient (no record).  Stent to 90% inferior branch of an OM and mild scattered plaque in other vessels [facility]  01/08/13.  Cath 2016 with patent stents. He did have 70% stenosis in a small LAD that was about 2 mm. Size  . Essential hypertension   . GERD  (gastroesophageal reflux disease)   . Glaucoma   . Hypercoagulable state (HCC)   . Hyperlipidemia   . Joint pain   . Lupus   . Pneumonitis    Lupus  . Uses hearing aid     Past Surgical History:  Procedure Laterality Date  . CARDIAC CATHETERIZATION N/A 04/01/2015   Procedure: Left Heart Cath and Coronary Angiography;  Surgeon: Marykay Lexavid W Harding, MD;  [facility];  Service: Cardiovascular;  Laterality: N/A;  . CATARACT EXTRACTION     x 2  . COLONOSCOPY    . KNEE SURGERY     bilateral  . NASAL SINUS SURGERY    . TONSILLECTOMY AND ADENOIDECTOMY    . UPPER GASTROINTESTINAL ENDOSCOPY      Current Outpatient Medications  Medication Sig Dispense Refill  . amLODipine (NORVASC) 5 MG tablet TAKE ONE TABLET BY MOUTH DAILY 90 tablet 6  . aspirin EC 81 MG tablet Take 1 tablet (81 mg total) by mouth daily. 90 tablet 3  . Azelastine HCl 0.15 % SOLN USE 2 SPRAYS BY NASAL ROUTE 2 TIMES A DAY    . brimonidine-timolol (COMBIGAN) 0.2-0.5 % ophthalmic solution Place 1 drop into the right eye every 12 (twelve) hours.     . Flaxseed, Linseed, (FLAX  SEED OIL PO) Take 1 tablet by mouth daily.    . isosorbide mononitrate (IMDUR) 120 MG 24 hr tablet Take 1 tablet (120 mg total) by mouth daily. 90 tablet 3  . isosorbide mononitrate (IMDUR) 60 MG 24 hr tablet Take 1 tablet (60 mg total) by mouth daily. 90 tablet 1  . metoprolol tartrate (LOPRESSOR) 25 MG tablet TAKE 0.5 TABLETS BY MOUTH 2 TIMES DAILY 60 tablet 12  . Multiple Vitamin (MULTIVITAMIN WITH MINERALS) TABS tablet Take 1 tablet by mouth daily.    Marland Kitchen. NASONEX 50 MCG/ACT nasal spray Place 2 sprays into the nose daily.     . Omega-3 Fatty Acids (FISH OIL PO) Take 3 capsules by mouth daily.     Marland Kitchen. omeprazole (PRILOSEC) 40 MG capsule TAKE ONE CAPSULE BY MOUTH DAILY 90 capsule 1  . Probiotic Product (VSL#3 PO) Take 1 capsule by mouth daily. Take for a month    . ramipril (ALTACE) 5 MG capsule TAKE ONE CAPSULE BY MOUTH DAILY 90 capsule 2  .  RANEXA 500 MG 12 hr tablet TAKE 1 TABLET (500 MG TOTAL) BY MOUTH 2 (TWO) TIMES DAILY. 60 tablet 10  . rosuvastatin (CRESTOR) 5 MG tablet TAKE 1 TABLET BY MOUTH ON MONDAY, WEDNESDAY, AND FRIDAY 36 tablet 3  . warfarin (COUMADIN) 4 MG tablet TAKE AS DIRECTED BY ANTICOAGULATION CLINIC 90 tablet 1   No current facility-administered medications for this visit.     Allergies:   Sulfa antibiotics    Social History:  The patient  reports that  has never smoked. he has never used smokeless tobacco. He reports that he drinks about 3.5 oz of alcohol per week. He reports that he does not use drugs.   Family History:  The patient's family history includes Arthritis in his father and mother; CAD (age of onset: 360) in his father; Diabetes in his father and mother; Hypertension in his father and mother.  indicated that his mother is deceased. He indicated that his father is deceased. He indicated that his maternal grandmother is deceased. He indicated that his maternal grandfather is deceased. He indicated that his paternal grandmother is deceased. He indicated that his paternal grandfather is deceased.   ROS:  Please see the history of present illness. All other systems are reviewed and negative.    PHYSICAL EXAM: VS:  BP 109/65   Pulse 78   Ht 5\' 9"  (1.753 m)   Wt 168 lb 12.8 oz (76.6 kg)   BMI 24.93 kg/m  , BMI Body mass index is 24.93 kg/m. GEN: Well nourished, well developed, male in no acute distress  HEENT: normal for age  Neck: no JVD, no carotid bruit, no masses Cardiac: RRR; S1 and S2 both heard, 2-3/6 murmur, no rubs, or gallops Respiratory:  clear to auscultation bilaterally, normal work of breathing GI: soft, nontender, nondistended, + BS MS: no deformity or atrophy; no edema; distal pulses are 2+ in all 4 extremities   Skin: warm and dry, no rash Neuro:  Strength and sensation are intact Psych: euthymic mood, full affect   EKG:  EKG is ordered today. The ekg ordered today  demonstrates SR, HR 67, inverted Keith waves in leads III and aVF that are different from a 06/27/2017 ECG.  No Q waves, no ST elevation  ECHO: 10/04/018 - Left ventricle: The cavity size was normal. Wall thickness was   increased in a pattern of mild LVH. Systolic function was normal.   The estimated ejection fraction was in the  range of 55% to 60%.   Wall motion was normal; there were no regional wall motion   abnormalities. Left ventricular diastolic function parameters   were normal. - Aortic valve: Valve mobility was restricted. There was moderate   to severe stenosis. There was trivial regurgitation.   Mean gradient (S): 38 mm Hg. Peak gradient (S):54 mm Hg. - Mitral valve: There was mild regurgitation. - Left atrium: The atrium was moderately dilated. - Right ventricle: The cavity size was mildly dilated. - Right atrium: The atrium was mildly dilated. - Tricuspid valve: There was mild-moderate regurgitation. Impressions: - Normal LV systolic function; mild LVH; calcified aortic valve   with moderate to severe AS (mean gradient 38 mmHg) and trace AI;   mild MR; modlerate LAE; mild RAE and RVE; mild to moderate TR.  CATH: 04/01/2015  Moderate to severe diffuse disease from the proximal to mid and distal LAD  Prox LAD to Mid LAD lesion, 5% stenosed. The lesion was previously treated with a stent (unknown type) .  Ost LAD lesion, 50% stenosed. Ost LAD to Prox LAD lesion, 50% stenosed. Mid Cx to Dist Cx lesion, 65% stenosed.  Dist LAD lesion, 70% stenosed.  Ost 1st Diag to 1st Diag lesion, 10% stenosed. The lesion was previously treated with a stent (unknown type) .  Ost Cx to Mid Cx lesion, 10% stenosed. The lesion was previously treated with a drug-eluting stent greater than two years ago.  Mid LAD to Dist LAD lesion, 60% stenosed.  Prox RCA to Mid RCA lesion, 20% stenosed. The lesion was previously treated with a stent (unknown type) .  Mid RCA lesion, 30% stenosed.  There  is mild left ventricular systolic dysfunction. EF ~45%  Very difficult situation with extensive disease in the LAD which could quite easily be causing angina. Unfortunately there are not great PCI options as the size of the vessel is maybe 2 mm in greatest and the length of the the diffuse disease is extremely long. I will review images with the other interventional colleagues, but for now would prefer to continue management medical management and discussed the possibility of relatively extensive PCI to the LAD versus even possible single vessel CABG if he has progressive disease.   Recent Labs: 10/26/2016: ALT 28; BUN 15; Creatinine, Ser 0.76; Hemoglobin 11.6; Platelets 185.0; Potassium 4.5; Sodium 137; TSH 3.05    Lipid Panel    Component Value Date/Time   CHOL 109 10/26/2016 0854   TRIG 118.0 10/26/2016 0854   HDL 28.40 (L) 10/26/2016 0854   CHOLHDL 4 10/26/2016 0854   VLDL 23.6 10/26/2016 0854   LDLCALC 57 10/26/2016 0854     Wt Readings from Last 3 Encounters:  09/26/17 168 lb 12.8 oz (76.6 kg)  09/16/17 170 lb 1.6 oz (77.2 kg)  06/27/17 168 lb 9.6 oz (76.5 kg)     Other studies Reviewed: Additional studies/ records that were reviewed today include: office notes, hospital records and testing.  ASSESSMENT AND PLAN:  1.  Chest pain, high risk of cardiac etiology: [name] has an ongoing avenue for ischemia in his distal LAD that was 70% in 2016.  However, he also had a 65% lesion in his mid circumflex and his ostial LAD was 50% stenosed.  These could have progressed.  He has tried to increase his Ranexa in the past his blood pressure did not tolerate it.  I would not increase his isosorbide for the same reason.  I discussed cardiac catheterization with the patient and  his wife. The risks and benefits of a cardiac catheterization including, but not limited to, death, stroke, MI, kidney damage and bleeding were discussed with the patient and his wife who indicate understanding and  agree to proceed.   If Dr. Antoine Poche agrees, we will schedule this for next week.   2.  Chronic anticoagulation: This is followed by his PCP.  He was supposed to get a Coumadin check this week, earlier than usual because he had been on Keflex.  However, the office was closed because of the weather.  He generally does not has to do frequent dose changes and his level is generally therapeutic.  The last time he had a procedure, he was able to hold the Coumadin without cross covering with Lovenox.  His DVT in the past happened after knee surgery.  Keep appointments with his PCP for Coumadin checks, will need to hold this for 5 days prior to the cath.   3.  Dyslipidemia: He is on a low dose of Crestor.  Even taking 5 mg 3 times a week, his LDL is 57.  However, his HDL is 28.   4.  Aortic stenosis: He wonders if his aortic stenosis is causing his chest pain.  His mean gradient was 38 with a peak gradient of 54 at recent echo.  He has not had presyncope or syncope.  He is tolerating workouts at the gym as long as he does not exceed a certain exertional level.  Recheck echo in a year or sooner per Dr. Antoine Poche.    Current medicines are reviewed at length with the patient today.  The patient does not have concerns regarding medicines.  The following changes have been made: Refill his nitro  Labs/ tests ordered today include:  No orders of the defined types were placed in this encounter.    Disposition:   FU with Dr. Antoine Poche  Signed, Theodore Demark, PA-C  09/26/2017 9:17 AM    [facility] Phone: [phone]; Fax: [phone]  This note was written with the assistance of speech recognition software. Please excuse any transcriptional errors.

## 2017-09-26 NOTE — Telephone Encounter (Signed)
Here for appt w/ Theodore Demarkhonda Barrett

## 2017-09-27 ENCOUNTER — Encounter (HOSPITAL_COMMUNITY): Payer: Self-pay | Admitting: Physician Assistant

## 2017-09-27 DIAGNOSIS — I200 Unstable angina: Secondary | ICD-10-CM | POA: Insufficient documentation

## 2017-09-27 NOTE — Telephone Encounter (Signed)
Left detailed message for pt with cath directions

## 2017-09-27 NOTE — Telephone Encounter (Signed)
Patient instructions reviewed and patient verbalized understanding.

## 2017-09-27 NOTE — H&P (Signed)
Cardiology Office Note   Date:  09/26/2017   ID:  [name], DOB 12-11-1950, MRN [id number]  PCP:  Shirline FreesNafziger, Cory, NP        Cardiologist: Dr. Antoine PocheHochrein, 06/27/2017 Theodore Demarkhonda Terese Heier, PA-C 07/13/2015     Chief Complaint  Patient presents with  . Follow-up    History of Present Illness: [name] is a 66 y.o. male with a history of stents LAD, CFX, RCA 2014, mild AS, GERD, DVT/PE on coumadin, HTN, HLD, lupus, LAD 70% at cath 04/2015>>med rx  12/3 PCP visit with knee pain, cellulitis treated with Keflex Phone messages regarding chest discomfort noted 12/12 and appointment made.  [name] presents for cardiology evaluation.   [facility] [employer] [facility] with [facility]. He takes extra ASA or ibuprofen (or both) before he exercises.   The other day, he carried wood up to the house and got CP at 8/10, had to take a nitro. He has not taken a nitro in a very long time. He has had other episodes of chest tightness with exertion. Not an 8/10, but up to a 6/10. He has taken an extra ASA a couple of times. He has tried to increase the Ranexa in the past, but his BP did not tolerate it. His nitro is old.  With the CP, he gets SOB and has some weakness, no N&V or diaphoresis.  He feels the pain is his usual angina.  The episodes are much more frequent and intense than they had been in the recent past.  He is compliant with his coumadin.   He had one episode of diarrhea last pm, otherwise no GI issues. He stopped the Keflex, had 1-2 doses to go.        Past Medical History:  Diagnosis Date  . AS (aortic stenosis)    Mild  . Etheline Geppert's esophagus 11/11/2014  . CAD S/P percutaneous coronary angioplasty    7 stents per the patient (no record).  Stent to 90% inferior branch of an OM and mild scattered plaque in other vessels [facility]  01/08/13.  Cath 2016 with patent stents. He did have 70% stenosis in a small LAD that was about 2 mm. Size  . Essential  hypertension   . GERD (gastroesophageal reflux disease)   . Glaucoma   . Hypercoagulable state (HCC)   . Hyperlipidemia   . Joint pain   . Lupus   . Pneumonitis    Lupus  . Uses hearing aid          Past Surgical History:  Procedure Laterality Date  . CARDIAC CATHETERIZATION N/A 04/01/2015   Procedure: Left Heart Cath and Coronary Angiography;  Surgeon: Marykay Lexavid W Harding, MD;  [facility];  Service: Cardiovascular;  Laterality: N/A;  . CATARACT EXTRACTION     x 2  . COLONOSCOPY    . KNEE SURGERY     bilateral  . NASAL SINUS SURGERY    . TONSILLECTOMY AND ADENOIDECTOMY    . UPPER GASTROINTESTINAL ENDOSCOPY            Current Outpatient Medications  Medication Sig Dispense Refill  . amLODipine (NORVASC) 5 MG tablet TAKE ONE TABLET BY MOUTH DAILY 90 tablet 6  . aspirin EC 81 MG tablet Take 1 tablet (81 mg total) by mouth daily. 90 tablet 3  . Azelastine HCl 0.15 % SOLN USE 2 SPRAYS BY NASAL ROUTE 2 TIMES A DAY    . brimonidine-timolol (  COMBIGAN) 0.2-0.5 % ophthalmic solution Place 1 drop into the right eye every 12 (twelve) hours.     . Flaxseed, Linseed, (FLAX SEED OIL PO) Take 1 tablet by mouth daily.    . isosorbide mononitrate (IMDUR) 120 MG 24 hr tablet Take 1 tablet (120 mg total) by mouth daily. 90 tablet 3  . isosorbide mononitrate (IMDUR) 60 MG 24 hr tablet Take 1 tablet (60 mg total) by mouth daily. 90 tablet 1  . metoprolol tartrate (LOPRESSOR) 25 MG tablet TAKE 0.5 TABLETS BY MOUTH 2 TIMES DAILY 60 tablet 12  . Multiple Vitamin (MULTIVITAMIN WITH MINERALS) TABS tablet Take 1 tablet by mouth daily.    Marland Kitchen NASONEX 50 MCG/ACT nasal spray Place 2 sprays into the nose daily.     . Omega-3 Fatty Acids (FISH OIL PO) Take 3 capsules by mouth daily.     Marland Kitchen omeprazole (PRILOSEC) 40 MG capsule TAKE ONE CAPSULE BY MOUTH DAILY 90 capsule 1  . Probiotic Product (VSL#3 PO) Take 1 capsule by mouth daily. Take for a month    .  ramipril (ALTACE) 5 MG capsule TAKE ONE CAPSULE BY MOUTH DAILY 90 capsule 2  . RANEXA 500 MG 12 hr tablet TAKE 1 TABLET (500 MG TOTAL) BY MOUTH 2 (TWO) TIMES DAILY. 60 tablet 10  . rosuvastatin (CRESTOR) 5 MG tablet TAKE 1 TABLET BY MOUTH ON MONDAY, WEDNESDAY, AND FRIDAY 36 tablet 3  . warfarin (COUMADIN) 4 MG tablet TAKE AS DIRECTED BY ANTICOAGULATION CLINIC 90 tablet 1   No current facility-administered medications for this visit.     Allergies:   Sulfa antibiotics    Social History:  The patient  reports that  has never smoked. he has never used smokeless tobacco. He reports that he drinks about 3.5 oz of alcohol per week. He reports that he does not use drugs.   Family History:  The patient's family history includes Arthritis in his father and mother; CAD (age of onset: 84) in his father; Diabetes in his father and mother; Hypertension in his father and mother.  Family Status: Pt indicated that his mother is deceased. He indicated that his father is deceased. He indicated that his maternal grandmother is deceased. He indicated that his maternal grandfather is deceased. He indicated that his paternal grandmother is deceased. He indicated that his paternal grandfather is deceased.   ROS:  Please see the history of present illness. All other systems are reviewed and negative.    PHYSICAL EXAM: VS:  BP 109/65   Pulse 78   Ht 5\' 9"  (1.753 m)   Wt 168 lb 12.8 oz (76.6 kg)   BMI 24.93 kg/m  , BMI Body mass index is 24.93 kg/m. GEN: Well nourished, well developed, male in no acute distress  HEENT: normal for age  Neck: no JVD, no carotid bruit, no masses Cardiac: RRR; S1 and S2 both heard, 2-3/6 murmur, no rubs, or gallops Respiratory:  clear to auscultation bilaterally, normal work of breathing GI: soft, nontender, nondistended, + BS MS: no deformity or atrophy; no edema; distal pulses are 2+ in all 4 extremities   Skin: warm and dry, no rash Neuro:  Strength and sensation  are intact Psych: euthymic mood, full affect   EKG:  EKG is ordered today. The ekg ordered today demonstrates SR, HR 67, inverted T waves in leads III and aVF that are different from a 06/27/2017 ECG.  No Q waves, no ST elevation  ECHO: 10/04/018 - Left ventricle: The cavity size  was normal. Wall thickness was increased in a pattern of mild LVH. Systolic function was normal. The estimated ejection fraction was in the range of 55% to 60%. Wall motion was normal; there were no regional wall motion abnormalities. Left ventricular diastolic function parameters were normal. - Aortic valve: Valve mobility was restricted. There was moderate to severe stenosis. There was trivial regurgitation.   Mean gradient (S): 38 mm Hg. Peak gradient (S):54 mm Hg. - Mitral valve: There was mild regurgitation. - Left atrium: The atrium was moderately dilated. - Right ventricle: The cavity size was mildly dilated. - Right atrium: The atrium was mildly dilated. - Tricuspid valve: There was mild-moderate regurgitation. Impressions: - Normal LV systolic function; mild LVH; calcified aortic valve with moderate to severe AS (mean gradient 38 mmHg) and trace AI; mild MR; modlerate LAE; mild RAE and RVE; mild to moderate TR.  CATH: 04/01/2015  Moderate to severe diffuse disease from the proximal to mid and distal LAD  Prox LAD to Mid LAD lesion, 5% stenosed. The lesion was previously treated with a stent (unknown type) .  Ost LAD lesion, 50% stenosed. Ost LAD to Prox LAD lesion, 50% stenosed. Mid Cx to Dist Cx lesion, 65% stenosed.  Dist LAD lesion, 70% stenosed.  Ost 1st Diag to 1st Diag lesion, 10% stenosed. The lesion was previously treated with a stent (unknown type) .  Ost Cx to Mid Cx lesion, 10% stenosed. The lesion was previously treated with a drug-eluting stent greater than two years ago.  Mid LAD to Dist LAD lesion, 60% stenosed.  Prox RCA to Mid RCA lesion, 20% stenosed.  The lesion was previously treated with a stent (unknown type) .  Mid RCA lesion, 30% stenosed.  There is mild left ventricular systolic dysfunction. EF ~45% Very difficult situation with extensive disease in the LAD which could quite easily be causing angina. Unfortunately there are not great PCI options as the size of the vessel is maybe 2 mm in greatest and the length of the the diffuse disease is extremely long. I will review images with the other interventional colleagues, but for now would prefer to continue management medical management and discussed the possibility of relatively extensive PCI to the LAD versus even possible single vessel CABG if he has progressive disease.   Recent Labs: 10/26/2016: ALT 28; BUN 15; Creatinine, Ser 0.76; Hemoglobin 11.6; Platelets 185.0; Potassium 4.5; Sodium 137; TSH 3.05    Lipid Panel Labs(Brief)          Component Value Date/Time   CHOL 109 10/26/2016 0854   TRIG 118.0 10/26/2016 0854   HDL 28.40 (L) 10/26/2016 0854   CHOLHDL 4 10/26/2016 0854   VLDL 23.6 10/26/2016 0854   LDLCALC 57 10/26/2016 0854          Wt Readings from Last 3 Encounters:  09/26/17 168 lb 12.8 oz (76.6 kg)  09/16/17 170 lb 1.6 oz (77.2 kg)  06/27/17 168 lb 9.6 oz (76.5 kg)     Other studies Reviewed: Additional studies/ records that were reviewed today include: office notes, hospital records and testing.  ASSESSMENT AND PLAN:  1.  Chest pain, high risk of cardiac etiology: [name] has an ongoing avenue for ischemia in his distal LAD that was 70% in 2016.  However, he also had a 65% lesion in his mid circumflex and his ostial LAD was 50% stenosed.  These could have progressed.  He has tried to increase his Ranexa in the past his blood pressure did not  tolerate it.  I would not increase his isosorbide for the same reason.  I discussed cardiac catheterization with the patient and his wife. The risks and benefits of a cardiac catheterization  including, but not limited to, death, stroke, MI, kidney damage and bleeding were discussed with the patient and his wife who indicate understanding and agree to proceed.   Dr Antoine PocheHochrein is in agreement, the procedure is scheduled for 12/19   2.  Chronic anticoagulation: This is followed by his PCP.  He was supposed to get a Coumadin check this week, earlier than usual because he had been on Keflex.  However, the office was closed because of the weather.  He generally does not has to do frequent dose changes and his level is generally therapeutic.  The last time he had a procedure, he was able to hold the Coumadin without cross covering with Lovenox.  His DVT in the past happened after knee surgery.  Keep appointments with his PCP for Coumadin checks, will need to hold this for 5 days prior to the cath.   3.  Dyslipidemia: He is on a low dose of Crestor.  Even taking 5 mg 3 times a week, his LDL is 57.  However, his HDL is 28.   4.  Aortic stenosis: He wonders if his aortic stenosis is causing his chest pain.  His mean gradient was 38 with a peak gradient of 54 at recent echo.  He has not had presyncope or syncope.  He is tolerating workouts at the gym as long as he does not exceed a certain exertional level.  Recheck echo in a year or sooner per Dr. Antoine PocheHochrein.    Current medicines are reviewed at length with the patient today.  The patient does not have concerns regarding medicines.  The following changes have been made: Refill his nitro  Labs/ tests ordered today include:  No orders of the defined types were placed in this encounter.    Disposition:   FU with Dr. Antoine PocheHochrein  Signed, Theodore Demarkhonda Jaiceon Collister, PA-C  09/26/2017 9:17 AM    [facility] Phone: [phone]; Fax: [phone]  This note was written with the assistance of speech recognition software. Please excuse any transcriptional errors.

## 2017-09-27 NOTE — Telephone Encounter (Signed)
[name] is calling to follow up on angioplasty that is supposed to be scheduled for him . Please call . Thanks

## 2017-09-27 NOTE — Progress Notes (Signed)
CALLED AND LEFT DETAILED MESSAGE x2 FOR PT WILL CALL AGAIN MONDAY

## 2017-09-30 ENCOUNTER — Ambulatory Visit: Payer: PPO

## 2017-10-01 ENCOUNTER — Telehealth: Payer: Self-pay

## 2017-10-01 NOTE — Telephone Encounter (Signed)
Patient [facility] scheduled for:  10/02/2017 @ 1330 Verified arrival time and place:  [facility] @ 1130 Confirmed AM meds to be taken pre-cath with sip of water: Take ASA Hold warfarin-last dose December 14 Confirmed patient has responsible person to drive home post procedure and observe patient for 24 hours:  yes Addl concerns:  none

## 2017-10-02 ENCOUNTER — Inpatient Hospital Stay (HOSPITAL_COMMUNITY)
Admission: RE | Admit: 2017-10-02 | Discharge: 2017-10-05 | DRG: 287 | Disposition: A | Discharge status: Home / Self Care | Payer: PPO | Source: Ambulatory Visit | Attending: Cardiovascular Disease | Admitting: Cardiovascular Disease

## 2017-10-02 ENCOUNTER — Encounter (HOSPITAL_COMMUNITY)
Admission: RE | Disposition: A | Discharge status: Home / Self Care | Payer: Self-pay | Source: Ambulatory Visit | Attending: Cardiovascular Disease

## 2017-10-02 DIAGNOSIS — Z8261 Family history of arthritis: Secondary | ICD-10-CM

## 2017-10-02 DIAGNOSIS — Z955 Presence of coronary angioplasty implant and graft: Secondary | ICD-10-CM

## 2017-10-02 DIAGNOSIS — Z86718 Personal history of other venous thrombosis and embolism: Secondary | ICD-10-CM | POA: Diagnosis not present

## 2017-10-02 DIAGNOSIS — Z9861 Coronary angioplasty status: Secondary | ICD-10-CM

## 2017-10-02 DIAGNOSIS — I729 Aneurysm of unspecified site: Secondary | ICD-10-CM | POA: Diagnosis not present

## 2017-10-02 DIAGNOSIS — M329 Systemic lupus erythematosus, unspecified: Secondary | ICD-10-CM | POA: Diagnosis present

## 2017-10-02 DIAGNOSIS — Z8249 Family history of ischemic heart disease and other diseases of the circulatory system: Secondary | ICD-10-CM | POA: Diagnosis not present

## 2017-10-02 DIAGNOSIS — I200 Unstable angina: Secondary | ICD-10-CM | POA: Diagnosis not present

## 2017-10-02 DIAGNOSIS — I209 Angina pectoris, unspecified: Secondary | ICD-10-CM | POA: Diagnosis not present

## 2017-10-02 DIAGNOSIS — Z882 Allergy status to sulfonamides status: Secondary | ICD-10-CM

## 2017-10-02 DIAGNOSIS — R079 Chest pain, unspecified: Secondary | ICD-10-CM | POA: Diagnosis not present

## 2017-10-02 DIAGNOSIS — I350 Nonrheumatic aortic (valve) stenosis: Principal | ICD-10-CM

## 2017-10-02 DIAGNOSIS — D6859 Other primary thrombophilia: Secondary | ICD-10-CM | POA: Diagnosis not present

## 2017-10-02 DIAGNOSIS — R0989 Other specified symptoms and signs involving the circulatory and respiratory systems: Secondary | ICD-10-CM | POA: Diagnosis not present

## 2017-10-02 DIAGNOSIS — Z79899 Other long term (current) drug therapy: Secondary | ICD-10-CM | POA: Diagnosis not present

## 2017-10-02 DIAGNOSIS — I10 Essential (primary) hypertension: Secondary | ICD-10-CM | POA: Diagnosis present

## 2017-10-02 DIAGNOSIS — I25110 Atherosclerotic heart disease of native coronary artery with unstable angina pectoris: Secondary | ICD-10-CM | POA: Diagnosis not present

## 2017-10-02 DIAGNOSIS — I2510 Atherosclerotic heart disease of native coronary artery without angina pectoris: Secondary | ICD-10-CM

## 2017-10-02 DIAGNOSIS — D6862 Lupus anticoagulant syndrome: Secondary | ICD-10-CM | POA: Diagnosis present

## 2017-10-02 DIAGNOSIS — M1711 Unilateral primary osteoarthritis, right knee: Secondary | ICD-10-CM | POA: Diagnosis present

## 2017-10-02 DIAGNOSIS — I724 Aneurysm of artery of lower extremity: Secondary | ICD-10-CM | POA: Diagnosis not present

## 2017-10-02 DIAGNOSIS — Z7901 Long term (current) use of anticoagulants: Secondary | ICD-10-CM | POA: Diagnosis not present

## 2017-10-02 DIAGNOSIS — Z833 Family history of diabetes mellitus: Secondary | ICD-10-CM | POA: Diagnosis not present

## 2017-10-02 DIAGNOSIS — Z7982 Long term (current) use of aspirin: Secondary | ICD-10-CM

## 2017-10-02 DIAGNOSIS — H409 Unspecified glaucoma: Secondary | ICD-10-CM | POA: Diagnosis present

## 2017-10-02 DIAGNOSIS — M79609 Pain in unspecified limb: Secondary | ICD-10-CM | POA: Diagnosis not present

## 2017-10-02 DIAGNOSIS — Z01810 Encounter for preprocedural cardiovascular examination: Secondary | ICD-10-CM | POA: Diagnosis not present

## 2017-10-02 DIAGNOSIS — M3213 Lung involvement in systemic lupus erythematosus: Secondary | ICD-10-CM | POA: Diagnosis present

## 2017-10-02 DIAGNOSIS — K219 Gastro-esophageal reflux disease without esophagitis: Secondary | ICD-10-CM | POA: Diagnosis present

## 2017-10-02 DIAGNOSIS — I25709 Atherosclerosis of coronary artery bypass graft(s), unspecified, with unspecified angina pectoris: Secondary | ICD-10-CM | POA: Diagnosis not present

## 2017-10-02 DIAGNOSIS — E785 Hyperlipidemia, unspecified: Secondary | ICD-10-CM | POA: Diagnosis not present

## 2017-10-02 DIAGNOSIS — Z9849 Cataract extraction status, unspecified eye: Secondary | ICD-10-CM

## 2017-10-02 HISTORY — DX: Atherosclerosis of coronary artery bypass graft(s), unspecified, with unspecified angina pectoris: I25.709

## 2017-10-02 HISTORY — DX: Nonrheumatic aortic (valve) stenosis: I35.0

## 2017-10-02 HISTORY — DX: Anemia, unspecified: D64.9

## 2017-10-02 LAB — PROTIME-INR
INR: 1.09
PROTHROMBIN TIME: 14 s (ref 11.4–15.2)

## 2017-10-02 LAB — POCT I-STAT 3, VENOUS BLOOD GAS (G3P V)
Bicarbonate: 25.5 mmol/L (ref 20.0–28.0)
O2 Saturation: 73 %
PCO2 VEN: 44.3 mmHg (ref 44.0–60.0)
PH VEN: 7.368 (ref 7.250–7.430)
TCO2: 27 mmol/L (ref 22–32)
pO2, Ven: 40 mmHg (ref 32.0–45.0)

## 2017-10-02 LAB — BASIC METABOLIC PANEL
Anion gap: 7 (ref 5–15)
BUN: 14 mg/dL (ref 6–20)
CO2: 24 mmol/L (ref 22–32)
Calcium: 8.7 mg/dL — ABNORMAL LOW (ref 8.9–10.3)
Chloride: 101 mmol/L (ref 101–111)
Creatinine, Ser: 0.82 mg/dL (ref 0.61–1.24)
GFR calc non Af Amer: >60 mL/min (ref >60)
Glucose, Bld: 87 mg/dL (ref 65–99)
POTASSIUM: 4.1 mmol/L (ref 3.5–5.1)
SODIUM: 132 mmol/L — AB (ref 135–145)

## 2017-10-02 LAB — CBC
HCT: 33.1 % — ABNORMAL LOW (ref 39.0–52.0)
HEMOGLOBIN: 10.6 g/dL — AB (ref 13.0–17.0)
MCH: 26.6 pg (ref 26.0–34.0)
MCHC: 32 g/dL (ref 30.0–36.0)
MCV: 83.2 fL (ref 78.0–100.0)
Platelets: 170 10*3/uL (ref 150–400)
RBC: 3.98 MIL/uL — AB (ref 4.22–5.81)
RDW: 16.1 % — ABNORMAL HIGH (ref 11.5–15.5)
WBC: 5.8 10*3/uL (ref 4.0–10.5)

## 2017-10-02 LAB — POCT I-STAT 3, ART BLOOD GAS (G3+)
Acid-Base Excess: 3 mmol/L — ABNORMAL HIGH (ref 0.0–2.0)
BICARBONATE: 27.4 mmol/L (ref 20.0–28.0)
O2 Saturation: 98 %
PCO2 ART: 41.1 mmHg (ref 32.0–48.0)
PH ART: 7.433 (ref 7.350–7.450)
PO2 ART: 109 mmHg — AB (ref 83.0–108.0)
TCO2: 29 mmol/L (ref 22–32)

## 2017-10-02 SURGERY — RIGHT HEART CATH AND CORONARY ANGIOGRAPHY
Anesthesia: LOCAL

## 2017-10-02 MED ORDER — IOPAMIDOL (ISOVUE-370) INJECTION 76%
INTRAVENOUS | Status: DC | PRN
  Administered 2017-10-02: 70 mL via INTRA_ARTERIAL

## 2017-10-02 MED ORDER — SODIUM CHLORIDE 0.9% FLUSH
3.0000 mL | Freq: Two times a day (BID) | INTRAVENOUS | Status: DC
Start: 2017-10-02 — End: 2017-10-02

## 2017-10-02 MED ORDER — MIDAZOLAM HCL 2 MG/2ML IJ SOLN
INTRAMUSCULAR | Status: AC
Start: 2017-10-02 — End: 2017-10-02
  Filled 2017-10-02: qty 2

## 2017-10-02 MED ORDER — SODIUM CHLORIDE 0.9 % WEIGHT BASED INFUSION
3.0000 mL/kg/h | INTRAVENOUS | Status: DC
  Administered 2017-10-02: 3 mL/kg/h via INTRAVENOUS

## 2017-10-02 MED ORDER — ASPIRIN 81 MG PO CHEW: 81.0000 mg | CHEWABLE_TABLET | ORAL | Status: DC

## 2017-10-02 MED ORDER — DIAZEPAM 5 MG PO TABS: 5.0000 mg | ORAL_TABLET | Freq: Four times a day (QID) | ORAL | Status: DC | PRN

## 2017-10-02 MED ORDER — ISOSORBIDE MONONITRATE ER 60 MG PO TB24
120.0000 mg | ORAL_TABLET | Freq: Every day | ORAL | Status: DC
  Administered 2017-10-03 – 2017-10-05 (×3): 120 mg via ORAL
  Filled 2017-10-02 (×3): qty 2

## 2017-10-02 MED ORDER — LIDOCAINE HCL (PF) 1 % IJ SOLN
INTRAMUSCULAR | Status: DC | PRN
  Administered 2017-10-02: 20 mL via INTRADERMAL

## 2017-10-02 MED ORDER — FENTANYL CITRATE (PF) 100 MCG/2ML IJ SOLN
INTRAMUSCULAR | Status: DC | PRN
  Administered 2017-10-02: 25 ug via INTRAVENOUS

## 2017-10-02 MED ORDER — SODIUM CHLORIDE 0.9 % IV SOLN
INTRAVENOUS | Status: DC
  Administered 2017-10-02 – 2017-10-03 (×2): via INTRAVENOUS

## 2017-10-02 MED ORDER — ACETAMINOPHEN 325 MG PO TABS
650.0000 mg | ORAL_TABLET | ORAL | Status: DC | PRN
  Administered 2017-10-04 – 2017-10-05 (×2): 650 mg via ORAL
  Filled 2017-10-02 (×2): qty 2

## 2017-10-02 MED ORDER — SODIUM CHLORIDE 0.9 % IV SOLN: 250.0000 mL | INTRAVENOUS | Status: DC | PRN

## 2017-10-02 MED ORDER — AMLODIPINE BESYLATE 5 MG PO TABS
5.0000 mg | ORAL_TABLET | Freq: Every day | ORAL | Status: DC
  Administered 2017-10-02 – 2017-10-05 (×4): 5 mg via ORAL
  Filled 2017-10-02 (×4): qty 1

## 2017-10-02 MED ORDER — SODIUM CHLORIDE 0.9% FLUSH
3.0000 mL | Freq: Two times a day (BID) | INTRAVENOUS | Status: DC
  Administered 2017-10-02 – 2017-10-05 (×5): 3 mL via INTRAVENOUS

## 2017-10-02 MED ORDER — MIDAZOLAM HCL 2 MG/2ML IJ SOLN
INTRAMUSCULAR | Status: DC | PRN
  Administered 2017-10-02 (×2): 1 mg via INTRAVENOUS

## 2017-10-02 MED ORDER — ONDANSETRON HCL 4 MG/2ML IJ SOLN
4.0000 mg | Freq: Four times a day (QID) | INTRAMUSCULAR | Status: DC | PRN
  Administered 2017-10-04: 4 mg via INTRAVENOUS
  Filled 2017-10-02: qty 2

## 2017-10-02 MED ORDER — ATORVASTATIN CALCIUM 80 MG PO TABS
80.0000 mg | ORAL_TABLET | Freq: Every day | ORAL | Status: DC
  Administered 2017-10-02 – 2017-10-04 (×3): 80 mg via ORAL
  Filled 2017-10-02 (×3): qty 1

## 2017-10-02 MED ORDER — IOPAMIDOL (ISOVUE-370) INJECTION 76%
INTRAVENOUS | Status: AC
Start: 2017-10-02 — End: 2017-10-02
  Filled 2017-10-02: qty 100

## 2017-10-02 MED ORDER — VERAPAMIL HCL 2.5 MG/ML IV SOLN
INTRAVENOUS | Status: AC
  Filled 2017-10-02: qty 2

## 2017-10-02 MED ORDER — HEPARIN (PORCINE) IN NACL 100-0.45 UNIT/ML-% IJ SOLN
1550.0000 [IU]/h | INTRAMUSCULAR | Status: AC
  Administered 2017-10-03: 1100 [IU]/h via INTRAVENOUS
  Administered 2017-10-03: 1550 [IU]/h via INTRAVENOUS
  Filled 2017-10-02 (×2): qty 250

## 2017-10-02 MED ORDER — ASPIRIN 81 MG PO CHEW
81.0000 mg | CHEWABLE_TABLET | Freq: Every day | ORAL | Status: DC
  Administered 2017-10-02 – 2017-10-05 (×5): 81 mg via ORAL
  Filled 2017-10-02 (×4): qty 1

## 2017-10-02 MED ORDER — SODIUM CHLORIDE 0.9% FLUSH: 3.0000 mL | INTRAVENOUS | Status: DC | PRN

## 2017-10-02 MED ORDER — LIDOCAINE HCL (PF) 1 % IJ SOLN
INTRAMUSCULAR | Status: AC
  Filled 2017-10-02: qty 30

## 2017-10-02 MED ORDER — PANTOPRAZOLE SODIUM 40 MG PO TBEC
40.0000 mg | DELAYED_RELEASE_TABLET | Freq: Every day | ORAL | Status: DC
  Administered 2017-10-03 – 2017-10-05 (×3): 40 mg via ORAL
  Filled 2017-10-02 (×3): qty 1

## 2017-10-02 MED ORDER — HEPARIN (PORCINE) IN NACL 2-0.9 UNIT/ML-% IJ SOLN
INTRAMUSCULAR | Status: AC
  Filled 2017-10-02: qty 1000

## 2017-10-02 MED ORDER — SODIUM CHLORIDE 0.9 % WEIGHT BASED INFUSION: 1.0000 mL/kg/h | INTRAVENOUS | Status: DC

## 2017-10-02 MED ORDER — METOPROLOL TARTRATE 25 MG PO TABS
25.0000 mg | ORAL_TABLET | Freq: Two times a day (BID) | ORAL | Status: DC
  Administered 2017-10-02 – 2017-10-03 (×3): 25 mg via ORAL
  Filled 2017-10-02 (×3): qty 1

## 2017-10-02 MED ORDER — FENTANYL CITRATE (PF) 100 MCG/2ML IJ SOLN
INTRAMUSCULAR | Status: AC
  Filled 2017-10-02: qty 2

## 2017-10-02 MED ORDER — HEPARIN (PORCINE) IN NACL 2-0.9 UNIT/ML-% IJ SOLN
INTRAMUSCULAR | Status: AC | PRN
  Administered 2017-10-02: 1000 mL

## 2017-10-02 SURGICAL SUPPLY — 18 items
CATH 5FR JL3.5 JR4 ANG PIG MP (CATHETERS) ×3 IMPLANT
CATH INFINITI 5FR JL4 (CATHETERS) ×3 IMPLANT
CATH INFINITI 6F FL5 (CATHETERS) ×3 IMPLANT
CATH INFINITI JR4 5F (CATHETERS) IMPLANT
CATH SWAN GANZ 7F STRAIGHT (CATHETERS) ×3 IMPLANT
GLIDESHEATH SLEND SS 6F .021 (SHEATH) IMPLANT
GUIDEWIRE INQWIRE 1.5J.035X260 (WIRE) IMPLANT
INQWIRE 1.5J .035X260CM (WIRE)
KIT HEART LEFT (KITS) ×3 IMPLANT
PACK CARDIAC CATHETERIZATION (CUSTOM PROCEDURE TRAY) ×3 IMPLANT
SHEATH PINNACLE 6F 10CM (SHEATH) ×3 IMPLANT
SHEATH PINNACLE 7F 10CM (SHEATH) ×3 IMPLANT
SYR MEDRAD MARK V 150ML (SYRINGE) ×3 IMPLANT
TRANSDUCER W/STOPCOCK (MISCELLANEOUS) ×3 IMPLANT
TUBING CIL FLEX 10 FLL-RA (TUBING) ×3 IMPLANT
WIRE EMERALD 3MM-J .025X260CM (WIRE) ×3 IMPLANT
WIRE EMERALD 3MM-J .035X150CM (WIRE) ×3 IMPLANT
WIRE EMERALD ST .035X260CM (WIRE) ×3 IMPLANT

## 2017-10-02 NOTE — Interval H&P Note (Signed)
Cath Lab Visit (complete for each Cath Lab visit)  Clinical Evaluation Leading to the Procedure:   ACS: No.  Non-ACS:    Anginal Classification: CCS III  Anti-ischemic medical therapy: Maximal Therapy (2 or more classes of medications)  Non-Invasive Test Results: No non-invasive testing performed  Prior CABG: No previous CABG      History and Physical Interval Note:  10/02/2017 5:45 PM  [name]  has presented today for surgery, with the diagnosis of unstable angina  The various methods of treatment have been discussed with the patient and family. After consideration of risks, benefits and other options for treatment, the patient has consented to  Procedure(s): LEFT HEART CATH AND CORONARY ANGIOGRAPHY (N/A) as a surgical intervention .  The patient's history has been reviewed, patient examined, no change in status, stable for surgery.  I have reviewed the patient's chart and labs.  Questions were answered to the patient's satisfaction.     [name]

## 2017-10-02 NOTE — Interval H&P Note (Signed)
Cath Lab Visit (complete for each Cath Lab visit)  Clinical Evaluation Leading to the Procedure:   ACS: No.  Non-ACS:    Anginal Classification: CCS III  Anti-ischemic medical therapy: Maximal Therapy (2 or more classes of medications)  Non-Invasive Test Results: No non-invasive testing performed  Prior CABG: No previous CABG      History and Physical Interval Note:  10/02/2017 5:47 PM  [name]  has presented today for surgery, with the diagnosis of unstable angina  The various methods of treatment have been discussed with the patient and family. After consideration of risks, benefits and other options for treatment, the patient has consented to  Procedure(s): LEFT HEART CATH AND CORONARY ANGIOGRAPHY (N/A) as a surgical intervention .  The patient's history has been reviewed, patient examined, no change in status, stable for surgery.  I have reviewed the patient's chart and labs.  Questions were answered to the patient's satisfaction.     [name]

## 2017-10-02 NOTE — Progress Notes (Signed)
ANTICOAGULATION CONSULT NOTE - Initial Consult  Pharmacy Consult for Heparin Indication: CAD (pending CABG eval), hx DVT/PE, lupus  Allergies  Allergen Reactions  . Sulfa Antibiotics Other (See Comments)    Makes patient jittery, prefers not to take them.     Patient Measurements: Height: 5\' 9"  (175.3 cm) Weight: 170 lb (77.1 kg) IBW/kg (Calculated) : 70.7 Heparin Dosing Weight: 77.1 kg  Vital Signs: Temp: 97.8 F (36.6 C) (12/19 1147) Temp Source: Oral (12/19 1147) BP: 141/91 (12/19 1847) Pulse Rate: 99 (12/19 1852)  Labs: Recent Labs    10/02/17 1246  HGB 10.6*  HCT 33.1*  PLT 170  LABPROT 14.0  INR 1.09  CREATININE 0.82    Estimated Creatinine Clearance: 88.6 mL/min (by C-G formula based on SCr of 0.82 mg/dL).   Medical History: Past Medical History:  Diagnosis Date  . AS (aortic stenosis)    Mild  . Barrett's esophagus 11/11/2014  . CAD S/P percutaneous coronary angioplasty    7 stents per the patient (no record).  Stent to 90% inferior branch of an OM and mild scattered plaque in other vessels [facility]  01/08/13.  Cath 2016 with patent stents. He did have 70% stenosis in a small LAD that was about 2 mm. Size  . Essential hypertension   . GERD (gastroesophageal reflux disease)   . Glaucoma   . Hypercoagulable state (HCC)   . Hyperlipidemia   . Joint pain   . Lupus   . Pneumonitis    Lupus  . Unstable angina (HCC) 09/27/2017  . Uses hearing aid     Medications:  Medications Prior to Admission  Medication Sig Dispense Refill Last Dose  . amLODipine (NORVASC) 5 MG tablet TAKE ONE TABLET BY MOUTH DAILY (Patient taking differently: Take 5mg  by mouth every evening) 90 tablet 6 10/01/2017 at 1800  . aspirin EC 325 MG tablet Take 162.5 mg by mouth daily. May take an additional 162.5 mg as needed for chest tightness   10/02/2017 at 0600  . Azelastine HCl 0.15 % SOLN USE 2 SPRAYS BY NASAL ROUTE AS NEEDED FOR CONGESTION   Past Week at Unknown time  .  brimonidine (ALPHAGAN) 0.15 % ophthalmic solution Place 1 drop into both eyes 2 (two) times daily.   10/02/2017 at 0600  . calcium carbonate (TUMS - DOSED IN MG ELEMENTAL CALCIUM) 500 MG chewable tablet Chew 0.5 tablets by mouth as needed for indigestion or heartburn.   Past Week at Unknown time  . Emollient (GOLD BOND ULTIMATE HEALING) CREA Apply 1 application topically daily as needed (wound care).   Past Week at Unknown time  . Flaxseed, Linseed, (FLAX SEED OIL) 1000 MG CAPS Take 1,000 mg by mouth 2 (two) times daily.   10/01/2017 at Unknown time  . ibuprofen (ADVIL,MOTRIN) 200 MG tablet Take 200-400 mg by mouth 2 (two) times daily as needed for headache or moderate pain.   Past Week at Unknown time  . isosorbide mononitrate (IMDUR) 120 MG 24 hr tablet Take 1 tablet (120 mg total) by mouth daily. 90 tablet 3 10/02/2017 at 0600  . isosorbide mononitrate (IMDUR) 60 MG 24 hr tablet Take 1 tablet (60 mg total) by mouth daily. 90 tablet 1 10/02/2017 at 0600  . loperamide (IMODIUM A-D) 2 MG tablet Take 2-4 mg by mouth 2 (two) times daily as needed for diarrhea or loose stools.   Past Month at Unknown time  . metoprolol tartrate (LOPRESSOR) 25 MG tablet TAKE 0.5 TABLETS BY MOUTH 2 TIMES  DAILY 60 tablet 12 10/02/2017 at 0600  . NASONEX 50 MCG/ACT nasal spray Place 2 sprays into the nose daily as needed (allergies).    10/02/2017 at 0600  . nitroGLYCERIN (NITROSTAT) 0.4 MG SL tablet Place 1 tablet (0.4 mg total) under the tongue every 5 (five) minutes as needed for chest pain. 25 tablet 3 09/18/2017  . Omega-3 Fatty Acids (FISH OIL PO) Take 2 capsules by mouth daily.   10/01/2017 at Unknown time  . omeprazole (PRILOSEC) 40 MG capsule TAKE ONE CAPSULE BY MOUTH DAILY 90 capsule 1 10/02/2017 at 0600  . Probiotic CAPS Take 1 capsule by mouth daily.   10/01/2017 at Unknown time  . ramipril (ALTACE) 5 MG capsule TAKE ONE CAPSULE BY MOUTH DAILY 90 capsule 2 10/02/2017 at 0600  . RANEXA 500 MG 12 hr tablet TAKE 1  TABLET (500 MG TOTAL) BY MOUTH 2 (TWO) TIMES DAILY. 60 tablet 10 10/02/2017 at 0600  . rosuvastatin (CRESTOR) 5 MG tablet TAKE 1 TABLET BY MOUTH ON MONDAY, WEDNESDAY, AND FRIDAY 36 tablet 3 Past Week at Unknown time  . timolol (BETIMOL) 0.5 % ophthalmic solution Place 1 drop into both eyes 2 (two) times daily.   10/02/2017 at 0600  . Turmeric Curcumin 500 MG CAPS Take 500 mg by mouth 2 (two) times daily.   10/01/2017 at Unknown time  . warfarin (COUMADIN) 4 MG tablet TAKE AS DIRECTED BY ANTICOAGULATION CLINIC (Patient taking differently: Take 2-4 mg by mouth See admin instructions. Take 4 mg once daily in the evening except Thursday take 2 mg once daily in the evening) 90 tablet 1 09/26/2017 at 1900    Assessment: 66 yo M with hx CAD presents 12/19 for scheduled cardiac cath due to [place].  Found to have multivessel CAD and aortic stenosis.  Plan for CABG + AVR eval.    Heparin to start post-cath, 8 hours s/p sheath removal.  Sheath out at 1900.  Will start heparin 0300 on 12/20.  Goal of Therapy:  Heparin level 0.3-0.7 units/ml Monitor platelets by anticoagulation protocol: Yes   Plan:  Heparin infusion at 1100 units/hr - start at 0300 12/20 AM Heparin level in 8 hours (1100 AM 12/20) Heparin level and CBC daily while on heparin  Toys 'R' UsKimberly Lawarence Meek, Pharm.D., BCPS Clinical Pharmacist  10/02/2017 7:28 PM

## 2017-10-03 ENCOUNTER — Other Ambulatory Visit: Payer: Self-pay | Admitting: *Deleted

## 2017-10-03 ENCOUNTER — Inpatient Hospital Stay (HOSPITAL_COMMUNITY): Payer: PPO

## 2017-10-03 ENCOUNTER — Encounter (HOSPITAL_COMMUNITY): Payer: Self-pay

## 2017-10-03 ENCOUNTER — Other Ambulatory Visit: Payer: Self-pay

## 2017-10-03 DIAGNOSIS — I350 Nonrheumatic aortic (valve) stenosis: Secondary | ICD-10-CM

## 2017-10-03 DIAGNOSIS — I2510 Atherosclerotic heart disease of native coronary artery without angina pectoris: Secondary | ICD-10-CM

## 2017-10-03 DIAGNOSIS — M79609 Pain in unspecified limb: Secondary | ICD-10-CM

## 2017-10-03 DIAGNOSIS — I724 Aneurysm of artery of lower extremity: Secondary | ICD-10-CM

## 2017-10-03 LAB — CBC
HCT: 34.3 % — ABNORMAL LOW (ref 39.0–52.0)
HEMATOCRIT: 31.9 % — AB (ref 39.0–52.0)
HEMOGLOBIN: 10.3 g/dL — AB (ref 13.0–17.0)
HEMOGLOBIN: 11.3 g/dL — AB (ref 13.0–17.0)
MCH: 27.3 pg (ref 26.0–34.0)
MCH: 27.7 pg (ref 26.0–34.0)
MCHC: 32.3 g/dL (ref 30.0–36.0)
MCHC: 32.9 g/dL (ref 30.0–36.0)
MCV: 84.6 fL (ref 78.0–100.0)
MCV: 84.1 fL (ref 78.0–100.0)
Platelets: 171 10*3/uL (ref 150–400)
Platelets: 174 10*3/uL (ref 150–400)
RBC: 3.77 MIL/uL — AB (ref 4.22–5.81)
RBC: 4.08 MIL/uL — ABNORMAL LOW (ref 4.22–5.81)
RDW: 16.5 % — ABNORMAL HIGH (ref 11.5–15.5)
RDW: 16.5 % — AB (ref 11.5–15.5)
WBC: 5.8 10*3/uL (ref 4.0–10.5)
WBC: 6.1 10*3/uL (ref 4.0–10.5)

## 2017-10-03 LAB — HEPARIN LEVEL (UNFRACTIONATED)
HEPARIN UNFRACTIONATED: 0.17 [IU]/mL — AB (ref 0.30–0.70)
HEPARIN UNFRACTIONATED: 0.23 [IU]/mL — AB (ref 0.30–0.70)

## 2017-10-03 LAB — BASIC METABOLIC PANEL
ANION GAP: 7 (ref 5–15)
BUN: 12 mg/dL (ref 6–20)
CHLORIDE: 105 mmol/L (ref 101–111)
CO2: 25 mmol/L (ref 22–32)
Calcium: 8.3 mg/dL — ABNORMAL LOW (ref 8.9–10.3)
Creatinine, Ser: 0.86 mg/dL (ref 0.61–1.24)
GFR calc non Af Amer: >60 mL/min (ref >60)
Glucose, Bld: 84 mg/dL (ref 65–99)
Potassium: 4 mmol/L (ref 3.5–5.1)
SODIUM: 137 mmol/L (ref 135–145)

## 2017-10-03 LAB — MRSA PCR SCREENING: MRSA BY PCR: NEGATIVE

## 2017-10-03 MED ORDER — THROMBIN 20000 UNITS EX KIT
20000.0000 [IU] | PACK | Freq: Once | CUTANEOUS | Status: DC
  Filled 2017-10-03: qty 1

## 2017-10-03 MED ORDER — THROMBIN (RECOMBINANT) 20000 UNITS EX SOLR
20000.0000 [IU] | Freq: Once | CUTANEOUS | Status: AC
  Administered 2017-10-04: 20000 [IU] via TOPICAL
  Filled 2017-10-03: qty 20000

## 2017-10-03 MED FILL — Verapamil HCl IV Soln 2.5 MG/ML: INTRAVENOUS | Qty: 2 | Status: AC

## 2017-10-03 NOTE — Consult Note (Signed)
Hospital Consult    Reason for Consult:  Right femoral PSA Requesting Physician:  Jens SomCrenshaw MRN #:  [id number]  History of Present Illness: This is a 66 y.o. male wit hx of CAD and cardiac stents who was admitted to the hospital yesterday and underwent cardiac catheterization and was found to have 3 vessel CAD and moderate to severe aortic stenosis.  Today on exam, he was found to have a right femoral bruit and a duplex was obtained.  This revealed a PSA measuring 1.7cm x 1.4cm with a neck measuring 0.45cm.   [facility] was consulted for evaluation.  Pt is on lifelong Coumadin for hx of DVT/lupus.  He is on a statin and aspirin daily.  He is on a beta blocker and CCB for blood pressure management.  He is on a PPI for GERD.  Past Medical History:  Diagnosis Date  . AS (aortic stenosis)    Mild  . Barrett's esophagus 11/11/2014  . CAD S/P percutaneous coronary angioplasty    7 stents per the patient (no record).  Stent to 90% inferior branch of an OM and mild scattered plaque in other vessels [facility]  01/08/13.  Cath 2016 with patent stents. He did have 70% stenosis in a small LAD that was about 2 mm. Size  . Essential hypertension   . GERD (gastroesophageal reflux disease)   . Glaucoma   . Hypercoagulable state (HCC)   . Hyperlipidemia   . Joint pain   . Lupus   . Pneumonitis    Lupus  . Unstable angina (HCC) 09/27/2017  . Uses hearing aid     Past Surgical History:  Procedure Laterality Date  . CARDIAC CATHETERIZATION N/A 04/01/2015   Procedure: Left Heart Cath and Coronary Angiography;  Surgeon: Marykay Lexavid W Harding, MD;  [facility];  Service: Cardiovascular;  Laterality: N/A;  . CATARACT EXTRACTION     x 2  . COLONOSCOPY    . KNEE SURGERY     bilateral  . NASAL SINUS SURGERY    . TONSILLECTOMY AND ADENOIDECTOMY    . UPPER GASTROINTESTINAL ENDOSCOPY      Allergies  Allergen Reactions  . Sulfa Antibiotics Other (See Comments)    Makes patient jittery,  prefers not to take them.     Prior to Admission medications   Medication Sig Start Date End Date Taking? Authorizing Provider  amLODipine (NORVASC) 5 MG tablet TAKE ONE TABLET BY MOUTH DAILY Patient taking differently: Take 5mg  by mouth every evening 07/03/17  Yes Rollene RotundaHochrein, James, MD  aspirin EC 325 MG tablet Take 162.5 mg by mouth daily. May take an additional 162.5 mg as needed for chest tightness   Yes [provider]  Azelastine HCl 0.15 % SOLN USE 2 SPRAYS BY NASAL ROUTE AS NEEDED FOR CONGESTION 03/21/17  Yes [provider]  brimonidine (ALPHAGAN) 0.15 % ophthalmic solution Place 1 drop into both eyes 2 (two) times daily.   Yes [provider]  calcium carbonate (TUMS - DOSED IN MG ELEMENTAL CALCIUM) 500 MG chewable tablet Chew 0.5 tablets by mouth as needed for indigestion or heartburn.   Yes [provider]  Emollient (GOLD BOND ULTIMATE HEALING) CREA Apply 1 application topically daily as needed (wound care).   Yes [provider]  Flaxseed, Linseed, (FLAX SEED OIL) 1000 MG CAPS Take 1,000 mg by mouth 2 (two) times daily.   Yes [provider]  ibuprofen (ADVIL,MOTRIN) 200 MG tablet Take 200-400 mg by mouth 2 (two) times  daily as needed for headache or moderate pain.   Yes [provider]  isosorbide mononitrate (IMDUR) 120 MG 24 hr tablet Take 1 tablet (120 mg total) by mouth daily. 10/17/16  Yes Rollene Rotunda, MD  isosorbide mononitrate (IMDUR) 60 MG 24 hr tablet Take 1 tablet (60 mg total) by mouth daily. 07/04/17 10/02/17 Yes Rollene Rotunda, MD  loperamide (IMODIUM A-D) 2 MG tablet Take 2-4 mg by mouth 2 (two) times daily as needed for diarrhea or loose stools.   Yes [provider]  metoprolol tartrate (LOPRESSOR) 25 MG tablet TAKE 0.5 TABLETS BY MOUTH 2 TIMES DAILY 07/29/17  Yes Rollene Rotunda, MD  NASONEX 50 MCG/ACT nasal spray Place 2 sprays into the nose daily as needed (allergies).  12/29/13  Yes [provider]  nitroGLYCERIN (NITROSTAT) 0.4 MG SL tablet Place 1 tablet (0.4 mg total) under the tongue every 5 (five) minutes as needed for chest pain. 09/26/17 12/25/17 Yes Barrett, Joline Salt, PA-C  Omega-3 Fatty Acids (FISH OIL PO) Take 2 capsules by mouth daily.   Yes [provider]  omeprazole (PRILOSEC) 40 MG capsule TAKE ONE CAPSULE BY MOUTH DAILY 04/30/17  Yes Nafziger, Kandee Keen, NP  Probiotic CAPS Take 1 capsule by mouth daily.   Yes [provider]  ramipril (ALTACE) 5 MG capsule TAKE ONE CAPSULE BY MOUTH DAILY 06/25/17  Yes Nafziger, Kandee Keen, NP  RANEXA 500 MG 12 hr tablet TAKE 1 TABLET (500 MG TOTAL) BY MOUTH 2 (TWO) TIMES DAILY. 07/16/17  Yes Hochrein, Fayrene Fearing, MD  rosuvastatin (CRESTOR) 5 MG tablet TAKE 1 TABLET BY MOUTH ON MONDAY, WEDNESDAY, AND FRIDAY 08/26/17  Yes Hochrein, Fayrene Fearing, MD  timolol (BETIMOL) 0.5 % ophthalmic solution Place 1 drop into both eyes 2 (two) times daily.   Yes [provider]  Turmeric Curcumin 500 MG CAPS Take 500 mg by mouth 2 (two) times daily.   Yes [provider]  warfarin (COUMADIN) 4 MG tablet TAKE AS DIRECTED BY ANTICOAGULATION CLINIC Patient taking differently: Take 2-4 mg by mouth See admin instructions. Take 4 mg once daily in the evening except Thursday take 2 mg once daily in the evening 06/18/17   Shirline Frees, NP    Social History   Socioeconomic History  . Marital status: Married    Spouse name: Not on file  . Number of children: 2  . Years of education: Not on file  . Highest education level: Not on file  Social Needs  . Financial resource strain: Not on file  . Food insecurity - worry: Not on file  . Food insecurity - inability: Not on file  . Transportation needs - medical: Not on file  . Transportation needs - non-medical: Not on file  Occupational History  . Not on file  Tobacco Use  . Smoking status: Never Smoker  . Smokeless tobacco: Never Used  Substance and Sexual Activity  . Alcohol use: Yes      Alcohol/week: 3.5 oz    Types: 7 Standard drinks or equivalent per week    Comment: does not drink as much; 6pack a  month  . Drug use: No  . Sexual activity: Not on file  Other Topics Concern  . Not on file  Social History Narrative   Married for 42 years   Has two grown children and three grandchildren.    Retired on disability   No longer have pets. Moved from a home to an apartment   Likes to read,workout, travel.  Family History  Problem Relation Age of Onset  . Diabetes Mother   . Hypertension Mother   . Arthritis Mother   . Diabetes Father   . Hypertension Father   . Arthritis Father   . CAD Father 9060       died age 10972 of MI    ROS: [x]  Positive   [ ]  Negative   [ ]  All sytems reviewed and are negative  Cardiac: [x]  chest pain/pressure & shortness of breath []  palpitations []  SOB lying flat []  DOE  Vascular: [x]  mild groin pain-right  Pulmonary: []  productive cough []  asthma/wheezing []  home O2  Neurologic: []  weakness in []  arms []  legs []  numbness in []  arms []  legs []  hx of CVA []  mini stroke [] difficulty speaking or slurred speech []  temporary loss of vision in one eye []  dizziness  Hematologic: []  hx of cancer []  bleeding problems []  problems with blood clotting easily  Endocrine:   []  diabetes []  thyroid disease  GI []  vomiting blood []  blood in stool [x]  GERD  GU: []  CKD/renal failure []  HD--[]  M/W/F or []  T/T/S []  burning with urination []  blood in urine  Psychiatric: []  anxiety []  depression  Musculoskeletal: []  arthritis []  joint pain  Integumentary: []  rashes []  ulcers  Constitutional: []  fever []  chills   Physical Examination  Vitals:   10/03/17 0959 10/03/17 1221  BP:  103/75  Pulse: 76 66  Resp:  13  Temp:  97.8 F (36.6 C)  SpO2:  98%   Body mass index is 25.1 kg/m.  General:  WDWN in NAD Gait: Not observed HENT: WNL, normocephalic Pulmonary: normal non-labored breathing Skin: mild  ecchymosis right groin  Vascular Exam/Pulses: Right groin tender to palpation Extremities:bilateral feet are warm Musculoskeletal: no muscle wasting or atrophy  Neurologic: A&O X 3;  No focal weakness or paresthesias are detected; speech is fluent/normal Psychiatric:  The pt has Normal affect.  CBC    Component Value Date/Time   WBC 6.1 10/03/2017 0956   RBC 4.08 (L) 10/03/2017 0956   HGB 11.3 (L) 10/03/2017 0956   HCT 34.3 (L) 10/03/2017 0956   PLT 174 10/03/2017 0956   MCV 84.1 10/03/2017 0956   MCH 27.7 10/03/2017 0956   MCHC 32.9 10/03/2017 0956   RDW 16.5 (H) 10/03/2017 0956   LYMPHSABS 1.9 10/26/2016 0854   MONOABS 0.6 10/26/2016 0854   EOSABS 0.1 10/26/2016 0854   BASOSABS 0.0 10/26/2016 0854    BMET    Component Value Date/Time   NA 137 10/03/2017 0419   K 4.0 10/03/2017 0419   CL 105 10/03/2017 0419   CO2 25 10/03/2017 0419   GLUCOSE 84 10/03/2017 0419   BUN 12 10/03/2017 0419   CREATININE 0.86 10/03/2017 0419   CREATININE 0.73 02/28/2016 0838   CALCIUM 8.3 (L) 10/03/2017 0419   GFRNONAA >60 10/03/2017 0419   GFRNONAA >89 02/28/2016 0838   GFRAA >60 10/03/2017 0419   GFRAA >89 02/28/2016 0838    COAGS: Lab Results  Component Value Date   INR 1.09 10/02/2017   INR 2.6 09/16/2017   INR 2.8 08/05/2017     Non-Invasive Vascular Imaging:   Right groin ultrasound to rule out pseudoaneurysm completed.    Preliminary report:  There is a pseudoaneurysm noted in the right groin measuring 1.7cm X 1.4cm with a neck measuring  0.45cm.    Statin:  Yes.   Beta Blocker:  Yes.   Aspirin:  Yes.   ACEI:  No. ARB:  No. CCB use:  Yes Other antiplatelets/anticoagulants:  Yes.   Coumadin outpatient (heparin gtt now)   ASSESSMENT/PLAN: This is a 66 y.o. male with MVCAD and moderate to severe AS now with right femoral PSA.   -pt has a right femoral PSA that is non compressible.  Unfortunately, the pt is on heparin and therefore cannot get a thrombin injection.   Will need to hold heparin and will attempt thrombin injection tomorrow.  If this fails, he will need to go to the OR, but will be high risk for this given his CAD and AS.     Doreatha Massed, PA-C [facility] [phone]    I have interviewed and examined patient with PA and agree with assessment and plan above. Case discussed with Dr. Jens Som and duplex reviewed. He is high risk operative candidate. Will plan for thrombin injection tomorrow at bedside. Heparin to be held at 0200 in preparation and patient will be npo.   Caedmon Louque C. Randie Heinz, MD [facility] [place] Office: [phone] Pager: [phone]

## 2017-10-03 NOTE — Progress Notes (Addendum)
ANTICOAGULATION CONSULT NOTE  Pharmacy Consult for Heparin Indication: CAD (pending CABG eval), hx DVT/PE, lupus  Allergies  Allergen Reactions  . Sulfa Antibiotics Other (See Comments)    Makes patient jittery, prefers not to take them.     Patient Measurements: Height: 5\' 9"  (175.3 cm) Weight: 170 lb (77.1 kg) IBW/kg (Calculated) : 70.7 Heparin Dosing Weight: 77.1 kg  Vital Signs: Temp: 98 F (36.7 C) (12/20 0745) Temp Source: Oral (12/20 0745) BP: 125/78 (12/20 0745) Pulse Rate: 76 (12/20 0959)  Labs: Recent Labs    10/02/17 1246 10/03/17 0419 10/03/17 0956  HGB 10.6* 10.3*  --   HCT 33.1* 31.9*  --   PLT 170 171  --   LABPROT 14.0  --   --   INR 1.09  --   --   HEPARINUNFRC  --   --  0.17*  CREATININE 0.82 0.86  --     Estimated Creatinine Clearance: 84.5 mL/min (by C-G formula based on SCr of 0.86 mg/dL).   Medical History: Past Medical History:  Diagnosis Date  . AS (aortic stenosis)    Mild  . Barrett's esophagus 11/11/2014  . CAD S/P percutaneous coronary angioplasty    7 stents per the patient (no record).  Stent to 90% inferior branch of an OM and mild scattered plaque in other vessels [facility]  01/08/13.  Cath 2016 with patent stents. He did have 70% stenosis in a small LAD that was about 2 mm. Size  . Essential hypertension   . GERD (gastroesophageal reflux disease)   . Glaucoma   . Hypercoagulable state (HCC)   . Hyperlipidemia   . Joint pain   . Lupus   . Pneumonitis    Lupus  . Unstable angina (HCC) 09/27/2017  . Uses hearing aid     Medications:  Medications Prior to Admission  Medication Sig Dispense Refill Last Dose  . amLODipine (NORVASC) 5 MG tablet TAKE ONE TABLET BY MOUTH DAILY (Patient taking differently: Take 5mg  by mouth every evening) 90 tablet 6 10/01/2017 at 1800  . aspirin EC 325 MG tablet Take 162.5 mg by mouth daily. May take an additional 162.5 mg as needed for chest tightness   10/02/2017 at 0600  . Azelastine  HCl 0.15 % SOLN USE 2 SPRAYS BY NASAL ROUTE AS NEEDED FOR CONGESTION   Past Week at Unknown time  . brimonidine (ALPHAGAN) 0.15 % ophthalmic solution Place 1 drop into both eyes 2 (two) times daily.   10/02/2017 at 0600  . calcium carbonate (TUMS - DOSED IN MG ELEMENTAL CALCIUM) 500 MG chewable tablet Chew 0.5 tablets by mouth as needed for indigestion or heartburn.   Past Week at Unknown time  . Emollient (GOLD BOND ULTIMATE HEALING) CREA Apply 1 application topically daily as needed (wound care).   Past Week at Unknown time  . Flaxseed, Linseed, (FLAX SEED OIL) 1000 MG CAPS Take 1,000 mg by mouth 2 (two) times daily.   10/01/2017 at Unknown time  . ibuprofen (ADVIL,MOTRIN) 200 MG tablet Take 200-400 mg by mouth 2 (two) times daily as needed for headache or moderate pain.   Past Week at Unknown time  . isosorbide mononitrate (IMDUR) 120 MG 24 hr tablet Take 1 tablet (120 mg total) by mouth daily. 90 tablet 3 10/02/2017 at 0600  . isosorbide mononitrate (IMDUR) 60 MG 24 hr tablet Take 1 tablet (60 mg total) by mouth daily. 90 tablet 1 10/02/2017 at 0600  . loperamide (IMODIUM A-D) 2 MG tablet  Take 2-4 mg by mouth 2 (two) times daily as needed for diarrhea or loose stools.   Past Month at Unknown time  . metoprolol tartrate (LOPRESSOR) 25 MG tablet TAKE 0.5 TABLETS BY MOUTH 2 TIMES DAILY 60 tablet 12 10/02/2017 at 0600  . NASONEX 50 MCG/ACT nasal spray Place 2 sprays into the nose daily as needed (allergies).    10/02/2017 at 0600  . nitroGLYCERIN (NITROSTAT) 0.4 MG SL tablet Place 1 tablet (0.4 mg total) under the tongue every 5 (five) minutes as needed for chest pain. 25 tablet 3 09/18/2017  . Omega-3 Fatty Acids (FISH OIL PO) Take 2 capsules by mouth daily.   10/01/2017 at Unknown time  . omeprazole (PRILOSEC) 40 MG capsule TAKE ONE CAPSULE BY MOUTH DAILY 90 capsule 1 10/02/2017 at 0600  . Probiotic CAPS Take 1 capsule by mouth daily.   10/01/2017 at Unknown time  . ramipril (ALTACE) 5 MG capsule  TAKE ONE CAPSULE BY MOUTH DAILY 90 capsule 2 10/02/2017 at 0600  . RANEXA 500 MG 12 hr tablet TAKE 1 TABLET (500 MG TOTAL) BY MOUTH 2 (TWO) TIMES DAILY. 60 tablet 10 10/02/2017 at 0600  . rosuvastatin (CRESTOR) 5 MG tablet TAKE 1 TABLET BY MOUTH ON MONDAY, WEDNESDAY, AND FRIDAY 36 tablet 3 Past Week at Unknown time  . timolol (BETIMOL) 0.5 % ophthalmic solution Place 1 drop into both eyes 2 (two) times daily.   10/02/2017 at 0600  . Turmeric Curcumin 500 MG CAPS Take 500 mg by mouth 2 (two) times daily.   10/01/2017 at Unknown time  . warfarin (COUMADIN) 4 MG tablet TAKE AS DIRECTED BY ANTICOAGULATION CLINIC (Patient taking differently: Take 2-4 mg by mouth See admin instructions. Take 4 mg once daily in the evening except Thursday take 2 mg once daily in the evening) 90 tablet 1 09/26/2017 at 1900    Assessment: 66 yo M on warfarin PTA for hx lupus, DVT/PE with hx CAD presents 12/19 for scheduled cardiac cath due to UA. Found to have multivessel CAD and aortic stenosis. Plan for CABG + AVR eval.  Initial heparin level low this AM at 0.17. INR 1.09 off warfarin. CBC stable. No bleed or IV line issues per RN.  Goal of Therapy:  Heparin level 0.3-0.7 units/ml Monitor platelets by anticoagulation protocol: Yes   Plan:  Increase heparin infusion to 1350 units/hr Heparin level in 8 hours Monitor heparin level/CBC daily, s/sx bleeding Warfarin on hold - f/u CVTS plans  Babs BertinHaley Girl Schissler, PharmD, BCPS Clinical Pharmacist Clinical phone for 10/03/2017 until 3:30pm: [phone] If after 3:30pm, please call main pharmacy at: [phone] 10/03/2017 11:24 AM

## 2017-10-03 NOTE — Consult Note (Signed)
[address]       [place] [id number]             [phone]          CARDIOTHORACIC SURGERY CONSULTATION REPORT  PCP is Shirline FreesNafziger, Cory, NP Referring Provider is Olga Millersrenshaw, Brian, MD Primary Cardiologist is Rollene RotundaHochrein, James, MD  Reason for consultation:  Severe aortic stenosis and coronary artery disease  HPI:  Patient is a 66 year old male with history of aortic stenosis, coronary artery disease with multiple previous PCI and stents, hypertension, lupus, remote history of DVT with reported hypercoagulable state on long-term warfarin anticoagulation, GE reflux disease, Barrett's esophagus, and hyperlipidemia who has been referred for surgical consultation to discuss treatment options for management of severe aortic stenosis and multivessel coronary artery disease.  Patient has a long history of coronary artery disease dating back several years.  He had reported history of PCI and stenting of the obtuse marginal branch of the left circumflex coronary artery, left anterior descending coronary artery, and the right coronary artery, all of which were performed [facility].  Approximately 4 [place] he has been followed by Dr. Antoine PocheHochrein.  Catheterization in May 2016 revealed patent stents in the left anterior descending coronary artery with 70% in-stent stenosis, and patent stents in both the left circumflex and the right coronary distributions.  He was treated medically.  He has known history of heart murmur and previous echocardiograms have demonstrated the presence of mild aortic stenosis with normal left ventricular systolic function.  Follow-up echocardiogram performed July 18, 2017 revealed significant progression of disease with moderate to severe aortic stenosis.  Peak velocity across the aortic valve range from 3.2-4.0 m/s corresponding to mean transvalvular gradient estimated 38 mmHg.  The DVI was reported 0.19.   Medical therapy was recommended.    The patient states that he has developed progressive symptoms of fatigue and over the past month classical symptoms of angina pectoris.  He states that initially in the morning he feels well and [facility] a [facility] basis.  By the end of the afternoon he gets fatigued easily and developed substernal chest tightness.  Symptoms have gotten much worse over the past month.  Following the recent snowstorm he was carrying a pile of wood to his house and developed severe substernal chest pain for which she took nitroglycerin.  The chest pain finally resolved.  Since then he has had more frequent episodes of chest pain and chest tightness with moderate and low-level activity.  He was seen in the office on September 26, 2017 by Theodore Demarkhonda Barrett and scheduled for elective outpatient catheterization on October 02, 2017.  Catheterization revealed severe multivessel coronary artery disease including 80% ostial stenosis of the left anterior descending coronary artery, 80% stenosis of the mid left anterior descending coronary artery, ostial left circumflex stenosis, and 95% in-stent restenosis in the mid right coronary artery.  The patient was hospitalized and cardiothoracic surgical consultation requested.  He has developed a pseudoaneurysm of the right femoral artery following catheterization and was evaluated by Dr. Randie Heinzain earlier today.  Injection of the pseudoaneurysm has been planned for tomorrow.  The patient is married and [place].  He has been disabled for approximately 10 years secondary to his numerous medical problems.  He has remained functionally independent and reasonably active until recently.  He describes a long history of  exertional chest tightness but symptoms have progressed dramatically over the past month.  He gets short of breath with exertion.  He denies resting shortness of breath, PND, orthopnea, or  lower extremity edema.  He reports occasional dizzy spells without history of syncope.  His mobility is slightly limited secondary to degenerative arthritis in his right knee.   Past Medical History:  Diagnosis Date  . Anemia   . AS (aortic stenosis)    Mild  . Barrett's esophagus 11/11/2014  . CAD S/P percutaneous coronary angioplasty    7 stents per the patient (no record).  Stent to 90% inferior branch of an OM and mild scattered plaque in other vessels [facility]  01/08/13.  Cath 2016 with patent stents. He did have 70% stenosis in a small LAD that was about 2 mm. Size  . Coronary artery disease involving coronary bypass graft of native heart with angina pectoris (HCC) 06/27/2017  . Essential hypertension   . GERD (gastroesophageal reflux disease)   . Glaucoma   . Heart murmur   . Hypercoagulable state (HCC)   . Hyperlipidemia   . Joint pain   . Lupus   . Pneumonitis    Lupus  . Severe aortic stenosis   . Unstable angina (HCC) 09/27/2017  . Uses hearing aid     Past Surgical History:  Procedure Laterality Date  . CARDIAC CATHETERIZATION N/A 04/01/2015   Procedure: Left Heart Cath and Coronary Angiography;  Surgeon: Marykay Lex, MD;  Location: [facility] INVASIVE CV LAB;  Service: Cardiovascular;  Laterality: N/A;  . CATARACT EXTRACTION     x 2  . COLONOSCOPY    . KNEE SURGERY     bilateral  . NASAL SINUS SURGERY    . TONSILLECTOMY AND ADENOIDECTOMY    . UPPER GASTROINTESTINAL ENDOSCOPY      Family History  Problem Relation Age of Onset  . Diabetes Mother   . Hypertension Mother   . Arthritis Mother   . Diabetes Father   . Hypertension Father   . Arthritis Father   . CAD Father 62       died age 98 of MI    Social History   Socioeconomic History  . Marital status: Married    Spouse name: Not on file  . Number of children: 2  . Years of education: Not on file  . Highest education level: Not on file  Social Needs  . Financial resource strain: Not on file  .  Food insecurity - worry: Not on file  . Food insecurity - inability: Not on file  . Transportation needs - medical: Not on file  . Transportation needs - non-medical: Not on file  Occupational History  . Not on file  Tobacco Use  . Smoking status: Never Smoker  . Smokeless tobacco: Never Used  Substance and Sexual Activity  . Alcohol use: Yes    Alcohol/week: 3.5 oz    Types: 7 Standard drinks or equivalent per week    Comment: does not drink as much; 6pack a  month  . Drug use: No  . Sexual activity: Not on file  Other Topics Concern  . Not on file  Social History Narrative   Married for 42 years   Has two grown children and three grandchildren.    Retired on disability   No longer have pets. Moved from a home to an apartment   Likes to read,workout, travel.     Prior to Admission medications   Medication  Sig Start Date End Date Taking? Authorizing Provider  amLODipine (NORVASC) 5 MG tablet TAKE ONE TABLET BY MOUTH DAILY Patient taking differently: Take 5mg  by mouth every evening 07/03/17  Yes Rollene Rotunda, MD  aspirin EC 325 MG tablet Take 162.5 mg by mouth daily. May take an additional 162.5 mg as needed for chest tightness   Yes [provider]  Azelastine HCl 0.15 % SOLN USE 2 SPRAYS BY NASAL ROUTE AS NEEDED FOR CONGESTION 03/21/17  Yes [provider]  brimonidine (ALPHAGAN) 0.15 % ophthalmic solution Place 1 drop into both eyes 2 (two) times daily.   Yes [provider]  calcium carbonate (TUMS - DOSED IN MG ELEMENTAL CALCIUM) 500 MG chewable tablet Chew 0.5 tablets by mouth as needed for indigestion or heartburn.   Yes [provider]  Emollient (GOLD BOND ULTIMATE HEALING) CREA Apply 1 application topically daily as needed (wound care).   Yes [provider]  Flaxseed, Linseed, (FLAX SEED OIL) 1000 MG CAPS Take 1,000 mg by mouth 2 (two) times daily.   Yes [provider]  ibuprofen (ADVIL,MOTRIN) 200 MG tablet Take  200-400 mg by mouth 2 (two) times daily as needed for headache or moderate pain.   Yes [provider]  isosorbide mononitrate (IMDUR) 120 MG 24 hr tablet Take 1 tablet (120 mg total) by mouth daily. 10/17/16  Yes Rollene Rotunda, MD  isosorbide mononitrate (IMDUR) 60 MG 24 hr tablet Take 1 tablet (60 mg total) by mouth daily. 07/04/17 10/02/17 Yes Rollene Rotunda, MD  loperamide (IMODIUM A-D) 2 MG tablet Take 2-4 mg by mouth 2 (two) times daily as needed for diarrhea or loose stools.   Yes [provider]  metoprolol tartrate (LOPRESSOR) 25 MG tablet TAKE 0.5 TABLETS BY MOUTH 2 TIMES DAILY 07/29/17  Yes Rollene Rotunda, MD  NASONEX 50 MCG/ACT nasal spray Place 2 sprays into the nose daily as needed (allergies).  12/29/13  Yes [provider]  nitroGLYCERIN (NITROSTAT) 0.4 MG SL tablet Place 1 tablet (0.4 mg total) under the tongue every 5 (five) minutes as needed for chest pain. 09/26/17 12/25/17 Yes Barrett, Joline Salt, PA-C  Omega-3 Fatty Acids (FISH OIL PO) Take 2 capsules by mouth daily.   Yes [provider]  omeprazole (PRILOSEC) 40 MG capsule TAKE ONE CAPSULE BY MOUTH DAILY 04/30/17  Yes Nafziger, Kandee Keen, NP  Probiotic CAPS Take 1 capsule by mouth daily.   Yes [provider]  ramipril (ALTACE) 5 MG capsule TAKE ONE CAPSULE BY MOUTH DAILY 06/25/17  Yes Nafziger, Kandee Keen, NP  RANEXA 500 MG 12 hr tablet TAKE 1 TABLET (500 MG TOTAL) BY MOUTH 2 (TWO) TIMES DAILY. 07/16/17  Yes Hochrein, Fayrene Fearing, MD  rosuvastatin (CRESTOR) 5 MG tablet TAKE 1 TABLET BY MOUTH ON MONDAY, WEDNESDAY, AND FRIDAY 08/26/17  Yes Hochrein, Fayrene Fearing, MD  timolol (BETIMOL) 0.5 % ophthalmic solution Place 1 drop into both eyes 2 (two) times daily.   Yes [provider]  Turmeric Curcumin 500 MG CAPS Take 500 mg by mouth 2 (two) times daily.   Yes [provider]  warfarin (COUMADIN) 4 MG tablet TAKE AS DIRECTED BY ANTICOAGULATION CLINIC Patient taking differently: Take 2-4 mg by mouth  See admin instructions. Take 4 mg once daily in the evening except Thursday take 2 mg once daily in the evening 06/18/17   Shirline Frees, NP    Current Facility-Administered Medications  Medication Dose Route Frequency Provider Last Rate Last Dose  . 0.9 %  sodium chloride  infusion   Intravenous Continuous Lennette Bihari, MD 50 mL/hr at 10/03/17 [phone]    . 0.9 %  sodium chloride infusion  250 mL Intravenous PRN Lennette Bihari, MD      . acetaminophen (TYLENOL) tablet 650 mg  650 mg Oral Q4H PRN Lennette Bihari, MD      . amLODipine (NORVASC) tablet 5 mg  5 mg Oral Daily Lennette Bihari, MD   5 mg at 10/03/17 0959  . aspirin chewable tablet 81 mg  81 mg Oral Daily Lennette Bihari, MD   81 mg at 10/03/17 9604  . atorvastatin (LIPITOR) tablet 80 mg  80 mg Oral q1800 Lennette Bihari, MD   80 mg at 10/03/17 1741  . diazepam (VALIUM) tablet 5 mg  5 mg Oral Q6H PRN Lennette Bihari, MD      . heparin ADULT infusion 100 units/mL (25000 units/262mL sodium chloride 0.45%)  1,350 Units/hr Intravenous Continuous Bajbus, Lauren D, RPH 13.5 mL/hr at 10/03/17 1209 1,350 Units/hr at 10/03/17 1209  . isosorbide mononitrate (IMDUR) 24 hr tablet 120 mg  120 mg Oral Daily Lennette Bihari, MD   120 mg at 10/03/17 0959  . metoprolol tartrate (LOPRESSOR) tablet 25 mg  25 mg Oral BID Lennette Bihari, MD   25 mg at 10/03/17 0959  . ondansetron (ZOFRAN) injection 4 mg  4 mg Intravenous Q6H PRN Lennette Bihari, MD      . pantoprazole (PROTONIX) EC tablet 40 mg  40 mg Oral Daily Lennette Bihari, MD   40 mg at 10/03/17 0959  . sodium chloride flush (NS) 0.9 % injection 3 mL  3 mL Intravenous Q12H Lennette Bihari, MD   3 mL at 10/02/17 2111  . sodium chloride flush (NS) 0.9 % injection 3 mL  3 mL Intravenous PRN Lennette Bihari, MD      . thrombin recombinant (RECOTHROM) solution syringe 20,000 Units  20,000 Units Topical Once Lennette Bihari, MD        Allergies  Allergen Reactions  . Sulfa Antibiotics Other (See  Comments)    Makes patient jittery, prefers not to take them.       Review of Systems:   General:  normal appetite, decreased energy, no weight gain, no weight loss, no fever  Cardiac:  + chest pain with exertion, no chest pain at rest, + SOB with exertion, no resting SOB, no PND, no orthopnea, no palpitations, no arrhythmia, no atrial fibrillation, no LE edema, + occasional mild dizzy spells, no syncope  Respiratory:  + exertional shortness of breath, no home oxygen, no productive cough, no dry cough, no bronchitis, no wheezing, no hemoptysis, no asthma, no pain with inspiration or cough, no sleep apnea, no CPAP at night  GI:   no difficulty swallowing, no reflux, no frequent heartburn, + hiatal hernia, no abdominal pain, + constipation, no diarrhea, no hematochezia, no hematemesis, no melena  GU:   no dysuria,  no frequency, no urinary tract infection, no hematuria, no enlarged prostate, no kidney stones, no kidney disease  Vascular:  no pain suggestive of claudication, no pain in feet, no leg cramps, no varicose veins, + remote h/o DVT, no non-healing foot ulcer  Neuro:   no stroke, no TIA's, no seizures, no headaches, no temporary blindness one eye,  no slurred speech, no peripheral neuropathy, no chronic pain, very mild instability of gait, no memory/cognitive dysfunction  Musculoskeletal: + arthritis - primarily involving the right knee,  no joint swelling, no myalgias, no difficulty walking, near normal mobility   Skin:   no rash, no itching, no skin infections, no pressure sores or ulcerations  Psych:   no anxiety, no depression, no nervousness, no unusual recent stress  Eyes:   + blurry vision, no floaters, no recent vision changes, + wears glasses or contacts  ENT:   no hearing loss, no loose or painful teeth, no dentures, last saw dentist within the past year  Hematologic:  + easy bruising, no abnormal bleeding, + clotting disorder, no frequent epistaxis  Endocrine:  no diabetes, does  not check CBG's at home     Physical Exam:   BP 102/68 (BP Location: Left Arm)   Pulse 66   Temp 98.5 F (36.9 C) (Oral)   Resp 13   Ht 5\' 9"  (1.753 m)   Wt 170 lb (77.1 kg)   SpO2 95%   BMI 25.10 kg/m   General:  Thin,  well-appearing  HEENT:  Unremarkable   Neck:   no JVD, no bruits, no adenopathy   Chest:   clear to auscultation, symmetrical breath sounds, no wheezes, no rhonchi   CV:   RRR, grade III/VI systolic murmur   Abdomen:  soft, non-tender, no masses   Extremities:  warm, well-perfused, pulses diminished but palpable, no lower extremity edema  Rectal/GU  Deferred  Neuro:   Grossly non-focal and symmetrical throughout  Skin:   Clean and dry, no rashes, no breakdown  Diagnostic Tests:  Transthoracic Echocardiography  Patient:    [name], [name] MR #:       [id number] Study Date: 07/18/2017 Gender:     M Age:        5 Height:     172.7 cm Weight:     76.5 kg BSA:        1.93 m^2 Pt. Status: Room:   ATTENDING    Rollene Rotunda, MD  ORDERING     Rollene Rotunda, MD  REFERRING    Rollene Rotunda, MD  SONOGRAPHER  Aida Raider, RDCS  PERFORMING   [facility], Outpatient  cc:  ------------------------------------------------------------------- LV EF: 55% -   60%  ------------------------------------------------------------------- Indications:      I35.9 Aortic Valve Disorder.  ------------------------------------------------------------------- History:   PMH:  Acquired from the patient and from the patient&'s chart.  PMH:  Coronary artery disease. End Stage Renal Disease. Aortic Stenosis.  Risk factors:  Hypertension. Dyslipidemia.  ------------------------------------------------------------------- Study Conclusions  - Left ventricle: The cavity size was normal. Wall thickness was   increased in a pattern of mild LVH. Systolic function was normal.   The estimated ejection fraction was in the range of 55% to 60%.   Wall motion was normal; there  were no regional wall motion   abnormalities. Left ventricular diastolic function parameters   were normal. - Aortic valve: Valve mobility was restricted. There was moderate   to severe stenosis. There was trivial regurgitation. - Mitral valve: There was mild regurgitation. - Left atrium: The atrium was moderately dilated. - Right ventricle: The cavity size was mildly dilated. - Right atrium: The atrium was mildly dilated. - Tricuspid valve: There was mild-moderate regurgitation.  Impressions:  - Normal LV systolic function; mild LVH; calcified aortic valve   with moderate to severe AS (mean gradient 38 mmHg) and trace AI;   mild MR; modlerate LAE; mild RAE and RVE; mild to moderate TR.  ------------------------------------------------------------------- Study data:   Study status:  Routine.  Procedure:  The patient reported  no pain pre or post test. Transthoracic echocardiography for left ventricular function evaluation, for right ventricular function evaluation, and for assessment of valvular function. Image quality was adequate.  Study completion:  There were no complications.          Transthoracic echocardiography.  M-mode, complete 2D, spectral Doppler, and color Doppler.  Birthdate: Patient birthdate: [birth year].  Age:  Patient is 66 yr old.  Sex: Gender: male.    BMI: 25.6 kg/m^2.  Blood pressure:     140/90 Patient status:  Outpatient.  Study date:  Study date: 07/18/2017. Study time: 10:41 AM.  Location:  [place] [facility]  -------------------------------------------------------------------  ------------------------------------------------------------------- Left ventricle:  The cavity size was normal. Wall thickness was increased in a pattern of mild LVH. Systolic function was normal. The estimated ejection fraction was in the range of 55% to 60%. Wall motion was normal; there were no regional wall motion abnormalities. Left ventricular diastolic function  parameters were normal.  ------------------------------------------------------------------- Aortic valve:   Trileaflet; moderately calcified leaflets. Valve mobility was restricted.  Doppler:   There was moderate to severe stenosis.   There was trivial regurgitation.    VTI ratio of LVOT to aortic valve: 0.19. Valve area (VTI): 0.93 cm^2. Indexed valve area (VTI): 0.48 cm^2/m^2. Peak velocity ratio of LVOT to aortic valve: 0.18. Valve area (Vmax): 0.91 cm^2. Indexed valve area (Vmax): 0.47 cm^2/m^2. Mean velocity ratio of LVOT to aortic valve: 0.19. Valve area (Vmean): 0.94 cm^2. Indexed valve area (Vmean): 0.49 cm^2/m^2.    Mean gradient (S): 38 mm Hg. Peak gradient (S): 54 mm Hg.  ------------------------------------------------------------------- Aorta:  Aortic root: The aortic root was normal in size.  ------------------------------------------------------------------- Mitral valve:   Mildly thickened leaflets . Mobility was not restricted.  Doppler:  Transvalvular velocity was within the normal range. There was no evidence for stenosis. There was mild regurgitation.    Peak gradient (D): 3 mm Hg.  ------------------------------------------------------------------- Left atrium:  The atrium was moderately dilated.  ------------------------------------------------------------------- Right ventricle:  The cavity size was mildly dilated. Systolic function was normal.  ------------------------------------------------------------------- Pulmonic valve:    Doppler:  Transvalvular velocity was within the normal range. There was no evidence for stenosis. There was trivial regurgitation.  ------------------------------------------------------------------- Tricuspid valve:   Structurally normal valve.    Doppler: Transvalvular velocity was within the normal range. There was mild-moderate  regurgitation.  ------------------------------------------------------------------- Right atrium:  The atrium was mildly dilated.  ------------------------------------------------------------------- Pericardium:  There was no pericardial effusion.  ------------------------------------------------------------------- Systemic veins: Inferior vena cava: The vessel was normal in size.  ------------------------------------------------------------------- Measurements   Left ventricle                            Value          Reference  LV ID, ED, PLAX chordal                   47.2  mm       43 - 52  LV ID, ES, PLAX chordal                   33.2  mm       23 - 38  LV fx shortening, PLAX chordal            30    %        >=29  LV PW thickness, ED  13.2  mm       ---------  IVS/LV PW ratio, ED                       0.93           <=1.3  Stroke volume, 2D                         82    ml       ---------  Stroke volume/bsa, 2D                     43    ml/m^2   ---------  LV e&', lateral                            11.5  cm/s     ---------  LV E/e&', lateral                          7.04           ---------  LV e&', medial                             9.98  cm/s     ---------  LV E/e&', medial                           8.12           ---------  LV e&', average                            10.74 cm/s     ---------  LV E/e&', average                          7.54           ---------    Ventricular septum                        Value          Reference  IVS thickness, ED                         12.3  mm       ---------    LVOT                                      Value          Reference  LVOT ID, S                                25    mm       ---------  LVOT area                                 4.91  cm^2     ---------  LVOT ID  25    mm       ---------  LVOT peak velocity, S                     67.6  cm/s     ---------  LVOT mean  velocity, S                     47.3  cm/s     ---------  LVOT VTI, S                               16.7  cm       ---------  LVOT peak gradient, S                     2     mm Hg    ---------  Stroke volume (SV), LVOT DP               82    ml       ---------  Stroke index (SV/bsa), LVOT DP            42.5  ml/m^2   ---------    Aortic valve                              Value          Reference  Aortic valve peak velocity, S             366   cm/s     ---------  Aortic valve mean velocity, S             247   cm/s     ---------  Aortic valve VTI, S                       87.9  cm       ---------  Aortic mean gradient, S                   38    mm Hg    ---------  Aortic peak gradient, S                   54    mm Hg    ---------  VTI ratio, LVOT/AV                        0.19           ---------  Aortic valve area, VTI                    0.93  cm^2     ---------  Aortic valve area/bsa, VTI                0.48  cm^2/m^2 ---------  Velocity ratio, peak, LVOT/AV             0.18           ---------  Aortic valve area, peak velocity          0.91  cm^2     ---------  Aortic valve area/bsa, peak               0.47  cm^2/m^2 ---------  velocity  Velocity ratio, mean, LVOT/AV  0.19           ---------  Aortic valve area, mean velocity          0.94  cm^2     ---------  Aortic valve area/bsa, mean               0.49  cm^2/m^2 ---------  velocity  Aortic regurg pressure half-time          674   ms       ---------    Aorta                                     Value          Reference  Aortic root ID, ED                        34    mm       ---------  Ascending aorta ID, A-P, S                36    mm       ---------    Left atrium                               Value          Reference  LA ID, A-P, ES                            50    mm       ---------  LA ID/bsa, A-P                    (H)     2.6   cm/m^2   <=2.2  LA volume, S                              88    ml       ---------   LA volume/bsa, S                          45.7  ml/m^2   ---------  LA volume, ES, 1-p A4C                    60    ml       ---------  LA volume/bsa, ES, 1-p A4C                31.1  ml/m^2   ---------  LA volume, ES, 1-p A2C                    109   ml       ---------  LA volume/bsa, ES, 1-p A2C                56.6  ml/m^2   ---------    Mitral valve                              Value          Reference  Mitral E-wave peak velocity  81    cm/s     ---------  Mitral A-wave peak velocity               62.1  cm/s     ---------  Mitral deceleration time          (H)     254   ms       150 - 230  Mitral peak gradient, D                   3     mm Hg    ---------  Mitral E/A ratio, peak                    1.3            ---------    Tricuspid valve                           Value          Reference  Tricuspid regurg peak velocity            242   cm/s     ---------  Tricuspid peak RV-RA gradient             23    mm Hg    ---------    Right ventricle                           Value          Reference  RV s&', lateral, S                         10.7  cm/s     ---------  Legend: (L)  and  (H)  mark values outside specified reference range.  ------------------------------------------------------------------- Prepared and Electronically Authenticated by  Olga Millers 2018-10-04T11:42:14    RIGHT HEART CATH AND CORONARY ANGIOGRAPHY  Conclusion     Ost RCA to Prox RCA lesion is 10% stenosed.  Mid RCA lesion is 95% stenosed.  Ost Cx lesion is 90% stenosed.  Prox Cx lesion is 15% stenosed.  Prox Cx to Mid Cx lesion is 15% stenosed.  Previously placed Ost 1st Mrg to 1st Mrg stent (unknown type) is widely patent.  Ost LAD to Prox LAD lesion is 80% stenosed.  Prox LAD lesion is 15% stenosed.  Mid LAD lesion is 80% stenosed.  Mid Cx to Dist Cx lesion is 50% stenosed.   Severe multivessel CAD with diffuse 80% ostial LAD stenosis before the previously placed  LAD stent and diffuse 80% mid LAD stenosis beyond the stented segment; 80% calcified ostial left circumflex stenosis with patent proximal circumflex stent beyond the ostium and patent bifurcation stents in the OM vessel and mid AV groove circumflex with 50% distal circumflex stenosis; patent proximal RCA stent, but with new 95% irregular eccentric plaque in the mid RCA.  Severely reduced aortic valve excursion suggestive of severe aortic stenosis with recent echo in October 2018 showing a mean gradient of 38 and peak gradient of 54 mmHg.  Mildly dilated aortic root.  RECOMMENDATION: Surgical consultation will be obtained for CABG revascularization surgery and aortic valve replacement.  The patient apparently has a coagulation disorder  with lupus and had been on Coumadin which was stopped for the catheterization.  He will be heparinized 8 hours following sheath removal.   Indications  Coronary artery disease involving native coronary artery of native heart with unstable angina pectoris (HCC) [I25.110 (ICD-10-CM)]  Nonrheumatic aortic valve stenosis [I35.0 (ICD-10-CM)]  Procedural Details/Technique   Technical Details [name] is a 66 year old Caucasian male who has significant history for coronary artery disease and has undergone multiple stents to his LAD, proximal circumflex, mid circumflex, OM, and RCA. His last cardiac catheterization was in 2016 by Dr. Herbie BaltimoreHarding. There was diffuse disease and medical therapy was recommended. He has been found to have aortic stenosis with an echo Doppler study in October 2018 showed a mean gradient of 38 and peak gradient of 54 with normal LV function. He recently developed progressive exertional chest pain with unstable anginal symptoms. He was seen by Theodore Demarkhonda Barrett and was referred for repeat cardiac catheterization.  With the patient's history of aortic stenosis, a right and left heart cardiac catheterization was performed. He had very poor pulses  in the radial artery. The procedure was done through the right femoral approach. The right frontal arteries function anteriorly and a 6 French sheath was inserted without difficulty. A 7 French venous sheath was inserted in the right femoral vein. Swan-Ganz catheter was advanced into the venous sheath and pressures were recorded in the RA, RV, PA, and wedge positions. PA saturation was obtained. Cardiac output was obtained by the thermodilution and Fick method. Diagnostic catheterization in the coronary arteries was done with 5 [place] JL 5 and JR4 catheters. Several attempts were made to cross the aortic valve with the right catheter unsuccessfully. The catheter was then removed and a pigtail catheter was inserted for supravalvular aortography. With the demonstration of significantly reduced aortic valve excursion, as well as the severe multivessel CAD and need for CABG revascularization surgery with AVR and with the valve data obtained from recent echo Doppler study, no further attempt was made at crossing the aortic valve. All catheters were removed from the patient. The patient tolerated the procedure well.   Estimated blood loss <50 mL.  During this procedure the patient was administered the following to achieve and maintain moderate conscious sedation: Versed 2 mg, Fentanyl 25 mcg, while the patient's heart rate, blood pressure, and oxygen saturation were continuously monitored. The period of conscious sedation was 60 minutes, of which I was present face-to-face 100% of this time.  Coronary Findings   Diagnostic  Dominance: Right  Left Anterior Descending  Ost LAD to Prox LAD lesion 80% stenosed  Ost LAD to Prox LAD lesion is 80% stenosed. The lesion is eccentric.  Prox LAD lesion 15% stenosed  Prox LAD lesion is 15% stenosed. The lesion was previously treated.  Mid LAD lesion 80% stenosed  Mid LAD lesion is 80% stenosed.  Left Circumflex  Ost Cx lesion 90% stenosed  Ost Cx lesion is 90%  stenosed. The lesion is calcified.  Prox Cx lesion 15% stenosed  Prox Cx lesion is 15% stenosed. The lesion was previously treated.  Prox Cx to Mid Cx lesion 15% stenosed  Prox Cx to Mid Cx lesion is 15% stenosed. The lesion was previously treated.  Mid Cx to Dist Cx lesion 50% stenosed  Mid Cx to Dist Cx lesion is 50% stenosed.  First Obtuse Marginal Branch  Ost 1st Mrg to 1st Mrg lesion 0% stenosed  Previously placed Ost 1st Mrg to 1st Mrg stent (unknown type) is widely patent.  Right Coronary Artery  Ost RCA to Prox RCA lesion 10% stenosed  Ost RCA to Prox RCA lesion is 10% stenosed. The lesion  was previously treated.  Mid RCA lesion 95% stenosed  Mid RCA lesion is 95% stenosed. The lesion is eccentric and irregular.  Intervention   No interventions have been documented.  Right Heart   Right Heart Pressures RA: A wave 9, V wave 9, mean 8. RV: 36/9 PA: 31/13; mean 2 PW: A wave 13, V wave 16, mean 12.  Central aortic pressure initially 104/52.  Simultaneous AO 112/59, and PA 29/8  Oxygen saturation in the aorta. 98% and in the PA 73%.  Cardiac output by the thermodilution method 6.6, and by the Fick method 7.1 L/m.  Cardiac index by the thermodilution method 3.4 and by the Fick method 3.7 L/m/m.  Left Heart   Aorta Aortic Root: Supravalvular aortography demonstrated a mildly dilated aortic root. The aortic valve was significantly calcified and there was severely reduced aortic valve excursion.  Coronary Diagrams   Diagnostic Diagram       Implants     No implant documentation for this case.  MERGE Images   Show images for CARDIAC CATHETERIZATION   Link to Procedure Log   Procedure Log    Hemo Data    Most Recent Value  Fick Cardiac Output 7.12 L/min  Fick Cardiac Output Index 3.69 (L/min)/BSA  Thermal Cardiac Output 6.57 L/min  Thermal Cardiac Output Index 3.4 (L/min)/BSA  RA A Wave 9 mmHg  RA V Wave 9 mmHg  RA Mean 8 mmHg  RV Systolic Pressure 36  mmHg  RV Diastolic Pressure 2 mmHg  RV EDP 9 mmHg  PA Systolic Pressure 29 mmHg  PA Diastolic Pressure 8 mmHg  PA Mean 18 mmHg  PW A Wave 13 mmHg  PW V Wave 16 mmHg  PW Mean 12 mmHg  AO Systolic Pressure 112 mmHg  AO Diastolic Pressure 59 mmHg  AO Mean 80 mmHg  TPVR Index 5.88 HRUI  TSVR Index 21.15 HRUI  PVR SVR Ratio 0.13  TPVR/TSVR Ratio 0.28       Impression:  Patient has stage D severe symptomatic aortic stenosis and severe multivessel coronary artery disease.  He presents with accelerating symptoms of exertional angina and shortness of breath, functional class III.  I have personally reviewed the patient's most recent follow-up transthoracic echocardiogram and the diagnostic cardiac catheterization performed October 02, 2017.  Echocardiogram reveals severe aortic stenosis with normal left ventricular systolic function.  The aortic valve is trileaflet.  There is severe thickening, calcification, and restricted leaflet mobility involving all 3 leaflets of the aortic valve.  Peak velocity across the aortic valve ranged as high as 4.0 m/s corresponding to mean transvalvular gradient estimated 38 mmHg.  The DVI was reported 0.19.  Diagnostic cardiac catheterization revealed severe multivessel coronary artery disease as outlined previously.  There is also significant calcification in the ascending thoracic aorta.  I agree the patient would best be treated with aortic valve replacement and coronary artery bypass grafting.  Risks associated with conventional surgery will be somewhat elevated because of the patient's numerous comorbid medical problems, and potentially by disease involving the ascending thoracic aorta.  Preoperative CT angiography might be helpful.    Plan:  The patient was counseled at length regarding treatment alternatives for management of severe aortic stenosis and coronary artery disease including continued medical therapy versus proceeding with aortic valve  replacement and coronary artery bypass grafting in the near future.   All questions answered.  Patient desires to proceed with surgery in the near future.    At the next step the  patient will need to undergo definitive management of his right femoral artery pseudoaneurysm.  We will obtain cardiac gated CT angiogram of the heart to further evaluate the functional anatomy of the aortic valve and aortic root as well as the severity of atherosclerosis and calcification involving the aortic root and ascending thoracic aorta.  Routine pulmonary function testing and carotid duplex scans will be performed.  If the patient is to remain in the hospital between now and the time of surgery it is possible that 1 of my partners could proceed with aortic valve replacement and coronary artery bypass grafting towards the end of next week.  If the patient is to be discharged from the hospital and return for surgery I would be happy to schedule him for elective aortic valve replacement and coronary artery bypass grafting on Thursday, October 17, 2017.  We will continue to follow while he remains in the hospital and make final plans as soon as feasible.   I spent in excess of 120 minutes during the conduct of this hospital consultation and >50% of this time involved direct face-to-face encounter for counseling and/or coordination of the patient's care.    Salvatore Decent. Cornelius Moras, MD 10/03/2017 5:53 PM

## 2017-10-03 NOTE — Progress Notes (Signed)
ANTICOAGULATION CONSULT NOTE  Pharmacy Consult for Heparin Indication: CAD (pending CABG eval), hx DVT/PE, lupus  Allergies  Allergen Reactions  . Sulfa Antibiotics Other (See Comments)    Makes patient jittery, prefers not to take them.     Patient Measurements: Height: 5\' 9"  (175.3 cm) Weight: 170 lb (77.1 kg) IBW/kg (Calculated) : 70.7 Heparin Dosing Weight: 77.1 kg  Vital Signs: Temp: 98.5 F (36.9 C) (12/20 1538) Temp Source: Oral (12/20 1538) BP: 102/68 (12/20 1538) Pulse Rate: 66 (12/20 1221)  Labs: Recent Labs    10/02/17 1246 10/03/17 0419 10/03/17 0956 10/03/17 1700  HGB 10.6* 10.3* 11.3*  --   HCT 33.1* 31.9* 34.3*  --   PLT 170 171 174  --   LABPROT 14.0  --   --   --   INR 1.09  --   --   --   HEPARINUNFRC  --   --  0.17* 0.23*  CREATININE 0.82 0.86  --   --     Estimated Creatinine Clearance: 84.5 mL/min (by C-G formula based on SCr of 0.86 mg/dL).   Medical History: Past Medical History:  Diagnosis Date  . Anemia   . AS (aortic stenosis)    Mild  . Barrett's esophagus 11/11/2014  . CAD S/P percutaneous coronary angioplasty    7 stents per the patient (no record).  Stent to 90% inferior branch of an OM and mild scattered plaque in other vessels [facility]  01/08/13.  Cath 2016 with patent stents. He did have 70% stenosis in a small LAD that was about 2 mm. Size  . Coronary artery disease involving coronary bypass graft of native heart with angina pectoris (HCC) 06/27/2017  . Essential hypertension   . GERD (gastroesophageal reflux disease)   . Glaucoma   . Heart murmur   . Hypercoagulable state (HCC)   . Hyperlipidemia   . Joint pain   . Lupus   . Pneumonitis    Lupus  . Severe aortic stenosis   . Unstable angina (HCC) 09/27/2017  . Uses hearing aid     Medications:  Medications Prior to Admission  Medication Sig Dispense Refill Last Dose  . amLODipine (NORVASC) 5 MG tablet TAKE ONE TABLET BY MOUTH DAILY (Patient taking  differently: Take 5mg  by mouth every evening) 90 tablet 6 10/01/2017 at 1800  . aspirin EC 325 MG tablet Take 162.5 mg by mouth daily. May take an additional 162.5 mg as needed for chest tightness   10/02/2017 at 0600  . Azelastine HCl 0.15 % SOLN USE 2 SPRAYS BY NASAL ROUTE AS NEEDED FOR CONGESTION   Past Week at Unknown time  . brimonidine (ALPHAGAN) 0.15 % ophthalmic solution Place 1 drop into both eyes 2 (two) times daily.   10/02/2017 at 0600  . calcium carbonate (TUMS - DOSED IN MG ELEMENTAL CALCIUM) 500 MG chewable tablet Chew 0.5 tablets by mouth as needed for indigestion or heartburn.   Past Week at Unknown time  . Emollient (GOLD BOND ULTIMATE HEALING) CREA Apply 1 application topically daily as needed (wound care).   Past Week at Unknown time  . Flaxseed, Linseed, (FLAX SEED OIL) 1000 MG CAPS Take 1,000 mg by mouth 2 (two) times daily.   10/01/2017 at Unknown time  . ibuprofen (ADVIL,MOTRIN) 200 MG tablet Take 200-400 mg by mouth 2 (two) times daily as needed for headache or moderate pain.   Past Week at Unknown time  . isosorbide mononitrate (IMDUR) 120 MG 24 hr tablet Take  1 tablet (120 mg total) by mouth daily. 90 tablet 3 10/02/2017 at 0600  . isosorbide mononitrate (IMDUR) 60 MG 24 hr tablet Take 1 tablet (60 mg total) by mouth daily. 90 tablet 1 10/02/2017 at 0600  . loperamide (IMODIUM A-D) 2 MG tablet Take 2-4 mg by mouth 2 (two) times daily as needed for diarrhea or loose stools.   Past Month at Unknown time  . metoprolol tartrate (LOPRESSOR) 25 MG tablet TAKE 0.5 TABLETS BY MOUTH 2 TIMES DAILY 60 tablet 12 10/02/2017 at 0600  . NASONEX 50 MCG/ACT nasal spray Place 2 sprays into the nose daily as needed (allergies).    10/02/2017 at 0600  . nitroGLYCERIN (NITROSTAT) 0.4 MG SL tablet Place 1 tablet (0.4 mg total) under the tongue every 5 (five) minutes as needed for chest pain. 25 tablet 3 09/18/2017  . Omega-3 Fatty Acids (FISH OIL PO) Take 2 capsules by mouth daily.   10/01/2017 at  Unknown time  . omeprazole (PRILOSEC) 40 MG capsule TAKE ONE CAPSULE BY MOUTH DAILY 90 capsule 1 10/02/2017 at 0600  . Probiotic CAPS Take 1 capsule by mouth daily.   10/01/2017 at Unknown time  . ramipril (ALTACE) 5 MG capsule TAKE ONE CAPSULE BY MOUTH DAILY 90 capsule 2 10/02/2017 at 0600  . RANEXA 500 MG 12 hr tablet TAKE 1 TABLET (500 MG TOTAL) BY MOUTH 2 (TWO) TIMES DAILY. 60 tablet 10 10/02/2017 at 0600  . rosuvastatin (CRESTOR) 5 MG tablet TAKE 1 TABLET BY MOUTH ON MONDAY, WEDNESDAY, AND FRIDAY 36 tablet 3 Past Week at Unknown time  . timolol (BETIMOL) 0.5 % ophthalmic solution Place 1 drop into both eyes 2 (two) times daily.   10/02/2017 at 0600  . Turmeric Curcumin 500 MG CAPS Take 500 mg by mouth 2 (two) times daily.   10/01/2017 at Unknown time  . warfarin (COUMADIN) 4 MG tablet TAKE AS DIRECTED BY ANTICOAGULATION CLINIC (Patient taking differently: Take 2-4 mg by mouth See admin instructions. Take 4 mg once daily in the evening except Thursday take 2 mg once daily in the evening) 90 tablet 1 09/26/2017 at 1900    Assessment: 66 yo M on warfarin PTA for hx lupus, DVT/PE with hx CAD presents 12/19 for scheduled cardiac cath due to UA. Found to have multivessel CAD and aortic stenosis. Pt has been evaluated by [facility] and CABG + AVR surgery recommended.  Heparin remains subtherapeutic following rate increase.  Noted plans to hold heparin 12/21 at 0200 for pseudoaneurysm repair.  Therefor will not repeat level at this time.  Goal of Therapy:  Heparin level 0.3-0.7 units/ml Monitor platelets by anticoagulation protocol: Yes   Plan:  Increase heparin infusion to 1550 units/hr Heparin off at 0200 12/21 Follow-up after procedure for timing of heparin restart Monitor heparin level/CBC daily, s/sx bleeding  Toys 'R' UsKimberly Devanta Daniel, Pharm.D., BCPS Clinical Pharmacist 10/03/2017 7:23 PM

## 2017-10-03 NOTE — Progress Notes (Signed)
   Progress Note  Patient Name: [name] Date of Encounter: 10/03/2017  Primary Cardiologist: Dr Antoine PocheHochrein  Subjective   Mild chest discomfort with ambulation; no dyspnea  Inpatient Medications    Scheduled Meds: . amLODipine  5 mg Oral Daily  . aspirin  81 mg Oral Daily  . atorvastatin  80 mg Oral q1800  . isosorbide mononitrate  120 mg Oral Daily  . metoprolol tartrate  25 mg Oral BID  . pantoprazole  40 mg Oral Daily  . sodium chloride flush  3 mL Intravenous Q12H   Continuous Infusions: . sodium chloride 50 mL/hr at 10/03/17 0436  . sodium chloride    . heparin 1,100 Units/hr (10/03/17 0303)   PRN Meds: sodium chloride, acetaminophen, diazepam, ondansetron (ZOFRAN) IV, sodium chloride flush   Vital Signs    Vitals:   10/02/17 2108 10/02/17 2334 10/03/17 0306 10/03/17 0745  BP: 130/77 113/69 113/75 125/78  Pulse:  70 70 71  Resp:  13 14 19   Temp:  97.6 F (36.4 C) 97.8 F (36.6 C) 98 F (36.7 C)  TempSrc:  Oral Oral Oral  SpO2:  96% 95% 97%  Weight:      Height:        Intake/Output Summary (Last 24 hours) at 10/03/2017 0754 Last data filed at 10/03/2017 0751 Gross per 24 hour  Intake 584.6 ml  Output 1500 ml  Net -915.4 ml   Filed Weights   10/02/17 1147  Weight: 170 lb (77.1 kg)    Telemetry    Sinus- Personally Reviewed   Physical Exam   GEN: No acute distress.   Neck: No JVD Cardiac: RRR, 3/6 systolic murmur Respiratory: Clear to auscultation bilaterally. GI: Soft, nontender, non-distended, right groin with bruit MS: No edema Neuro:  Nonfocal  Psych: Normal affect   Labs    Chemistry Recent Labs  Lab 10/02/17 1246 10/03/17 0419  NA 132* 137  K 4.1 4.0  CL 101 105  CO2 24 25  GLUCOSE 87 84  BUN 14 12  CREATININE 0.82 0.86  CALCIUM 8.7* 8.3*  GFRNONAA >60 >60  GFRAA >60 >60  ANIONGAP 7 7     Hematology Recent Labs  Lab 10/02/17 1246 10/03/17 0419  WBC 5.8 5.8  RBC 3.98* 3.77*  HGB 10.6* 10.3*  HCT 33.1*  31.9*  MCV 83.2 84.6  MCH 26.6 27.3  MCHC 32.0 32.3  RDW 16.1* 16.5*  PLT 170 171     Patient Profile     66 y.o. male admitted with UA; cath with 3 vessel CAD; previous echo with moderate to severe AS.   Assessment & Plan    1 coronary artery disease-cardiac catheterization revealed severe three-vessel coronary disease.  Continue aspirin, heparin, statin and beta-blocker.  Plan is surgical consult for coronary artery bypass and graft.  2 moderate to severe aortic stenosis-recent echocardiogram showed mean gradient 38 mmHg.  The patient will need aortic valve replacement at time of CABG.  3 right femoral bruit-status post catheterization.  Will plan ultrasound to exclude pseudoaneurysm or AV fistula.  4 history of DVT and lupus-Coumadin was held for cardiac catheterization.  We will continue heparin for now.  Resume anticoagulation following surgery.  5 hyperlipidemia-continue statin.  6 hypertension-blood pressure is controlled.  Continue present medications.  For questions or updates, please contact [facility] Please consult [email or web address] for contact info under Cardiology/STEMI.      Signed, Olga MillersBrian Cato Liburd, MD  10/03/2017, 7:54 AM

## 2017-10-03 NOTE — Progress Notes (Signed)
VASCULAR LAB PRELIMINARY  PRELIMINARY  PRELIMINARY  PRELIMINARY  Right groin ultrasound to rule out pseudoaneurysm completed.    Preliminary report:  There is a pseudoaneurysm noted in the right groin measuring 1.7cm X 1.4cm with a neck measuring  0.45cm.  Kaytelynn Scripter, RVT 10/03/2017, 9:48 AM

## 2017-10-04 ENCOUNTER — Inpatient Hospital Stay (HOSPITAL_COMMUNITY): Payer: PPO

## 2017-10-04 ENCOUNTER — Other Ambulatory Visit: Payer: Self-pay

## 2017-10-04 ENCOUNTER — Encounter (HOSPITAL_COMMUNITY): Payer: PPO

## 2017-10-04 DIAGNOSIS — M79609 Pain in unspecified limb: Secondary | ICD-10-CM

## 2017-10-04 DIAGNOSIS — I25709 Atherosclerosis of coronary artery bypass graft(s), unspecified, with unspecified angina pectoris: Secondary | ICD-10-CM

## 2017-10-04 DIAGNOSIS — I729 Aneurysm of unspecified site: Secondary | ICD-10-CM | POA: Diagnosis not present

## 2017-10-04 DIAGNOSIS — Z01810 Encounter for preprocedural cardiovascular examination: Secondary | ICD-10-CM

## 2017-10-04 DIAGNOSIS — I350 Nonrheumatic aortic (valve) stenosis: Secondary | ICD-10-CM

## 2017-10-04 DIAGNOSIS — I2510 Atherosclerotic heart disease of native coronary artery without angina pectoris: Secondary | ICD-10-CM

## 2017-10-04 LAB — HEMOGLOBIN A1C
HEMOGLOBIN A1C: 6.3 % — AB (ref 4.8–5.6)
Hgb A1c MFr Bld: 6.1 % — ABNORMAL HIGH (ref 4.8–5.6)
Mean Plasma Glucose: 128.37 mg/dL
Mean Plasma Glucose: 134.11 mg/dL

## 2017-10-04 LAB — COMPREHENSIVE METABOLIC PANEL
ALBUMIN: 4 g/dL (ref 3.5–5.0)
ALK PHOS: 30 U/L — AB (ref 38–126)
ALK PHOS: 31 U/L — AB (ref 38–126)
ALT: 31 U/L (ref 17–63)
ALT: 33 U/L (ref 17–63)
ANION GAP: 7 (ref 5–15)
AST: 31 U/L (ref 15–41)
AST: 34 U/L (ref 15–41)
Albumin: 3.4 g/dL — ABNORMAL LOW (ref 3.5–5.0)
Anion gap: 6 (ref 5–15)
BILIRUBIN TOTAL: 0.6 mg/dL (ref 0.3–1.2)
BUN: 12 mg/dL (ref 6–20)
BUN: 14 mg/dL (ref 6–20)
CALCIUM: 8.5 mg/dL — AB (ref 8.9–10.3)
CO2: 26 mmol/L (ref 22–32)
CO2: 24 mmol/L (ref 22–32)
CREATININE: 0.83 mg/dL (ref 0.61–1.24)
CREATININE: 0.87 mg/dL (ref 0.61–1.24)
Calcium: 8.6 mg/dL — ABNORMAL LOW (ref 8.9–10.3)
Chloride: 106 mmol/L (ref 101–111)
Chloride: 105 mmol/L (ref 101–111)
GFR calc Af Amer: >60 mL/min (ref >60)
GFR calc Af Amer: >60 mL/min (ref >60)
GFR calc non Af Amer: >60 mL/min (ref >60)
Glucose, Bld: 92 mg/dL (ref 65–99)
Glucose, Bld: 95 mg/dL (ref 65–99)
POTASSIUM: 4 mmol/L (ref 3.5–5.1)
Potassium: 3.7 mmol/L (ref 3.5–5.1)
SODIUM: 136 mmol/L (ref 135–145)
Sodium: 138 mmol/L (ref 135–145)
TOTAL PROTEIN: 7 g/dL (ref 6.5–8.1)
TOTAL PROTEIN: 7.9 g/dL (ref 6.5–8.1)
Total Bilirubin: 0.8 mg/dL (ref 0.3–1.2)

## 2017-10-04 LAB — BLOOD GAS, ARTERIAL
Acid-Base Excess: 0.3 mmol/L (ref 0.0–2.0)
Acid-base deficit: 0.5 mmol/L (ref 0.0–2.0)
BICARBONATE: 24.3 mmol/L (ref 20.0–28.0)
BICARBONATE: 23.5 mmol/L (ref 20.0–28.0)
Drawn by: 414221
Drawn by: 277551
FIO2: 0.21
FIO2: 0.21
O2 SAT: 94.4 %
O2 Saturation: 92.9 %
PATIENT TEMPERATURE: 98.6
PATIENT TEMPERATURE: 98.6
PCO2 ART: 37.8 mmHg (ref 32.0–48.0)
PO2 ART: 70.1 mmHg — AB (ref 83.0–108.0)
pCO2 arterial: 37.6 mmHg (ref 32.0–48.0)
pH, Arterial: 7.423 (ref 7.350–7.450)
pH, Arterial: 7.412 (ref 7.350–7.450)
pO2, Arterial: 78.6 mmHg — ABNORMAL LOW (ref 83.0–108.0)

## 2017-10-04 LAB — CBC
HCT: 32.3 % — ABNORMAL LOW (ref 39.0–52.0)
HCT: 36.1 % — ABNORMAL LOW (ref 39.0–52.0)
HEMOGLOBIN: 11.9 g/dL — AB (ref 13.0–17.0)
Hemoglobin: 10.4 g/dL — ABNORMAL LOW (ref 13.0–17.0)
MCH: 27.2 pg (ref 26.0–34.0)
MCH: 27.5 pg (ref 26.0–34.0)
MCHC: 32.2 g/dL (ref 30.0–36.0)
MCHC: 33 g/dL (ref 30.0–36.0)
MCV: 84.6 fL (ref 78.0–100.0)
MCV: 83.6 fL (ref 78.0–100.0)
PLATELETS: 167 10*3/uL (ref 150–400)
PLATELETS: 170 10*3/uL (ref 150–400)
RBC: 3.82 MIL/uL — AB (ref 4.22–5.81)
RBC: 4.32 MIL/uL (ref 4.22–5.81)
RDW: 16.5 % — AB (ref 11.5–15.5)
RDW: 16.2 % — ABNORMAL HIGH (ref 11.5–15.5)
WBC: 7.1 10*3/uL (ref 4.0–10.5)
WBC: 7.2 10*3/uL (ref 4.0–10.5)

## 2017-10-04 LAB — PREALBUMIN: PREALBUMIN: 18.8 mg/dL (ref 18–38)

## 2017-10-04 LAB — APTT: APTT: 30 s (ref 24–36)

## 2017-10-04 LAB — LIPID PANEL
CHOL/HDL RATIO: 3.6 ratio
Cholesterol: 76 mg/dL (ref 0–200)
HDL: 21 mg/dL — ABNORMAL LOW (ref >40)
LDL CALC: 40 mg/dL (ref 0–99)
Triglycerides: 77 mg/dL (ref <150)
VLDL: 15 mg/dL (ref 0–40)

## 2017-10-04 LAB — TYPE AND SCREEN
ABO/RH(D): B POS
Antibody Screen: NEGATIVE

## 2017-10-04 LAB — ABO/RH: ABO/RH(D): B POS

## 2017-10-04 LAB — PROTIME-INR
INR: 1.1
Prothrombin Time: 14.1 seconds (ref 11.4–15.2)

## 2017-10-04 MED ORDER — METOPROLOL TARTRATE 12.5 MG HALF TABLET
12.5000 mg | ORAL_TABLET | Freq: Two times a day (BID) | ORAL | Status: DC
  Administered 2017-10-04 – 2017-10-05 (×3): 12.5 mg via ORAL
  Filled 2017-10-04 (×3): qty 1

## 2017-10-04 MED ORDER — ATORVASTATIN CALCIUM 80 MG PO TABS: 80.0000 mg | ORAL_TABLET | Freq: Every day | ORAL | 3 refills | Status: DC

## 2017-10-04 MED ORDER — ENOXAPARIN SODIUM 120 MG/0.8ML ~~LOC~~ SOLN
120.0000 mg | SUBCUTANEOUS | Status: DC
  Filled 2017-10-04: qty 0.8

## 2017-10-04 MED ORDER — ENOXAPARIN (LOVENOX) PATIENT EDUCATION KIT: 1.0000 | PACK | Freq: Once | 0 refills | Status: AC

## 2017-10-04 MED ORDER — ASPIRIN 81 MG PO CHEW
81.0000 mg | CHEWABLE_TABLET | Freq: Every day | ORAL | Status: AC
Start: 2017-10-05 — End: ?

## 2017-10-04 MED ORDER — ACETAMINOPHEN 325 MG PO TABS
650.0000 mg | ORAL_TABLET | ORAL | Status: AC | PRN
Start: 2017-10-04 — End: ?

## 2017-10-04 MED ORDER — CHLORHEXIDINE GLUCONATE 0.12 % MT SOLN: 15.0000 mL | Freq: Once | OROMUCOSAL | Status: DC

## 2017-10-04 MED ORDER — MAGNESIUM SULFATE 2 GM/50ML IV SOLN: 2.0000 g | Freq: Once | INTRAVENOUS | Status: DC

## 2017-10-04 MED ORDER — METOPROLOL TARTRATE 12.5 MG HALF TABLET: 12.5000 mg | ORAL_TABLET | Freq: Once | ORAL | Status: DC

## 2017-10-04 MED ORDER — LOPERAMIDE HCL 2 MG PO CAPS
4.0000 mg | ORAL_CAPSULE | Freq: Four times a day (QID) | ORAL | Status: DC | PRN
  Administered 2017-10-04: 4 mg via ORAL
  Filled 2017-10-04: qty 2

## 2017-10-04 MED ORDER — ENOXAPARIN SODIUM 120 MG/0.8ML ~~LOC~~ SOLN: 120.0000 mg | SUBCUTANEOUS | 0 refills | Status: DC

## 2017-10-04 MED ORDER — PANTOPRAZOLE SODIUM 40 MG PO TBEC: 40.0000 mg | DELAYED_RELEASE_TABLET | Freq: Every day | ORAL | 3 refills | Status: AC

## 2017-10-04 MED ORDER — IOPAMIDOL (ISOVUE-370) INJECTION 76%
INTRAVENOUS | Status: AC
  Administered 2017-10-04: 80 mL
  Filled 2017-10-04: qty 100

## 2017-10-04 MED ORDER — ENOXAPARIN (LOVENOX) PATIENT EDUCATION KIT
PACK | Freq: Once | Status: AC
  Administered 2017-10-04: 16:00:00
  Filled 2017-10-04 (×2): qty 1

## 2017-10-04 MED ORDER — LIDOCAINE HCL (PF) 1 % IJ SOLN
INTRAMUSCULAR | Status: AC
  Administered 2017-10-04: 12:00:00
  Filled 2017-10-04: qty 5

## 2017-10-04 MED ORDER — CHLORHEXIDINE GLUCONATE 4 % EX LIQD: 30.0000 mL | CUTANEOUS | Status: DC

## 2017-10-04 MED ORDER — THROMBIN (RECOMBINANT) 5000 UNITS EX SOLR
CUTANEOUS | Status: AC
  Filled 2017-10-04: qty 5000

## 2017-10-04 MED FILL — Thrombin (Recombinant) For Soln 20000 Unit: CUTANEOUS | Qty: 1 | Status: AC

## 2017-10-04 NOTE — Care Management Note (Addendum)
Case Management Note  Patient Details  Name: [name] MRN: [id number] Date of Birth: [birth year]  Subjective/Objective:   Pt is s/p cardiac cath - needs CABG                 Action/Plan:   PTA independent from home with wife.  Pt will discharge home on Lovenox   Expected Discharge Date:  10/04/17               Expected Discharge Plan:  Home/Self Care  In-House Referral:     Discharge planning Services  CM Consult  Post Acute Care Choice:    Choice offered to:     DME Arranged:    DME Agency:     HH Arranged:    HH Agency:     Status of Service:     If discussed at [employer] of [employer] Meetings, dates discussed:    Additional Comments: Per Benefit check;  Lovenox is not on formulary and will require prior auth [id number].,  Enoxaparin is covered without prior auth copay is $84.00.  [facility] - however [facility] [address] has [facility] and  can fill as ordered.  CM informed NP to give pt paper script and order generic   Cherylann ParrClaxton, Bryce Kimble S, RN 10/04/2017, 4:25 PM

## 2017-10-04 NOTE — Plan of Care (Signed)
Pt resting; family updated; denies pain aside from headache; A&Ox4; will continue to monitor condition.   [place]  Akeria Hedstrom, RN

## 2017-10-04 NOTE — Progress Notes (Signed)
  Progress Note    10/04/2017 8:39 AM 2 Days Post-Op  Subjective:  No new complaints  Vitals:   10/04/17 0455 10/04/17 0749  BP: 110/69 118/86  Pulse: 62 67  Resp: 17 17  Temp: 98.1 F (36.7 C) 98.4 F (36.9 C)  SpO2: 92% 94%    Physical Exam: aaox3 Right groin 3+ pulse with psa possibly increased in size from yesteray Palpable right popliteal pulse  CBC    Component Value Date/Time   WBC 7.1 10/04/2017 0306   RBC 3.82 (L) 10/04/2017 0306   HGB 10.4 (L) 10/04/2017 0306   HCT 32.3 (L) 10/04/2017 0306   PLT 167 10/04/2017 0306   MCV 84.6 10/04/2017 0306   MCH 27.2 10/04/2017 0306   MCHC 32.2 10/04/2017 0306   RDW 16.5 (H) 10/04/2017 0306   LYMPHSABS 1.9 10/26/2016 0854   MONOABS 0.6 10/26/2016 0854   EOSABS 0.1 10/26/2016 0854   BASOSABS 0.0 10/26/2016 0854    BMET    Component Value Date/Time   NA 138 10/04/2017 0306   K 4.0 10/04/2017 0306   CL 106 10/04/2017 0306   CO2 26 10/04/2017 0306   GLUCOSE 92 10/04/2017 0306   BUN 12 10/04/2017 0306   CREATININE 0.83 10/04/2017 0306   CREATININE 0.73 02/28/2016 0838   CALCIUM 8.5 (L) 10/04/2017 0306   GFRNONAA >60 10/04/2017 0306   GFRNONAA >89 02/28/2016 0838   GFRAA >60 10/04/2017 0306   GFRAA >89 02/28/2016 0838    INR    Component Value Date/Time   INR 1.09 10/02/2017 1246     Intake/Output Summary (Last 24 hours) at 10/04/2017 0839 Last data filed at 10/04/2017 0457 Gross per 24 hour  Intake 2921.27 ml  Output 1350 ml  Net 1571.27 ml     Assessment:  66 y.o. male is s/p cardiac cath with right femoral psa  Plan: Heparin is off. Dr. Darrick PennaFields will attempt thrombin injection today.   Siomara Burkel C. Randie Heinzain, MD [facility] Office: [phone] Pager: [phone]  10/04/2017 8:39 AM

## 2017-10-04 NOTE — Progress Notes (Signed)
Clarifying d/c vs CABG orders with MD.   [place]  Cayce Quezada, RN

## 2017-10-04 NOTE — Progress Notes (Signed)
When I went to discharge the pt he was in the bathroom. He apparently was fine, ate lunch, then suddenly had nausea and abdominal cramping and diarrhea. Around the same time on telemetry he had 2:1 AVB with VR of 28 but I can't tell if he had bradycardia and then got sick, or if he got sick then bradycardic. After discussion with the pt and family I suggested we watch him overnight with plans to send him home tomorrow as long as he doesn't have anymore episodes. Continue current medications including low dose Lopressor.   Corine ShelterLUKE Keonta Monceaux PA-C 10/04/2017 4:06 PM

## 2017-10-04 NOTE — Progress Notes (Signed)
Pt up in room. Discussed sternal precautions, IS (gave and pt only at 750 mL), mobility, and d/c planning after surgery. Wife present, she can be with him after surgery. Encouraged more IS this week. Gave [facility] booklet and careguide. Voiced understanding. [phone] Ethelda ChickKristan Ravi Keith CES, ACSM 3:11 PM 10/04/2017

## 2017-10-04 NOTE — Progress Notes (Signed)
RT performed ABG on room air.

## 2017-10-04 NOTE — Op Note (Signed)
Procedure: thrombin injection right femoral artery pseudoaneurysm Preop: right femoral pseudoaneurysm Post op same  Procedure: 4 cc 1% lidocaine right groin.  Under US guidance CFA and pseudo identified.  1 cc thrombin injected.  Complete thrombosis of pseudo.  Pt will get follow up study in 2 hours.  Should be ok for d/c home if follow up study shows thrombosis.  Ok to eat Keep in bed until follow up study  Fabienne Brunsharles Kyriaki Moder, MD [facility] Office: [phone] Pager: [phone]

## 2017-10-04 NOTE — Progress Notes (Signed)
ANTICOAGULATION CONSULT NOTE  Pharmacy Consult for Heparin Indication: ACS pending CABG, hx DVT/PE, lupus  Allergies  Allergen Reactions  . Sulfa Antibiotics Other (See Comments)    Makes patient jittery, prefers not to take them.     Patient Measurements: Height: 5\' 9"  (175.3 cm) Weight: 170 lb (77.1 kg) IBW/kg (Calculated) : 70.7 Heparin Dosing Weight: 77.1 kg  Vital Signs: Temp: 98.4 F (36.9 C) (12/21 0749) Temp Source: Oral (12/21 0749) BP: 118/86 (12/21 0749) Pulse Rate: 67 (12/21 0749)  Labs: Recent Labs    10/02/17 1246 10/03/17 0419 10/03/17 0956 10/03/17 1700 10/04/17 0306  HGB 10.6* 10.3* 11.3*  --  10.4*  HCT 33.1* 31.9* 34.3*  --  32.3*  PLT 170 171 174  --  167  LABPROT 14.0  --   --   --   --   INR 1.09  --   --   --   --   HEPARINUNFRC  --   --  0.17* 0.23*  --   CREATININE 0.82 0.86  --   --  0.83    Estimated Creatinine Clearance: 87.5 mL/min (by C-G formula based on SCr of 0.83 mg/dL).   Medical History: Past Medical History:  Diagnosis Date  . Anemia   . AS (aortic stenosis)    Mild  . Barrett's esophagus 11/11/2014  . CAD S/P percutaneous coronary angioplasty    7 stents per the patient (no record).  Stent to 90% inferior branch of an OM and mild scattered plaque in other vessels [facility]  01/08/13.  Cath 2016 with patent stents. He did have 70% stenosis in a small LAD that was about 2 mm. Size  . Coronary artery disease involving coronary bypass graft of native heart with angina pectoris (HCC) 06/27/2017  . Essential hypertension   . GERD (gastroesophageal reflux disease)   . Glaucoma   . Heart murmur   . Hypercoagulable state (HCC)   . Hyperlipidemia   . Joint pain   . Lupus   . Pneumonitis    Lupus  . Severe aortic stenosis   . Unstable angina (HCC) 09/27/2017  . Uses hearing aid     Medications:  Medications Prior to Admission  Medication Sig Dispense Refill Last Dose  . amLODipine (NORVASC) 5 MG tablet TAKE ONE  TABLET BY MOUTH DAILY (Patient taking differently: Take 5mg  by mouth every evening) 90 tablet 6 10/01/2017 at 1800  . aspirin EC 325 MG tablet Take 162.5 mg by mouth daily. May take an additional 162.5 mg as needed for chest tightness   10/02/2017 at 0600  . Azelastine HCl 0.15 % SOLN USE 2 SPRAYS BY NASAL ROUTE AS NEEDED FOR CONGESTION   Past Week at Unknown time  . brimonidine (ALPHAGAN) 0.15 % ophthalmic solution Place 1 drop into both eyes 2 (two) times daily.   10/02/2017 at 0600  . calcium carbonate (TUMS - DOSED IN MG ELEMENTAL CALCIUM) 500 MG chewable tablet Chew 0.5 tablets by mouth as needed for indigestion or heartburn.   Past Week at Unknown time  . Emollient (GOLD BOND ULTIMATE HEALING) CREA Apply 1 application topically daily as needed (wound care).   Past Week at Unknown time  . Flaxseed, Linseed, (FLAX SEED OIL) 1000 MG CAPS Take 1,000 mg by mouth 2 (two) times daily.   10/01/2017 at Unknown time  . ibuprofen (ADVIL,MOTRIN) 200 MG tablet Take 200-400 mg by mouth 2 (two) times daily as needed for headache or moderate pain.   Past Week  at Unknown time  . isosorbide mononitrate (IMDUR) 120 MG 24 hr tablet Take 1 tablet (120 mg total) by mouth daily. 90 tablet 3 10/02/2017 at 0600  . isosorbide mononitrate (IMDUR) 60 MG 24 hr tablet Take 1 tablet (60 mg total) by mouth daily. 90 tablet 1 10/02/2017 at 0600  . loperamide (IMODIUM A-D) 2 MG tablet Take 2-4 mg by mouth 2 (two) times daily as needed for diarrhea or loose stools.   Past Month at Unknown time  . metoprolol tartrate (LOPRESSOR) 25 MG tablet TAKE 0.5 TABLETS BY MOUTH 2 TIMES DAILY 60 tablet 12 10/02/2017 at 0600  . NASONEX 50 MCG/ACT nasal spray Place 2 sprays into the nose daily as needed (allergies).    10/02/2017 at 0600  . nitroGLYCERIN (NITROSTAT) 0.4 MG SL tablet Place 1 tablet (0.4 mg total) under the tongue every 5 (five) minutes as needed for chest pain. 25 tablet 3 09/18/2017  . Omega-3 Fatty Acids (FISH OIL PO) Take 2  capsules by mouth daily.   10/01/2017 at Unknown time  . omeprazole (PRILOSEC) 40 MG capsule TAKE ONE CAPSULE BY MOUTH DAILY 90 capsule 1 10/02/2017 at 0600  . Probiotic CAPS Take 1 capsule by mouth daily.   10/01/2017 at Unknown time  . ramipril (ALTACE) 5 MG capsule TAKE ONE CAPSULE BY MOUTH DAILY 90 capsule 2 10/02/2017 at 0600  . RANEXA 500 MG 12 hr tablet TAKE 1 TABLET (500 MG TOTAL) BY MOUTH 2 (TWO) TIMES DAILY. 60 tablet 10 10/02/2017 at 0600  . rosuvastatin (CRESTOR) 5 MG tablet TAKE 1 TABLET BY MOUTH ON MONDAY, WEDNESDAY, AND FRIDAY 36 tablet 3 Past Week at Unknown time  . timolol (BETIMOL) 0.5 % ophthalmic solution Place 1 drop into both eyes 2 (two) times daily.   10/02/2017 at 0600  . Turmeric Curcumin 500 MG CAPS Take 500 mg by mouth 2 (two) times daily.   10/01/2017 at Unknown time  . warfarin (COUMADIN) 4 MG tablet TAKE AS DIRECTED BY ANTICOAGULATION CLINIC (Patient taking differently: Take 2-4 mg by mouth See admin instructions. Take 4 mg once daily in the evening except Thursday take 2 mg once daily in the evening) 90 tablet 1 09/26/2017 at 1900    Assessment: 66 yo M on warfarin PTA for hx lupus, DVT/PE with hx CAD presents 12/19 for scheduled cardiac cath due to UA. Found to have multivessel CAD and aortic stenosis. Heparin placed on hold at 0200 overnight per Vascular for bedside thrombin injection today. Patient to be discharged today on Lovenox through Jan 2nd, with CVTS planning for CABG + AVR for Jan 3rd.   Goal of Therapy:  Heparin level 0.3-0.7 units/ml Monitor platelets by anticoagulation protocol: Yes   Plan:  -Heparin on hold for thrombin injection of pseudoaneurysm -To discharge on Lovenox through 10/16/17 prior to CABG/AVR on 10/17/17. To resume warfarin after procedure completed -Monitor CBC, s/sx bleeding  Babs BertinHaley Sascha Palma, PharmD, BCPS Clinical Pharmacist Clinical phone for 10/04/2017 until 3:30pm: [phone] If after 3:30pm, please call main pharmacy at:  [phone] 10/04/2017 8:43 AM

## 2017-10-04 NOTE — Progress Notes (Signed)
Pre-op Cardiac Surgery  Carotid Findings: Patent with 1-39% stenosis in bilateral carotid arteries.   Upper Extremity Right Left  Brachial Pressures 134 IV   Radial Waveforms triphasic triphasic  Ulnar Waveforms triphasic   Palmar Arch (Allen's Test) abnormal abnormal   Findings:  Bilateral radial arteries obliterated  with compression. Bilateral ulnar arteries appear within normal limits with compression. Marilynne Halstedita Kyair Ditommaso, BS, RDMS, RVT

## 2017-10-04 NOTE — Progress Notes (Signed)
[address]       [place] [id number]             [phone]     CARDIOTHORACIC SURGERY PROGRESS NOTE  2 Days Post-Op  S/P Procedure(s) (LRB): RIGHT HEART CATH AND CORONARY ANGIOGRAPHY (N/A) Thoracic Aorta Angiogram  Subjective: No complaints.  No chest pain.  Objective: Vital signs in last 24 hours: Temp:  [97.6 F (36.4 C)-98.7 F (37.1 C)] 98.7 F (37.1 C) (12/21 1243) Pulse Rate:  [62-78] 70 (12/21 1052) Cardiac Rhythm: Heart block;Normal sinus rhythm (12/21 0820) Resp:  [13-21] 16 (12/21 1243) BP: (102-135)/(61-86) 116/61 (12/21 1243) SpO2:  [92 %-95 %] 94 % (12/21 0749)  Physical Exam:  Rhythm:   sinus  Breath sounds: clear  Heart sounds:  RRR w/ systolic murmur  Incisions:  R groin stable  Abdomen:  Soft, non-distended, non-tender  Extremities:  Warm, well-perfused    Intake/Output from previous day: 12/20 0701 - 12/21 0700 In: 2921.3 [P.O.:600; I.V.:2321.3] Out: 1550 [Urine:1550] Intake/Output this shift: Total I/O In: 303 [P.O.:300; I.V.:3] Out: -   Lab Results: Recent Labs    10/03/17 0956 10/04/17 0306  WBC 6.1 7.1  HGB 11.3* 10.4*  HCT 34.3* 32.3*  PLT 174 167   BMET:  Recent Labs    10/03/17 0419 10/04/17 0306  NA 137 138  K 4.0 4.0  CL 105 106  CO2 25 26  GLUCOSE 84 92  BUN 12 12  CREATININE 0.86 0.83  CALCIUM 8.3* 8.5*    CBG (last 3)  No results for input(s): GLUCAP in the last 72 hours. PT/INR:   Recent Labs    10/02/17 1246  LABPROT 14.0  INR 1.09    CXR:  CHEST  2 VIEW  COMPARISON:  Radiographs of July 09, 2015.  FINDINGS: Stable cardiomediastinal silhouette. No pneumothorax is noted. Left lung is clear. Mild right basilar atelectasis is noted with probable minimal right pleural effusion. Bony thorax is unremarkable.  IMPRESSION: Mild right basilar subsegmental atelectasis with minimal right pleural effusion.   Electronically Signed   By: Lupita RaiderJames  Green Jr, M.D.   On:  10/04/2017 08:59  Cardiac TAVR CT  TECHNIQUE: The patient was scanned on a [employer] scanner. A 120 kV retrospective scan was triggered in the descending thoracic aorta at 111 HU's. Gantry rotation speed was 250 msecs and collimation was .6 mm. No beta blockade or nitro were given. The 3D data set was reconstructed in 5% intervals of the R-R cycle. Systolic and diastolic phases were analyzed on a dedicated work station using MPR, MIP and VRT modes. The patient received 80 cc of contrast.  FINDINGS: Aortic Valve: Trileaflet aortic valve with moderately thickened and calcified aortic valve with moderately restricted valve opening. No calcifications extending into the LVOT.  Aorta: Normal caliber with mild diffuse calcifications and no dissection.  Sinotubular Junction:  33 x 30 mm  Ascending Thoracic Aorta:  38 x 37 mm  Aortic Arch:  29 x 27 mm  Descending Thoracic Aorta:  25 x 25 mm  Sinus of Valsalva Measurements:  Non-coronary:  32 mm  Right -coronary:  33 mm  Left -coronary:  35 mm  Coronary Artery Height above Annulus:  Left Main:  17 mm  Right Coronary:  16 mm  Virtual Basal Annulus Measurements:  Maximum/Minimum Diameter:  30 x 25 mm  Perimeter:  88.5 mm  Area:  593 mm2  Coronary Arteries: Normal origin. Severe almost circumferential calcifications of  all of the coronary arteries.  IMPRESSION: 1. Trileaflet aortic valve with moderately thickened and calcified aortic valve with moderately restricted valve opening. No calcifications extending into the LVOT.  2. Thoracic aorta has normal caliber with mild diffuse calcifications and no dissection.  3. Coronary Arteries: Normal origin. Severe almost circumferential calcifications of all of the coronary arteries.  4.  There is no ASD/VSD.  5.  There is no thrombus in the left atrial appendage.  6.  Normal size of the pulmonary artery.   Electronically Signed   By:  Tobias AlexanderKatarina  Nelson   On: 10/04/2017 12:00    Assessment/Plan: S/P Procedure(s) (LRB): RIGHT HEART CATH AND CORONARY ANGIOGRAPHY (N/A) Thoracic Aorta Angiogram  Patient reportedly underwent successful thrombin injection of right femoral artery pseudoaneursym earlier today.  He remains clinically stable and free of chest pain.  I have personally reviewed his CT angiogram and discussed findings with the patient.  We plan AVR + CABG on October 17, 2017.  The patient was again counseled at length regarding treatment alternatives for management of severe aortic stenosis and coronary artery disease.  The indications, risks and potential benefits of surgery were reviewed at length.  Expectations regarding his postoperative convalescence were discussed.  Discussion was held comparing the relative risks of mechanical valve replacement with need for lifelong anticoagulation versus use of a bioprosthetic tissue valve and the associated potential for late structural valve deterioration and failure.  This discussion was placed in the context of the patient's particular circumstances, and as a result the patient specifically requests that their valve be replaced using a bioprosthetic tissue valve.  The patient understands and accepts all potential associated risks of surgery including but not limited to risk of death, stroke, myocardial infarction, congestive heart failure, respiratory failure, renal failure, pneumonia, bleeding requiring blood transfusion and or reexploration, arrhythmia, heart block or bradycardia requiring permanent pacemaker, aortic dissection or other major vascular complication, pleural effusions or other delayed complications related to continued congestive heart failure, late recurrence of symptomatic ischemic heart disease, and other late complications related to valve replacement including structural valve deterioration and failure, thrombosis, endocarditis, or paravalvular leak.  All questions  answered.   I spent in excess of 15 minutes during the conduct of this hospital encounter and >50% of this time involved direct face-to-face encounter with the patient for counseling and/or coordination of their care.   Purcell Nailslarence H Owen, MD 10/04/2017 1:59 PM

## 2017-10-04 NOTE — Addendum Note (Signed)
Addended by: Lorain ChildesPRESSLEY, Trequan Marsolek A on: 10/04/2017 08:04 AM   Modules accepted: Orders

## 2017-10-04 NOTE — Discharge Summary (Signed)
Discharge Summary    Patient ID: [name],  MRN: [id number], DOB/AGE: Jun 28, 1951 66 y.o.  Admit date: 10/02/2017 Discharge date: 10/04/2017  Primary Care Provider: Dorothyann Peng Primary Cardiologist: Dr Percival Spanish  Discharge Diagnoses    Principal Problem:   Angina, class III ([place]) Active Problems:   Lupus anticoagulant with hypercoagulable state ([place])   CAD S/P percutaneous coronary angioplasty: reports 7 stents   Long term (current) use of anticoagulants   Severe aortic stenosis   Pseudoaneurysm ([facility])   Lung involvement in systemic lupus erythematosus ([place])   Essential hypertension   Dyslipidemia   Allergies Allergies  Allergen Reactions  . Sulfa Antibiotics Other (See Comments)    Makes patient jittery, prefers not to take them.     Diagnostic Studies/Procedures    Rt and Lt heart cath 10/02/17 Vascualr ultrasound of LE 10/02/17 and 10/03/17 _____________   History of Present Illness     66 y/o with know CAD, admitted for diagnostic rt and Lt cath secondary to chest pain  Hospital Course     Consultants: Dr Roxy Manns 10/02/17                       Dr Donzetta Matters 10/02/17  [name] is a 66 y/o male with a history of CAD, s/p multiple stents in 2014 at [facility]. He moved to [place] after this and has been followed her. He was seen in the office 09/27/17 with complaints of accelerating exertional angina. He also has known moderate to severe AS and Lupus anticoagulant with a history of PE and DVT. He is on chronic Coumadin. His Coumadin was held and he was admitted for Rt and Lt heart cath 10/02/17. This revealed severe progression of CAD in all three major arteries and his OM (his previously placed pRCA, CFX x 2, OM1, and LAD stents were all patent). The pt was placed on IV Heparin post cath and seen in consult by Dr Roxy Manns who recommended CABG and AVR. Unfortunately the pt developed a pseudoaneurysm post cath. He was seen on consult by Dr Donzetta Matters and this was  injected by Dr Eden Lathe on 10/03/17. F/U doppler 2 hrs post confirmed successful closure.The plan is to have the pt start Lovenox full dose tomorrow pm and continue till after his Jan 1st pm dose- his surgery is scheduled for Jan 3d.  _____________  Discharge Vitals Blood pressure 116/61, pulse 70, temperature 98.7 F (37.1 C), temperature source Oral, resp. rate 16, height 5' 9"  (1.753 m), weight 170 lb (77.1 kg), SpO2 94 %.  Filed Weights   10/02/17 1147  Weight: 170 lb (77.1 kg)    Labs & Radiologic Studies    CBC Recent Labs    10/03/17 0956 10/04/17 0306  WBC 6.1 7.1  HGB 11.3* 10.4*  HCT 34.3* 32.3*  MCV 84.1 84.6  PLT 174 929   Basic Metabolic Panel Recent Labs    10/03/17 0419 10/04/17 0306  NA 137 138  K 4.0 4.0  CL 105 106  CO2 25 26  GLUCOSE 84 92  BUN 12 12  CREATININE 0.86 0.83  CALCIUM 8.3* 8.5*   Liver Function Tests Recent Labs    10/04/17 0306  AST 31  ALT 31  ALKPHOS 30*  BILITOT 0.6  PROT 7.0  ALBUMIN 3.4*   No results for input(s): LIPASE, AMYLASE in the last 72 hours. Cardiac Enzymes No results for input(s): CKTOTAL, CKMB, CKMBINDEX, TROPONINI in the last 72 hours. BNP  Invalid input(s): POCBNP D-Dimer No results for input(s): DDIMER in the last 72 hours. Hemoglobin A1C Recent Labs    10/04/17 0306  HGBA1C 6.1*   Fasting Lipid Panel Recent Labs    10/04/17 0306  CHOL 76  HDL 21*  LDLCALC 40  TRIG 77  CHOLHDL 3.6   Thyroid Function Tests No results for input(s): TSH, T4TOTAL, T3FREE, THYROIDAB in the last 72 hours.  Invalid input(s): FREET3 _____________  Dg Chest 2 View  Result Date: 10/04/2017 CLINICAL DATA:  Hypertension.  Chest pain. EXAM: CHEST  2 VIEW COMPARISON:  Radiographs of July 09, 2015. FINDINGS: Stable cardiomediastinal silhouette. No pneumothorax is noted. Left lung is clear. Mild right basilar atelectasis is noted with probable minimal right pleural effusion. Bony thorax is unremarkable. IMPRESSION:  Mild right basilar subsegmental atelectasis with minimal right pleural effusion. Electronically Signed   By: Marijo Conception, M.D.   On: 10/04/2017 08:59   Ct Coronary Morph W/cta Cor W/score W/ca W/cm &/or Wo/cm  Addendum Date: 10/04/2017   ADDENDUM REPORT: 10/04/2017 12:00 CLINICAL DATA:  66 year old male with three vessel coronary artery disease and moderate to severe aortic stenosis scheduled for a CABG and AVR. EXAM: Cardiac TAVR CT TECHNIQUE: The patient was scanned on a [employer]. A 120 kV retrospective scan was triggered in the descending thoracic aorta at 111 HU's. Gantry rotation speed was 250 msecs and collimation was .6 mm. No beta blockade or nitro were given. The 3D data set was reconstructed in 5% intervals of the R-R cycle. Systolic and diastolic phases were analyzed on a dedicated work station using MPR, MIP and VRT modes. The patient received 80 cc of contrast. FINDINGS: Aortic Valve: Trileaflet aortic valve with moderately thickened and calcified aortic valve with moderately restricted valve opening. No calcifications extending into the LVOT. Aorta: Normal caliber with mild diffuse calcifications and no dissection. Sinotubular Junction:  33 x 30 mm Ascending Thoracic Aorta:  38 x 37 mm Aortic Arch:  29 x 27 mm Descending Thoracic Aorta:  25 x 25 mm Sinus of Valsalva Measurements: Non-coronary:  32 mm Right -coronary:  33 mm Left -coronary:  35 mm Coronary Artery Height above Annulus: Left Main:  17 mm Right Coronary:  16 mm Virtual Basal Annulus Measurements: Maximum/Minimum Diameter:  30 x 25 mm Perimeter:  88.5 mm Area:  593 mm2 Coronary Arteries: Normal origin. Severe almost circumferential calcifications of all of the coronary arteries. IMPRESSION: 1. Trileaflet aortic valve with moderately thickened and calcified aortic valve with moderately restricted valve opening. No calcifications extending into the LVOT. 2. Thoracic aorta has normal caliber with mild diffuse  calcifications and no dissection. 3. Coronary Arteries: Normal origin. Severe almost circumferential calcifications of all of the coronary arteries. 4.  There is no ASD/VSD. 5.  There is no thrombus in the left atrial appendage. 6.  Normal size of the pulmonary artery. Electronically Signed   By: Ena Dawley   On: 10/04/2017 12:00   Result Date: 10/04/2017 EXAM: OVER-READ INTERPRETATION CT CHEST The following report is an over-read performed by radiologist Dr. Evangeline Dakin of [facility], [place] on 10/04/2017. This over-read does not include interpretation of cardiac or coronary anatomy or pathology. The coronary calcium score/coronary CTA interpretation by the cardiologist is attached. COMPARISON:  None. FINDINGS: Vascular: Atherosclerosis involving the ascending thoracic aorta well without evidence of aneurysm, maximum diameter 3.8 cm. There are scattered foci of calcifications involving the pericardium. Mediastinum/Nodes: No pathologic lymphadenopathy within the visualized mediastinum. Visualized esophagus normal  in appearance. Lungs/Pleura: Emphysematous changes throughout the visualized lungs. Mild atelectasis involving the right lower lobe. No confluent airspace consolidation. No pleural effusions. Central airways patent without significant bronchial wall thickening. Calcified tracheobronchial cartilages. Upper Abdomen: Unremarkable for the early arterial phase of enhancement. Musculoskeletal: Degenerative disc disease and spondylosis involving the visualized mid and thoracic spine, worst at T9-10. Degenerative changes involving the sternoclavicular joints. No acute osseous abnormalities. IMPRESSION: 1. Scattered foci of pericardial calcification, likely the sequela of prior pericarditis. Does the patient have restrictive cardiac symptoms? 2.  Aortic Atherosclerosis (ICD10-170.0). 3. Emphysema (ICD10-J43.9). Electronically Signed: By: Evangeline Dakin M.D. On: 10/04/2017 10:02   Disposition     Pt is being discharged home today in good condition.  Follow-up Plans & Appointments    Follow-up Information    Rexene Alberts, MD Follow up.   Specialty:  Cardiothoracic Surgery Why:  office will contact you Contact information: [address] [place] [address] [phone]            Discharge Medications   Allergies as of 10/04/2017      Reactions   Sulfa Antibiotics Other (See Comments)   Makes patient jittery, prefers not to take them.       Medication List    STOP taking these medications   aspirin EC 325 MG tablet Replaced by:  aspirin 81 MG chewable tablet   omeprazole 40 MG capsule Commonly known as:  PRILOSEC Replaced by:  pantoprazole 40 MG tablet   RANEXA 500 MG 12 hr tablet Generic drug:  ranolazine   rosuvastatin 5 MG tablet Commonly known as:  CRESTOR   warfarin 4 MG tablet Commonly known as:  COUMADIN     TAKE these medications   acetaminophen 325 MG tablet Commonly known as:  TYLENOL Take 2 tablets (650 mg total) by mouth every 4 (four) hours as needed for headache or mild pain.   amLODipine 5 MG tablet Commonly known as:  NORVASC TAKE ONE TABLET BY MOUTH DAILY What changed:    how much to take  how to take this  when to take this   aspirin 81 MG chewable tablet Chew 1 tablet (81 mg total) by mouth daily. Start taking on:  10/05/2017 Replaces:  aspirin EC 325 MG tablet   atorvastatin 80 MG tablet Commonly known as:  LIPITOR Take 1 tablet (80 mg total) by mouth daily at 6 PM.   Azelastine HCl 0.15 % Soln USE 2 SPRAYS BY NASAL ROUTE AS NEEDED FOR CONGESTION   brimonidine 0.15 % ophthalmic solution Commonly known as:  ALPHAGAN Place 1 drop into both eyes 2 (two) times daily.   calcium carbonate 500 MG chewable tablet Commonly known as:  TUMS - dosed in mg elemental calcium Chew 0.5 tablets by mouth as needed for indigestion or heartburn.   enoxaparin 120 MG/0.8ML injection Commonly known as:   LOVENOX Inject 0.8 mLs (120 mg total) into the skin daily for 10 days. Q 6pm. Last dose 10/15/17 Start taking on:  10/05/2017   enoxaparin Kit Commonly known as:  LOVENOX 1 kit by Does not apply route once for 1 dose.   FISH OIL PO Take 2 capsules by mouth daily.   Flax Seed Oil 1000 MG Caps Take 1,000 mg by mouth 2 (two) times daily.   GOLD BOND ULTIMATE HEALING Crea Apply 1 application topically daily as needed (wound care).   ibuprofen 200 MG tablet Commonly known as:  ADVIL,MOTRIN Take 200-400 mg by mouth 2 (  two) times daily as needed for headache or moderate pain.   isosorbide mononitrate 120 MG 24 hr tablet Commonly known as:  IMDUR Take 1 tablet (120 mg total) by mouth daily. What changed:  Another medication with the same name was removed. Continue taking this medication, and follow the directions you see here.   loperamide 2 MG tablet Commonly known as:  IMODIUM A-D Take 2-4 mg by mouth 2 (two) times daily as needed for diarrhea or loose stools.   metoprolol tartrate 25 MG tablet Commonly known as:  LOPRESSOR TAKE 0.5 TABLETS BY MOUTH 2 TIMES DAILY   NASONEX 50 MCG/ACT nasal spray Generic drug:  mometasone Place 2 sprays into the nose daily as needed (allergies).   nitroGLYCERIN 0.4 MG SL tablet Commonly known as:  NITROSTAT Place 1 tablet (0.4 mg total) under the tongue every 5 (five) minutes as needed for chest pain.   pantoprazole 40 MG tablet Commonly known as:  PROTONIX Take 1 tablet (40 mg total) by mouth daily. Start taking on:  10/05/2017 Replaces:  omeprazole 40 MG capsule   Probiotic Caps Take 1 capsule by mouth daily.   ramipril 5 MG capsule Commonly known as:  ALTACE TAKE ONE CAPSULE BY MOUTH DAILY   timolol 0.5 % ophthalmic solution Commonly known as:  BETIMOL Place 1 drop into both eyes 2 (two) times daily.   Turmeric Curcumin 500 MG Caps Take 500 mg by mouth 2 (two) times daily.        Aspirin prescribed at discharge?  Yes High  Intensity Statin Prescribed? (Lipitor 40-56m or Crestor 20-484m: Yes Beta Blocker Prescribed? Yes For EF <40%, was ACEI/ARB Prescribed? No: NA ADP Receptor Inhibitor Prescribed? (i.e. Plavix etc.-Includes Medically Managed Patients): No: For CABG For EF <40%, Aldosterone Inhibitor Prescribed? No: NA Was EF assessed during THIS hospitalization? Yes Was Cardiac Rehab II ordered? (Included Medically managed Patients): Yes   Outstanding Labs/Studies     Duration of Discharge Encounter   Greater than 30 minutes including physician time.  SiAngelena FormA 10/04/2017, 2:49 PM

## 2017-10-04 NOTE — Progress Notes (Signed)
Preliminary results by tech - Right Groin Pseudoaneurysm Thrombin Injection Completed. Successful closure/thrombosed pseudoaneurysm following procedure. Marilynne Halstedita Angeles Zehner, BS, RDMS, RVT

## 2017-10-04 NOTE — Progress Notes (Addendum)
Progress Note  Patient Name: [name] Date of Encounter: 10/04/2017  Primary Cardiologist: Dr Antoine PocheHochrein  Subjective   No CP or dyspnea  Inpatient Medications    Scheduled Meds: . amLODipine  5 mg Oral Daily  . aspirin  81 mg Oral Daily  . atorvastatin  80 mg Oral q1800  . isosorbide mononitrate  120 mg Oral Daily  . metoprolol tartrate  25 mg Oral BID  . pantoprazole  40 mg Oral Daily  . sodium chloride flush  3 mL Intravenous Q12H  . thrombin recombinant  20,000 Units Topical Once   Continuous Infusions: . sodium chloride 50 mL/hr at 10/03/17 0436  . sodium chloride     PRN Meds: sodium chloride, acetaminophen, diazepam, ondansetron (ZOFRAN) IV, sodium chloride flush   Vital Signs    Vitals:   10/03/17 1538 10/03/17 1951 10/04/17 0008 10/04/17 0455  BP: 102/68 106/76 113/67 110/69  Pulse:  78 68 62  Resp: 13 (!) 21 16 17   Temp: 98.5 F (36.9 C) 97.6 F (36.4 C) 98.7 F (37.1 C) 98.1 F (36.7 C)  TempSrc: Oral Oral Oral Oral  SpO2: 95% 93% 93% 92%  Weight:      Height:        Intake/Output Summary (Last 24 hours) at 10/04/2017 0730 Last data filed at 10/04/2017 0457 Gross per 24 hour  Intake 2921.27 ml  Output 1550 ml  Net 1371.27 ml   Filed Weights   10/02/17 1147  Weight: 170 lb (77.1 kg)    Telemetry    Sinus with brief Mobitz 1- Personally Reviewed   Physical Exam   GEN: WD/WN No acute distress.   Neck: supple Cardiac: RRR, 3/6 systolic murmur Respiratory: CTA GI: Soft, nontender, non-distended, right groin with bruit; pulsatile mass noted on exam today MS: No edema Neuro:  Grossly intact   Labs    Chemistry Recent Labs  Lab 10/02/17 1246 10/03/17 0419 10/04/17 0306  NA 132* 137 138  K 4.1 4.0 4.0  CL 101 105 106  CO2 24 25 26   GLUCOSE 87 84 92  BUN 14 12 12   CREATININE 0.82 0.86 0.83  CALCIUM 8.7* 8.3* 8.5*  PROT  --   --  7.0  ALBUMIN  --   --  3.4*  AST  --   --  31  ALT  --   --  31  ALKPHOS  --   --  30*    BILITOT  --   --  0.6  GFRNONAA >60 >60 >60  GFRAA >60 >60 >60  ANIONGAP 7 7 6      Hematology Recent Labs  Lab 10/03/17 0419 10/03/17 0956 10/04/17 0306  WBC 5.8 6.1 7.1  RBC 3.77* 4.08* 3.82*  HGB 10.3* 11.3* 10.4*  HCT 31.9* 34.3* 32.3*  MCV 84.6 84.1 84.6  MCH 27.3 27.7 27.2  MCHC 32.3 32.9 32.2  RDW 16.5* 16.5* 16.5*  PLT 171 174 167     Patient Profile     66 y.o. male admitted with UA; cath with 3 vessel CAD; previous echo with moderate to severe AS.   Assessment & Plan    1 coronary artery disease-cardiac catheterization revealed severe three-vessel coronary disease.  Continue aspirin, heparin, statin and beta-blocker.  Patient has been seen by Dr. Cornelius Moraswen.  Plan is for coronary artery bypass and graft as well as aortic valve replacement.  However schedule will not allow until December 28 if he remains in hospital.  His symptoms are relatively  stable.  He has chest discomfort only with more vigorous activities and has no symptoms at rest.  I therefore feel that he can be discharged and I have asked him to limit his activities.  Proceed with surgery on January 3 (discussed with Dr Cornelius Moraswen).  We will arrange cardiac gated CT prior to discharge.  He will also need pulmonary function tests and carotid Dopplers which potentially could be performed as an outpatient.  2 moderate to severe aortic stenosis-echocardiogram reveals moderate to severe aortic stenosis.  Plan aortic valve replacement at time of coronary artery bypass and graft.  3 pseudoaneurysm-patient has been seen by vascular surgery.  Plan thrombin injection today.  If unsuccessful he will need surgical repair.  His pseudoaneurysm obviously will be taking care of prior to discharge.  4 history of DVT and lupus-will cover with lovenox at DC for 2 weeks (begin when ok with vascular surgery; DC Jan 2 after AM dose); resume coumadin following CABG/AVR.  5 hyperlipidemia-continue statin.  6 hypertension-blood pressure is  controlled.  Continue present medications.  7 transient Mobitz 1-no symptoms.  Decrease metoprolol to 12.5 mg twice daily.  > 30 min PA and physician time D2  For questions or updates, please contact [facility] Please consult [email or web address] for contact info under Cardiology/STEMI.      Signed, Olga MillersBrian Pollie Poma, MD  10/04/2017, 7:30 AM

## 2017-10-04 NOTE — Progress Notes (Signed)
CCMD called about pt going into unsustained brady in 30's; unsustained type 2 heart block; cehcked on pt - up to bathroom with nausea and diarrhea; given Zofran and notified MD.   [place]  Kaspar Albornoz, RN

## 2017-10-05 DIAGNOSIS — I2510 Atherosclerotic heart disease of native coronary artery without angina pectoris: Secondary | ICD-10-CM

## 2017-10-05 DIAGNOSIS — I209 Angina pectoris, unspecified: Secondary | ICD-10-CM

## 2017-10-05 DIAGNOSIS — Z9861 Coronary angioplasty status: Secondary | ICD-10-CM

## 2017-10-05 LAB — CBC
HCT: 32.9 % — ABNORMAL LOW (ref 39.0–52.0)
Hemoglobin: 10.4 g/dL — ABNORMAL LOW (ref 13.0–17.0)
MCH: 26.5 pg (ref 26.0–34.0)
MCHC: 31.6 g/dL (ref 30.0–36.0)
MCV: 83.7 fL (ref 78.0–100.0)
PLATELETS: 147 10*3/uL — AB (ref 150–400)
RBC: 3.93 MIL/uL — AB (ref 4.22–5.81)
RDW: 16.4 % — ABNORMAL HIGH (ref 11.5–15.5)
WBC: 6.7 10*3/uL (ref 4.0–10.5)

## 2017-10-05 MED ORDER — CHLORHEXIDINE GLUCONATE 4 % EX LIQD: 30.0000 mL | CUTANEOUS | 0 refills | Status: DC

## 2017-10-05 MED ORDER — CHLORHEXIDINE GLUCONATE 0.12 % MT SOLN
15.0000 mL | Freq: Once | OROMUCOSAL | 0 refills | Status: AC
Start: 2017-10-05 — End: 2017-10-05

## 2017-10-05 MED ORDER — ENOXAPARIN SODIUM 120 MG/0.8ML ~~LOC~~ SOLN: 120.0000 mg | SUBCUTANEOUS | 0 refills | Status: DC

## 2017-10-05 NOTE — Progress Notes (Signed)
Progress Note  Patient Name: [name] Date of Encounter: 10/05/2017  Primary Cardiologist: Dr Antoine PocheHochrein  Subjective   Denies CP or dyspnea; diarrhea yesterday  Inpatient Medications    Scheduled Meds: . amLODipine  5 mg Oral Daily  . aspirin  81 mg Oral Daily  . atorvastatin  80 mg Oral q1800  . chlorhexidine  30 mL Topical UD  . chlorhexidine  15 mL Mouth/Throat Once  . enoxaparin (LOVENOX) injection  120 mg Subcutaneous Q24H  . isosorbide mononitrate  120 mg Oral Daily  . metoprolol tartrate  12.5 mg Oral BID  . metoprolol tartrate  12.5 mg Oral Once  . pantoprazole  40 mg Oral Daily  . sodium chloride flush  3 mL Intravenous Q12H   Continuous Infusions: . sodium chloride 50 mL/hr at 10/03/17 0436  . sodium chloride     PRN Meds: sodium chloride, acetaminophen, diazepam, loperamide, ondansetron (ZOFRAN) IV, sodium chloride flush   Vital Signs    Vitals:   10/04/17 1950 10/04/17 2230 10/04/17 2348 10/05/17 0449  BP: 104/71 117/76 126/69 110/63  Pulse:  71    Resp: 17  17 17   Temp: 98.4 F (36.9 C)  98.2 F (36.8 C) 98.2 F (36.8 C)  TempSrc: Oral  Oral Oral  SpO2: 95%  96% 95%  Weight:      Height:        Intake/Output Summary (Last 24 hours) at 10/05/2017 0723 Last data filed at 10/05/2017 0452 Gross per 24 hour  Intake 1487.17 ml  Output 0 ml  Net 1487.17 ml   Filed Weights   10/02/17 1147  Weight: 170 lb (77.1 kg)    Telemetry    Sinus with episode of CHB during BM- Personally Reviewed   Physical Exam   GEN: WD/WN, NAD Neck: supple, no JVD Cardiac: RRR, 3/6 systolic murmur, no DM Respiratory: CTA; no wheeze GI: Soft, nontender, non-distended, right groin with mild tenderness to palpation; no bruit following thrombin injection MS: No edema Neuro:  No focal findings   Labs    Chemistry Recent Labs  Lab 10/03/17 0419 10/04/17 0306 10/04/17 1621  NA 137 138 136  K 4.0 4.0 3.7  CL 105 106 105  CO2 25 26 24   GLUCOSE 84 92  95  BUN 12 12 14   CREATININE 0.86 0.83 0.87  CALCIUM 8.3* 8.5* 8.6*  PROT  --  7.0 7.9  ALBUMIN  --  3.4* 4.0  AST  --  31 34  ALT  --  31 33  ALKPHOS  --  30* 31*  BILITOT  --  0.6 0.8  GFRNONAA >60 >60 >60  GFRAA >60 >60 >60  ANIONGAP 7 6 7      Hematology Recent Labs  Lab 10/04/17 0306 10/04/17 1621 10/05/17 0325  WBC 7.1 7.2 6.7  RBC 3.82* 4.32 3.93*  HGB 10.4* 11.9* 10.4*  HCT 32.3* 36.1* 32.9*  MCV 84.6 83.6 83.7  MCH 27.2 27.5 26.5  MCHC 32.2 33.0 31.6  RDW 16.5* 16.2* 16.4*  PLT 167 170 147*     Patient Profile     66 y.o. male admitted with UA; cath with 3 vessel CAD; previous echo with moderate to severe AS.   Assessment & Plan    1 coronary artery disease-cardiac catheterization revealed severe three-vessel coronary disease.  Continue aspirin, statin and beta-blocker.  Patient has been seen by Dr. Cornelius Moraswen.  Plan is for coronary artery bypass and graft as well as aortic valve replacement on  Jan 3.  Cardiac CTA has been performed; carotid dopplers show 1-39 bilateral stenosis. PFTs as outpt.  2 moderate to severe aortic stenosis-for AVR at time of CABG.  3 pseudoaneurysm-s/p thrombin injection; no bruit this AM.  4 history of DVT and lupus-plan begin lovenox this PM and continue through 10/16/16; hold PM dose 1/2 prior to surgery; resume coumadin following CABG/AVR.  5 hyperlipidemia-continue statin.  6 hypertension-blood pressure is controlled.  Continue present medications.  7 transient CHB-occurred while straining during BM, abdominal cramping and mild nausea; likely vagal in etiology; no further arrhythmia since.  DC today; surgery 1/3.  > 30 min PA and physician time D2  For questions or updates, please contact [facility] Please consult [email or web address] for contact info under Cardiology/STEMI.      Signed, Olga MillersBrian Crenshaw, MD  10/05/2017, 7:23 AM

## 2017-10-05 NOTE — Discharge Instructions (Signed)
TAKE your metoprolol a.m. of surgery   Pseudoaneurysm An aneurysm is a bulge in an artery. A pseudoaneurysm happens when an artery is injured and blood leaks out to form a sac-like bulge. The bulge can break open, causing bleeding in the nearby tissues. What are the causes? The most common cause of this condition is a procedure such as an angiogram in which a thin tube (catheter) is inserted into an artery. After an angiogram, the insertion site on the artery should close back up all the way. If it does not, blood may leak out of the artery. Other causes of a pseudoaneurysminclude:  Trauma to the walls of an artery, such as from a stabbing injury or a deep cut.  Bypass artery grafting surgery, which is a type of surgery that makes blood flow to the heart better.  An infection that affects the walls of an artery.  A heart attack (myocardial infarction).  An aneurysm that bursts (ruptures).  What are the signs or symptoms? Symptoms of this condition include:  Pain, soreness, or tenderness at the site of the pseudoaneurysm.  Swelling.  Bruising or a change in skin color.  A throbbing mass or lump at the site.  How is this diagnosed? This condition may be diagnosed based on your symptoms, a physical exam, and an imaging test called a Doppler ultrasound. This imaging test uses sound waves to show the blood flow in the arteries and the pseudoaneurysm. How is this treated? This condition may go away on its own without treatment. To help prevent bleeding that cannot be controlled, or to help prevent other problems, your health care provider may suggest one of these treatments:  Injecting a blood-clotting enzyme, such as thrombin, into the site.  Fixing the artery with surgery.  Putting pressure (compression) on the pseudoaneurysm.  Follow these instructions at home:  Take over-the-counter and prescription medicines only as told by your health care provider.  Return to your normal  activities as told by your health care provider. Ask your health care provider what activities are safe for you.  Keep all follow-up visits as told by your health care provider. This is important. Contact a health care provider if:  Your pain, soreness, or tenderness at the pseudoaneurysm site keeps getting worse.  You have swelling at the site. Get help right away if:  You have severe or ongoing (persistent) pain at the site of the pseudoaneurysm.  There is bleeding or drainage from the site.  The part of your body where the pseudoaneurysm is located changes color or becomes painful, cold, or numb.  You have chest pain or shortness of breath.  You feel like you might faint or you faint. This information is not intended to replace advice given to you by your health care provider. Make sure you discuss any questions you have with your health care provider. Document Released: 03/19/2008 Document Revised: 07/13/2016 Document Reviewed: 07/13/2016 Elsevier Interactive Patient Education  [employer]2018 [employer]

## 2017-10-05 NOTE — Progress Notes (Signed)
Attempted to call Rowland LatheSusan Wright at cardiovascular surgery office re released orders for pt. CABG. Night shift RN said to call in order to retract the orders, as pt. CABG scheduled in January. Office said physicians do not work at that office and were unable to provide assistance. Will continue to attempt clarification.

## 2017-10-05 NOTE — Discharge Summary (Signed)
See discharge done 12/29.  Patient had transient complete heart block  while straining at bowel and discharge was canceled.  Patient seen by Dr. Stanford Breed on 12/22. Exam unchanged. Vital signs stable. No further arrhythmia overnight.  Patient stable for discharge on 12/22, return for surgery on 1/3 as scheduled.  Allergies as of 10/05/2017      Reactions   Sulfa Antibiotics Other (See Comments)   Makes patient jittery, prefers not to take them.       Medication List    STOP taking these medications   aspirin EC 325 MG tablet Replaced by:  aspirin 81 MG chewable tablet   omeprazole 40 MG capsule Commonly known as:  PRILOSEC Replaced by:  pantoprazole 40 MG tablet   RANEXA 500 MG 12 hr tablet Generic drug:  ranolazine   rosuvastatin 5 MG tablet Commonly known as:  CRESTOR   warfarin 4 MG tablet Commonly known as:  COUMADIN     TAKE these medications   acetaminophen 325 MG tablet Commonly known as:  TYLENOL Take 2 tablets (650 mg total) by mouth every 4 (four) hours as needed for headache or mild pain.   amLODipine 5 MG tablet Commonly known as:  NORVASC TAKE ONE TABLET BY MOUTH DAILY What changed:    how much to take  how to take this  when to take this   aspirin 81 MG chewable tablet Chew 1 tablet (81 mg total) by mouth daily. Replaces:  aspirin EC 325 MG tablet   atorvastatin 80 MG tablet Commonly known as:  LIPITOR Take 1 tablet (80 mg total) by mouth daily at 6 PM.   Azelastine HCl 0.15 % Soln USE 2 SPRAYS BY NASAL ROUTE AS NEEDED FOR CONGESTION   brimonidine 0.15 % ophthalmic solution Commonly known as:  ALPHAGAN Place 1 drop into both eyes 2 (two) times daily.   calcium carbonate 500 MG chewable tablet Commonly known as:  TUMS - dosed in mg elemental calcium Chew 0.5 tablets by mouth as needed for indigestion or heartburn.   chlorhexidine 0.12 % solution Commonly known as:  PERIDEX Use as directed 15 mLs in the mouth or throat once  for 1 dose.   chlorhexidine 4 % external liquid Commonly known as:  HIBICLENS Apply 30 mLs (2 application total) topically as directed.   enoxaparin 120 MG/0.8ML injection Commonly known as:  LOVENOX Inject 0.8 mLs (120 mg total) into the skin daily for 10 days. Q 6pm. Last dose 10/15/17   FISH OIL PO Take 2 capsules by mouth daily.   Flax Seed Oil 1000 MG Caps Take 1,000 mg by mouth 2 (two) times daily.   GOLD BOND ULTIMATE HEALING Crea Apply 1 application topically daily as needed (wound care).   ibuprofen 200 MG tablet Commonly known as:  ADVIL,MOTRIN Take 200-400 mg by mouth 2 (two) times daily as needed for headache or moderate pain.   isosorbide mononitrate 120 MG 24 hr tablet Commonly known as:  IMDUR Take 1 tablet (120 mg total) by mouth daily. What changed:  Another medication with the same name was removed. Continue taking this medication, and follow the directions you see here.   loperamide 2 MG tablet Commonly known as:  IMODIUM A-D Take 2-4 mg by mouth 2 (two) times daily as needed for diarrhea or loose stools.   metoprolol tartrate 25 MG tablet Commonly known as:  LOPRESSOR TAKE 0.5 TABLETS BY MOUTH 2 TIMES DAILY   NASONEX 50 MCG/ACT nasal spray Generic drug:  mometasone Place 2 sprays into the nose daily as needed (allergies).   nitroGLYCERIN 0.4 MG SL tablet Commonly known as:  NITROSTAT Place 1 tablet (0.4 mg total) under the tongue every 5 (five) minutes as needed for chest pain.   pantoprazole 40 MG tablet Commonly known as:  PROTONIX Take 1 tablet (40 mg total) by mouth daily. Replaces:  omeprazole 40 MG capsule   Probiotic Caps Take 1 capsule by mouth daily.   ramipril 5 MG capsule Commonly known as:  ALTACE TAKE ONE CAPSULE BY MOUTH DAILY   timolol 0.5 % ophthalmic solution Commonly known as:  BETIMOL Place 1 drop into both eyes 2 (two) times daily.   Turmeric Curcumin 500 MG Caps Take 500 mg by mouth 2 (two) times daily.     ASK  your doctor about these medications   enoxaparin Kit Commonly known as:  LOVENOX 1 kit by Does not apply route once for 1 dose. Ask about: Should I take this medication?       Rosaria Ferries, PA-C 10/05/2017 10:24 AM Beeper [phone]

## 2017-10-05 NOTE — Progress Notes (Signed)
Patient IVs removed, paperwork and belongings packed, and wheeled to vehicle. Discharged home with wife

## 2017-10-06 ENCOUNTER — Other Ambulatory Visit: Payer: Self-pay | Admitting: Cardiology

## 2017-10-07 ENCOUNTER — Encounter (HOSPITAL_COMMUNITY): Payer: PPO

## 2017-10-07 ENCOUNTER — Other Ambulatory Visit (HOSPITAL_COMMUNITY): Payer: Self-pay | Admitting: Respiratory Therapy

## 2017-10-07 NOTE — Telephone Encounter (Signed)
REFILL 

## 2017-10-09 ENCOUNTER — Other Ambulatory Visit: Payer: Self-pay | Admitting: Adult Health

## 2017-10-09 ENCOUNTER — Other Ambulatory Visit: Payer: Self-pay | Admitting: Cardiology

## 2017-10-09 ENCOUNTER — Other Ambulatory Visit: Payer: Self-pay | Admitting: *Deleted

## 2017-10-09 ENCOUNTER — Telehealth: Payer: Self-pay | Admitting: Cardiology

## 2017-10-09 DIAGNOSIS — M79604 Pain in right leg: Secondary | ICD-10-CM | POA: Insufficient documentation

## 2017-10-09 DIAGNOSIS — M25561 Pain in right knee: Secondary | ICD-10-CM

## 2017-10-09 NOTE — Telephone Encounter (Signed)
Please call,pt had Cath last Wednesday.Now he is having problem in the leg where they did the Cath.He is hurting areund the knee joint,he had a low grade fever on Monday it was 100.This morning his fever was 99..Marland Kitchen

## 2017-10-09 NOTE — Telephone Encounter (Signed)
Returned call to [name] (spouse)  She states that pt is having knee pain, knee is warm to the touch and fever was 100.7 and is now 99. She states that he has had joint problems in the past. She denies any redness, pain or swelling at surgical site. She states that is is just located in the knee area. We do not feel this is related to recent surgery. Pt states that Dr Barry Dieneswens told him to call here. Informed Kenneth HarpsDebra Keith (spouse) to callthem again and ask if they will want him premedicated d/t surgery next week, or see if they want him to call PCP. They will have to make decision.

## 2017-10-10 ENCOUNTER — Other Ambulatory Visit: Payer: Self-pay | Admitting: *Deleted

## 2017-10-10 ENCOUNTER — Encounter: Payer: Self-pay | Admitting: Vascular Surgery

## 2017-10-10 ENCOUNTER — Ambulatory Visit (HOSPITAL_COMMUNITY)
Admission: RE | Admit: 2017-10-10 | Discharge: 2017-10-10 | Disposition: A | Discharge status: Home / Self Care | Payer: PPO | Source: Ambulatory Visit | Attending: Vascular Surgery | Admitting: Vascular Surgery

## 2017-10-10 ENCOUNTER — Encounter (HOSPITAL_COMMUNITY): Payer: PPO

## 2017-10-10 ENCOUNTER — Ambulatory Visit: Payer: PPO | Admitting: Adult Health

## 2017-10-10 DIAGNOSIS — M25561 Pain in right knee: Secondary | ICD-10-CM | POA: Insufficient documentation

## 2017-10-10 DIAGNOSIS — I729 Aneurysm of unspecified site: Secondary | ICD-10-CM

## 2017-10-10 DIAGNOSIS — Z9889 Other specified postprocedural states: Secondary | ICD-10-CM | POA: Insufficient documentation

## 2017-10-10 NOTE — Progress Notes (Signed)
ERRONEOUS ENCOUNTER

## 2017-10-10 NOTE — Progress Notes (Signed)
Pt duplex reviewed.  No DVT.  Pseudoaneurysm remains thrombosed.  Pt primary complaint in right leg is due to prior tendon repair in his right leg.  He will seek orthopedic eval of this after his heart operation.  Discussed with orthopedics that would encourage pt not to immobilize leg and to continue walking and have formal orthopedic eval as outpt post op.  Fabienne Brunsharles Fields, MD [facility] Office: [phone] Pager: [phone]

## 2017-10-12 ENCOUNTER — Other Ambulatory Visit: Payer: Self-pay | Admitting: Adult Health

## 2017-10-14 ENCOUNTER — Other Ambulatory Visit: Payer: Self-pay | Admitting: *Deleted

## 2017-10-14 DIAGNOSIS — I2510 Atherosclerotic heart disease of native coronary artery without angina pectoris: Secondary | ICD-10-CM

## 2017-10-14 DIAGNOSIS — I350 Nonrheumatic aortic (valve) stenosis: Secondary | ICD-10-CM

## 2017-10-14 NOTE — Telephone Encounter (Signed)
Pt is no longer taking this medication.  Was discontinued in hospital.  See discharge note from 10/05/17.  Replaced with pantoprazole.

## 2017-10-14 NOTE — Pre-Procedure Instructions (Signed)
Chipper HerbDavid T Mallo  10/14/2017      [facility] [place] [place], [place] - [address] [place] [address] Phone: [phone] Fax: [phone]    Your procedure is scheduled on Jan 3.  [facility].M.  Call this number if you have problems the morning of surgery:  [phone]   Remember:  Do not eat food or drink liquids after midnight.  Take these medicines the morning of surgery with A SIP OF WATER eye drops, Isosorbide mononitrate (Imdur), Metoprolol Tartrate (Lopressor), Nitrostat if needed, Pantoprazole (Protonix)  Stop/take aspirin as directed by your Dr.      Drucilla Schmidto not wear jewelry, make-up or nail polish.  Do not wear lotions, powders, or perfumes, or deodorant.  Do not shave 48 hours prior to surgery.  Men may shave face and neck.  Do not bring valuables to the hospital.  [facility] is not responsible for any belongings or valuables.  Contacts, dentures or bridgework may not be worn into surgery.  Leave your suitcase in the car.  After surgery it may be brought to your room.  For patients admitted to the hospital, discharge time will be determined by your treatment team.  Patients discharged the day of surgery will not be allowed to drive home.   Special instructions:  [facility] - Preparing for Surgery  Before surgery, you can play an important role.  Because skin is not sterile, your skin needs to be as free of germs as possible.  You can reduce the number of germs on you skin by washing with CHG (chlorahexidine gluconate) soap before surgery.  CHG is an antiseptic cleaner which kills germs and bonds with the skin to continue killing germs even after washing.  Please DO NOT use if you have an allergy to CHG or antibacterial soaps.  If your skin becomes reddened/irritated stop using the CHG and inform your nurse when you arrive at Short Stay.  Do not shave (including legs and  underarms) for at least 48 hours prior to the first CHG shower.  You may shave your face.  Please follow these instructions carefully:   1.  Shower with CHG Soap the night before surgery and the  morning of Surgery.  2.  If you choose to wash your hair, wash your hair first as usual with your  normal shampoo.  3.  After you shampoo, rinse your hair and body thoroughly to remove the Shampoo.  4.  Use CHG as you would any other liquid soap.  You can apply chg directly  to the skin and wash gently with scrungie or a clean washcloth.  5.  Apply the CHG Soap to your body ONLY FROM THE NECK DOWN.  Do not use on open wounds or open sores.  Avoid contact with your eyes,  ears, mouth and genitals (private parts).  Wash genitals (private parts)   with your normal soap.  6.  Wash thoroughly, paying special attention to the area where your surgery will be performed.  7.  Thoroughly rinse your body with warm water from the neck down.  8.  DO NOT shower/wash with your normal soap after using and rinsing off   the CHG Soap.  9.  Pat yourself dry with a clean towel.            10.  Wear clean pajamas.  11.  Place clean sheets on your bed the night of your first shower and do not sleep with pets.  Day of Surgery  Do not apply any lotions/deoderants the morning of surgery.  Please wear clean clothes to the hospital/surgery center.     Please read over the following fact sheets that you were given. Pain Booklet, Coughing and Deep Breathing, MRSA Information and Surgical Site Infection Prevention

## 2017-10-15 ENCOUNTER — Other Ambulatory Visit: Payer: Self-pay | Admitting: Adult Health

## 2017-10-16 ENCOUNTER — Encounter (HOSPITAL_COMMUNITY)
Admit: 2017-10-16 | Discharge: 2017-10-16 | Disposition: A | Discharge status: Home / Self Care | Payer: PPO | Attending: Thoracic Surgery (Cardiothoracic Vascular Surgery) | Admitting: Thoracic Surgery (Cardiothoracic Vascular Surgery)

## 2017-10-16 ENCOUNTER — Ambulatory Visit: Payer: PPO | Admitting: Thoracic Surgery (Cardiothoracic Vascular Surgery)

## 2017-10-16 ENCOUNTER — Ambulatory Visit (HOSPITAL_COMMUNITY)
Admission: RE | Admit: 2017-10-16 | Discharge: 2017-10-16 | Disposition: A | Discharge status: Home / Self Care | Payer: PPO | Source: Ambulatory Visit | Attending: Thoracic Surgery (Cardiothoracic Vascular Surgery) | Admitting: Thoracic Surgery (Cardiothoracic Vascular Surgery)

## 2017-10-16 ENCOUNTER — Encounter (HOSPITAL_COMMUNITY): Payer: Self-pay

## 2017-10-16 ENCOUNTER — Other Ambulatory Visit: Payer: Self-pay

## 2017-10-16 ENCOUNTER — Encounter: Payer: Self-pay | Admitting: Thoracic Surgery (Cardiothoracic Vascular Surgery)

## 2017-10-16 VITALS — BP 105/63 | HR 73 | Resp 18 | Ht 69.0 in | Wt 165.0 lb

## 2017-10-16 DIAGNOSIS — Z9861 Coronary angioplasty status: Secondary | ICD-10-CM | POA: Diagnosis not present

## 2017-10-16 DIAGNOSIS — I209 Angina pectoris, unspecified: Secondary | ICD-10-CM

## 2017-10-16 DIAGNOSIS — D62 Acute posthemorrhagic anemia: Secondary | ICD-10-CM | POA: Diagnosis not present

## 2017-10-16 DIAGNOSIS — Z7901 Long term (current) use of anticoagulants: Secondary | ICD-10-CM | POA: Diagnosis not present

## 2017-10-16 DIAGNOSIS — E8881 Metabolic syndrome: Secondary | ICD-10-CM | POA: Diagnosis present

## 2017-10-16 DIAGNOSIS — H409 Unspecified glaucoma: Secondary | ICD-10-CM | POA: Diagnosis present

## 2017-10-16 DIAGNOSIS — I447 Left bundle-branch block, unspecified: Secondary | ICD-10-CM | POA: Diagnosis not present

## 2017-10-16 DIAGNOSIS — H9190 Unspecified hearing loss, unspecified ear: Secondary | ICD-10-CM | POA: Diagnosis present

## 2017-10-16 DIAGNOSIS — I350 Nonrheumatic aortic (valve) stenosis: Secondary | ICD-10-CM

## 2017-10-16 DIAGNOSIS — M3213 Lung involvement in systemic lupus erythematosus: Secondary | ICD-10-CM | POA: Diagnosis present

## 2017-10-16 DIAGNOSIS — I493 Ventricular premature depolarization: Secondary | ICD-10-CM | POA: Diagnosis not present

## 2017-10-16 DIAGNOSIS — I10 Essential (primary) hypertension: Secondary | ICD-10-CM | POA: Diagnosis present

## 2017-10-16 DIAGNOSIS — R110 Nausea: Secondary | ICD-10-CM | POA: Diagnosis not present

## 2017-10-16 DIAGNOSIS — I2510 Atherosclerotic heart disease of native coronary artery without angina pectoris: Secondary | ICD-10-CM | POA: Diagnosis present

## 2017-10-16 DIAGNOSIS — E876 Hypokalemia: Secondary | ICD-10-CM | POA: Diagnosis not present

## 2017-10-16 DIAGNOSIS — I083 Combined rheumatic disorders of mitral, aortic and tricuspid valves: Secondary | ICD-10-CM | POA: Diagnosis present

## 2017-10-16 DIAGNOSIS — I25700 Atherosclerosis of coronary artery bypass graft(s), unspecified, with unstable angina pectoris: Secondary | ICD-10-CM | POA: Diagnosis not present

## 2017-10-16 DIAGNOSIS — I4891 Unspecified atrial fibrillation: Secondary | ICD-10-CM | POA: Diagnosis not present

## 2017-10-16 DIAGNOSIS — I200 Unstable angina: Secondary | ICD-10-CM

## 2017-10-16 DIAGNOSIS — K2270 Barrett's esophagus without dysplasia: Secondary | ICD-10-CM | POA: Diagnosis present

## 2017-10-16 DIAGNOSIS — K219 Gastro-esophageal reflux disease without esophagitis: Secondary | ICD-10-CM | POA: Diagnosis present

## 2017-10-16 DIAGNOSIS — J9811 Atelectasis: Secondary | ICD-10-CM | POA: Diagnosis not present

## 2017-10-16 DIAGNOSIS — R197 Diarrhea, unspecified: Secondary | ICD-10-CM | POA: Diagnosis not present

## 2017-10-16 DIAGNOSIS — I440 Atrioventricular block, first degree: Secondary | ICD-10-CM | POA: Diagnosis not present

## 2017-10-16 DIAGNOSIS — E785 Hyperlipidemia, unspecified: Secondary | ICD-10-CM | POA: Diagnosis present

## 2017-10-16 DIAGNOSIS — I25110 Atherosclerotic heart disease of native coronary artery with unstable angina pectoris: Secondary | ICD-10-CM | POA: Diagnosis present

## 2017-10-16 DIAGNOSIS — D6862 Lupus anticoagulant syndrome: Secondary | ICD-10-CM | POA: Diagnosis present

## 2017-10-16 DIAGNOSIS — I358 Other nonrheumatic aortic valve disorders: Secondary | ICD-10-CM | POA: Diagnosis not present

## 2017-10-16 DIAGNOSIS — J189 Pneumonia, unspecified organism: Secondary | ICD-10-CM | POA: Diagnosis not present

## 2017-10-16 DIAGNOSIS — I739 Peripheral vascular disease, unspecified: Secondary | ICD-10-CM | POA: Diagnosis present

## 2017-10-16 DIAGNOSIS — M1711 Unilateral primary osteoarthritis, right knee: Secondary | ICD-10-CM | POA: Diagnosis present

## 2017-10-16 DIAGNOSIS — J9859 Other diseases of mediastinum, not elsewhere classified: Secondary | ICD-10-CM | POA: Diagnosis present

## 2017-10-16 HISTORY — DX: Personal history of other diseases of the digestive system: Z87.19

## 2017-10-16 HISTORY — DX: Peripheral vascular disease, unspecified: I73.9

## 2017-10-16 LAB — COMPREHENSIVE METABOLIC PANEL
ALBUMIN: 3.6 g/dL (ref 3.5–5.0)
ALK PHOS: 22 U/L — AB (ref 38–126)
ALT: 24 U/L (ref 17–63)
ANION GAP: 9 (ref 5–15)
AST: 24 U/L (ref 15–41)
BILIRUBIN TOTAL: 0.6 mg/dL (ref 0.3–1.2)
BUN: 18 mg/dL (ref 6–20)
CALCIUM: 9.2 mg/dL (ref 8.9–10.3)
CO2: 23 mmol/L (ref 22–32)
CREATININE: 0.69 mg/dL (ref 0.61–1.24)
Chloride: 103 mmol/L (ref 101–111)
GFR calc Af Amer: >60 mL/min (ref >60)
GFR calc non Af Amer: >60 mL/min (ref >60)
GLUCOSE: 93 mg/dL (ref 65–99)
Potassium: 4.2 mmol/L (ref 3.5–5.1)
SODIUM: 135 mmol/L (ref 135–145)
TOTAL PROTEIN: 8.2 g/dL — AB (ref 6.5–8.1)

## 2017-10-16 LAB — PULMONARY FUNCTION TEST
DL/VA % PRED: 84 %
DL/VA: 3.85 ml/min/mmHg/L
DLCO COR % PRED: 58 %
DLCO COR: 18.2 ml/min/mmHg
DLCO UNC % PRED: 50 %
DLCO unc: 15.6 ml/min/mmHg
FEF 25-75 PRE: 1.34 L/s
FEF 25-75 Post: 2.03 L/sec
FEF2575-%Change-Post: 50 %
FEF2575-%Pred-Post: 79 %
FEF2575-%Pred-Pre: 52 %
FEV1-%Change-Post: 6 %
FEV1-%Pred-Post: 64 %
FEV1-%Pred-Pre: 60 %
FEV1-Post: 2.08 L
FEV1-Pre: 1.96 L
FEV1FVC-%Change-Post: -1 %
FEV1FVC-%Pred-Pre: 101 %
FEV6-%CHANGE-POST: 7 %
FEV6-%PRED-POST: 66 %
FEV6-%PRED-PRE: 62 %
FEV6-PRE: 2.58 L
FEV6-Post: 2.76 L
FEV6FVC-%CHANGE-POST: 0 %
FEV6FVC-%PRED-POST: 103 %
FEV6FVC-%PRED-PRE: 103 %
FVC-%Change-Post: 7 %
FVC-%Pred-Post: 64 %
FVC-%Pred-Pre: 59 %
FVC-Post: 2.82 L
FVC-Pre: 2.62 L
POST FEV1/FVC RATIO: 74 %
POST FEV6/FVC RATIO: 99 %
Pre FEV1/FVC ratio: 75 %
Pre FEV6/FVC Ratio: 98 %

## 2017-10-16 LAB — URINALYSIS, ROUTINE W REFLEX MICROSCOPIC
Bilirubin Urine: NEGATIVE
GLUCOSE, UA: NEGATIVE mg/dL
Hgb urine dipstick: NEGATIVE
KETONES UR: NEGATIVE mg/dL
LEUKOCYTES UA: NEGATIVE
Nitrite: NEGATIVE
Protein, ur: NEGATIVE mg/dL
Specific Gravity, Urine: 1.023 (ref 1.005–1.030)
pH: 5 (ref 5.0–8.0)

## 2017-10-16 LAB — HEMOGLOBIN A1C
Hgb A1c MFr Bld: 6.1 % — ABNORMAL HIGH (ref 4.8–5.6)
Mean Plasma Glucose: 128.37 mg/dL

## 2017-10-16 LAB — BLOOD GAS, ARTERIAL
Acid-Base Excess: 1.3 mmol/L (ref 0.0–2.0)
BICARBONATE: 25.1 mmol/L (ref 20.0–28.0)
DRAWN BY: 449841
FIO2: 21
O2 Saturation: 98 %
PATIENT TEMPERATURE: 98.6
PH ART: 7.442 (ref 7.350–7.450)
PO2 ART: 116 mmHg — AB (ref 83.0–108.0)
pCO2 arterial: 37.4 mmHg (ref 32.0–48.0)

## 2017-10-16 LAB — CBC
HEMATOCRIT: 36.1 % — AB (ref 39.0–52.0)
HEMOGLOBIN: 11.7 g/dL — AB (ref 13.0–17.0)
MCH: 27 pg (ref 26.0–34.0)
MCHC: 32.4 g/dL (ref 30.0–36.0)
MCV: 83.4 fL (ref 78.0–100.0)
Platelets: 238 10*3/uL (ref 150–400)
RBC: 4.33 MIL/uL (ref 4.22–5.81)
RDW: 16 % — ABNORMAL HIGH (ref 11.5–15.5)
WBC: 6.6 10*3/uL (ref 4.0–10.5)

## 2017-10-16 LAB — SURGICAL PCR SCREEN
MRSA, PCR: NEGATIVE
STAPHYLOCOCCUS AUREUS: POSITIVE — AB

## 2017-10-16 LAB — PROTIME-INR
INR: 1.01
Prothrombin Time: 13.2 seconds (ref 11.4–15.2)

## 2017-10-16 LAB — APTT: aPTT: 34 seconds (ref 24–36)

## 2017-10-16 MED ORDER — VANCOMYCIN HCL 10 G IV SOLR
1250.0000 mg | INTRAVENOUS | Status: AC
  Administered 2017-10-17: 1250 mg via INTRAVENOUS
  Filled 2017-10-16: qty 1250

## 2017-10-16 MED ORDER — KENNESTONE BLOOD CARDIOPLEGIA VIAL
13.0000 mL | Freq: Once | Status: DC
  Filled 2017-10-16: qty 13

## 2017-10-16 MED ORDER — ALBUTEROL SULFATE (2.5 MG/3ML) 0.083% IN NEBU
2.5000 mg | INHALATION_SOLUTION | Freq: Once | RESPIRATORY_TRACT | Status: AC
  Administered 2017-10-16: 2.5 mg via RESPIRATORY_TRACT

## 2017-10-16 MED ORDER — SODIUM CHLORIDE 0.9 % IV SOLN
30.0000 ug/min | INTRAVENOUS | Status: AC
  Administered 2017-10-17: 100 ug/min via INTRAVENOUS
  Filled 2017-10-16: qty 2

## 2017-10-16 MED ORDER — CEFUROXIME SODIUM 750 MG IJ SOLR
750.0000 mg | INTRAMUSCULAR | Status: DC
  Filled 2017-10-16: qty 750

## 2017-10-16 MED ORDER — NITROGLYCERIN IN D5W 200-5 MCG/ML-% IV SOLN
2.0000 ug/min | INTRAVENOUS | Status: AC
  Administered 2017-10-17: 16.6 ug/min via INTRAVENOUS
  Filled 2017-10-16: qty 250

## 2017-10-16 MED ORDER — PLASMA-LYTE 148 IV SOLN
INTRAVENOUS | Status: AC
  Administered 2017-10-17: 500 mL
  Filled 2017-10-16: qty 2.5

## 2017-10-16 MED ORDER — EPINEPHRINE PF 1 MG/ML IJ SOLN
0.0000 ug/min | INTRAVENOUS | Status: DC
  Filled 2017-10-16: qty 4

## 2017-10-16 MED ORDER — POTASSIUM CHLORIDE 2 MEQ/ML IV SOLN
80.0000 meq | INTRAVENOUS | Status: DC
  Filled 2017-10-16: qty 40

## 2017-10-16 MED ORDER — DEXMEDETOMIDINE HCL IN NACL 400 MCG/100ML IV SOLN
0.1000 ug/kg/h | INTRAVENOUS | Status: DC
  Filled 2017-10-16: qty 100

## 2017-10-16 MED ORDER — TRANEXAMIC ACID (OHS) PUMP PRIME SOLUTION
2.0000 mg/kg | INTRAVENOUS | Status: DC
  Filled 2017-10-16: qty 1.54

## 2017-10-16 MED ORDER — VANCOMYCIN HCL 1000 MG IV SOLR
INTRAVENOUS | Status: AC
  Administered 2017-10-17: 1000 mL
  Filled 2017-10-16: qty 1000

## 2017-10-16 MED ORDER — DOPAMINE-DEXTROSE 3.2-5 MG/ML-% IV SOLN
0.0000 ug/kg/min | INTRAVENOUS | Status: AC
  Administered 2017-10-17: 3 ug/kg/min via INTRAVENOUS
  Filled 2017-10-16: qty 250

## 2017-10-16 MED ORDER — KENNESTONE BLOOD CARDIOPLEGIA (KBC) MANNITOL SYRINGE (20%, 32ML)
32.0000 mL | Freq: Once | INTRAVENOUS | Status: DC
  Filled 2017-10-16: qty 32

## 2017-10-16 MED ORDER — CHLORHEXIDINE GLUCONATE 0.12 % MT SOLN
15.0000 mL | Freq: Once | OROMUCOSAL | Status: AC
  Administered 2017-10-17: 15 mL via OROMUCOSAL
  Filled 2017-10-16: qty 15

## 2017-10-16 MED ORDER — TRANEXAMIC ACID 1000 MG/10ML IV SOLN
1.5000 mg/kg/h | INTRAVENOUS | Status: AC
  Administered 2017-10-17: 1.5 mg/kg/h via INTRAVENOUS
  Filled 2017-10-16: qty 25

## 2017-10-16 MED ORDER — DEXTROSE 5 % IV SOLN
1.5000 g | INTRAVENOUS | Status: AC
  Administered 2017-10-17: 1.5 g via INTRAVENOUS
  Filled 2017-10-16 (×2): qty 1.5

## 2017-10-16 MED ORDER — MAGNESIUM SULFATE 50 % IJ SOLN
40.0000 meq | INTRAMUSCULAR | Status: DC
  Filled 2017-10-16: qty 9.85

## 2017-10-16 MED ORDER — METOPROLOL TARTRATE 12.5 MG HALF TABLET: 12.5000 mg | ORAL_TABLET | Freq: Once | ORAL | Status: DC

## 2017-10-16 MED ORDER — SODIUM CHLORIDE 0.9 % IV SOLN
INTRAVENOUS | Status: DC
  Filled 2017-10-16: qty 30

## 2017-10-16 MED ORDER — MILRINONE LACTATE IN DEXTROSE 20-5 MG/100ML-% IV SOLN
0.1250 ug/kg/min | INTRAVENOUS | Status: DC
  Filled 2017-10-16: qty 100

## 2017-10-16 MED ORDER — TRANEXAMIC ACID (OHS) BOLUS VIA INFUSION
15.0000 mg/kg | INTRAVENOUS | Status: AC
  Administered 2017-10-17: 1156.5 mg via INTRAVENOUS
  Filled 2017-10-16: qty 1157

## 2017-10-16 MED ORDER — SODIUM CHLORIDE 0.9 % IV SOLN
INTRAVENOUS | Status: AC
  Administered 2017-10-17: .8 [IU]/h via INTRAVENOUS
  Filled 2017-10-16: qty 1

## 2017-10-16 NOTE — Progress Notes (Addendum)
[address]       [place] [address]             [phone]     CARDIOTHORACIC SURGERY OFFICE NOTE  Referring Provider is Jens Som, Madolyn Frieze, MD PCP is Shirline Frees, NP   HPI:  Patient is a 67 year old male with history of aortic stenosis, coronary artery disease with multiple previous PCI and stents, hypertension, lupus, remote history of DVT with reported possible hypercoagulable state, GE reflux disease, Barrett's esophagus, and hyperlipidemia who returns to the office today with tentative plans to proceed with elective aortic valve replacement and coronary artery bypass grafting tomorrow.  The patient was originally seen in consultation while he was hospitalized following diagnostic cardiac catheterization on October 03, 2017.  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  He recovered uneventfully and was seen in follow-up recently by Dr. Darrick Penna at [facility] because of increased pain surrounding his right knee.  Follow-up duplex revealed thrombosed pseudoaneurysm and no DVT, and his pain was felt to be related to chronic orthopedic problems related to his right knee.  He returns to our office today and reports that he is looking forward to putting his cardiac surgery behind him.  He states that since hospital discharge she had one brief episode of chest discomfort for which he took sublingual nitroglycerin.  He has otherwise not had any other episodes of chest pain.  He denies any shortness of breath although he admits that he has not been very active because of significantly increased pain involving his right knee.  The remainder of his review of systems is unchanged from previously.   Current Outpatient Medications  Medication Sig Dispense Refill  . acetaminophen (TYLENOL) 325 MG tablet Take 2 tablets (650 mg total) by mouth every 4 (four)  hours as needed for headache or mild pain.    Marland Kitchen amLODipine (NORVASC) 5 MG tablet TAKE ONE TABLET BY MOUTH DAILY (Patient taking differently: Take 5mg  by mouth every am) 90 tablet 6  . aspirin 81 MG chewable tablet Chew 1 tablet (81 mg total) by mouth daily.    Marland Kitchen atorvastatin (LIPITOR) 80 MG tablet Take 1 tablet (80 mg total) by mouth daily at 6 PM. 90 tablet 3  . Azelastine HCl 0.15 % SOLN USE 2 SPRAYS BY NASAL ROUTE AS NEEDED FOR CONGESTION    . brimonidine (ALPHAGAN) 0.15 % ophthalmic solution Place 1 drop into both eyes 2 (two) times daily.    . calcium carbonate (TUMS - DOSED IN MG ELEMENTAL CALCIUM) 500 MG chewable tablet Chew 0.5 tablets by mouth as needed for indigestion or heartburn.    . chlorhexidine (HIBICLENS) 4 % external liquid Apply 30 mLs (2 application total) topically as directed. 120 mL 0  . Emollient (GOLD BOND ULTIMATE HEALING) CREA Apply 1 application topically daily as needed (wound care).    . Flaxseed, Linseed, (FLAX SEED OIL) 1000 MG CAPS Take 1,000 mg by mouth 2 (two) times daily.    Marland Kitchen ibuprofen (ADVIL,MOTRIN) 200 MG tablet Take 200-400 mg by mouth 2 (two) times daily as needed for headache or moderate pain.    . isosorbide mononitrate (IMDUR) 120 MG 24 hr tablet TAKE ONE TABLET BY MOUTH DAILY 90 tablet 1  . loperamide (IMODIUM A-D) 2 MG tablet Take 2-4 mg by mouth 2 (two) times daily as needed for diarrhea  or loose stools.    . metoprolol tartrate (LOPRESSOR) 25 MG tablet TAKE 0.5 TABLETS BY MOUTH 2 TIMES DAILY 60 tablet 12  . NASONEX 50 MCG/ACT nasal spray Place 2 sprays into the nose daily as needed (allergies).     . nitroGLYCERIN (NITROSTAT) 0.4 MG SL tablet Place 1 tablet (0.4 mg total) under the tongue every 5 (five) minutes as needed for chest pain. 25 tablet 3  . Omega-3 Fatty Acids (FISH OIL PO) Take 2 capsules by mouth daily.    . pantoprazole (PROTONIX) 40 MG tablet Take 1 tablet (40 mg total) by mouth daily. 90 tablet 3  . Probiotic CAPS Take 1 capsule by  mouth daily.    . ramipril (ALTACE) 5 MG capsule TAKE ONE CAPSULE BY MOUTH DAILY 90 capsule 2  . timolol (BETIMOL) 0.5 % ophthalmic solution Place 1 drop into both eyes 2 (two) times daily.    . Turmeric Curcumin 500 MG CAPS Take 500 mg by mouth 2 (two) times daily.    Marland Kitchen enoxaparin (LOVENOX) 120 MG/0.8ML injection Inject 0.8 mLs (120 mg total) into the skin daily for 10 days. Q 6pm. Last dose 10/15/17 11 Syringe 0   No current facility-administered medications for this visit.    Facility-Administered Medications Ordered in Other Visits  Medication Dose Route Frequency Provider Last Rate Last Dose  . [START ON 10/17/2017] cefUROXime (ZINACEF) 1.5 g in dextrose 5 % 50 mL IVPB  1.5 g Intravenous To OR Purcell Nails, MD      . Melene Muller ON 10/17/2017] cefUROXime (ZINACEF) 750 mg in dextrose 5 % 50 mL IVPB  750 mg Intravenous To OR Purcell Nails, MD      . Melene Muller ON 10/17/2017] chlorhexidine (PERIDEX) 0.12 % solution 15 mL  15 mL Mouth/Throat Once Purcell Nails, MD      . Melene Muller ON 10/17/2017] dexmedetomidine (PRECEDEX) 400 MCG/100ML (4 mcg/mL) infusion  0.1-0.7 mcg/kg/hr Intravenous To OR Purcell Nails, MD      . Melene Muller ON 10/17/2017] DOPamine (INTROPIN) 800 mg in dextrose 5 % 250 mL (3.2 mg/mL) infusion  0-10 mcg/kg/min Intravenous To OR Purcell Nails, MD      . Melene Muller ON 10/17/2017] EPINEPHrine (ADRENALIN) 4 mg in dextrose 5 % 250 mL (0.016 mg/mL) infusion  0-10 mcg/min Intravenous To OR Purcell Nails, MD      . Melene Muller ON 10/17/2017] heparin 2,500 Units, papaverine 30 mg in electrolyte-148 (PLASMALYTE-148) 500 mL irrigation   Irrigation To OR Purcell Nails, MD      . Melene Muller ON 10/17/2017] heparin 30,000 units/NS 1000 mL solution for CELLSAVER   Other To OR Purcell Nails, MD      . Melene Muller ON 10/17/2017] insulin regular (NOVOLIN R,HUMULIN R) 100 Units in sodium chloride 0.9 % 100 mL (1 Units/mL) infusion   Intravenous To OR Purcell Nails, MD      . Melene Muller ON 10/17/2017] [place] Blood Cardioplegia  (KBC) lidocaine 2% Syringe (13mL)  13 mL Intracoronary Once Purcell Nails, MD      . Melene Muller ON 10/17/2017] [place] Blood Cardioplegia (KBC) lidocaine 2% Syringe (13mL)  13 mL Intracoronary Once Purcell Nails, MD      . Melene Muller ON 10/17/2017] [place] Blood Cardioplegia (KBC) mannitol 20% Syringe (32mL)  32 mL Intracoronary Once Purcell Nails, MD      . Melene Muller ON 10/17/2017] [place] Blood Cardioplegia (KBC) mannitol 20% Syringe (32mL)  32 mL Intracoronary Once Purcell Nails, MD      . [  START ON 10/17/2017] magnesium sulfate (IV Push/IM) injection 40 mEq  40 mEq Other To OR Purcell Nails, MD      . Melene Muller ON 10/17/2017] metoprolol tartrate (LOPRESSOR) tablet 12.5 mg  12.5 mg Oral Once Purcell Nails, MD      . milrinone (PRIMACOR) 20 MG/100 ML (0.2 mg/mL) infusion  0.125 mcg/kg/min Intravenous To OR Purcell Nails, MD      . Melene Muller ON 10/17/2017] nitroGLYCERIN 50 mg in dextrose 5 % 250 mL (0.2 mg/mL) infusion  2-200 mcg/min Intravenous To OR Purcell Nails, MD      . Melene Muller ON 10/17/2017] phenylephrine (NEO-SYNEPHRINE) 20 mg in sodium chloride 0.9 % 250 mL (0.08 mg/mL) infusion  30-200 mcg/min Intravenous To OR Purcell Nails, MD      . Melene Muller ON 10/17/2017] potassium chloride injection 80 mEq  80 mEq Other To OR Purcell Nails, MD      . Melene Muller ON 10/17/2017] tranexamic acid (CYKLOKAPRON) 2,500 mg in sodium chloride 0.9 % 250 mL (10 mg/mL) infusion  1.5 mg/kg/hr Intravenous To OR Purcell Nails, MD      . Melene Muller ON 10/17/2017] tranexamic acid (CYKLOKAPRON) bolus via infusion - over 30 minutes 1,156.5 mg  15 mg/kg Intravenous To OR Purcell Nails, MD      . Melene Muller ON 10/17/2017] tranexamic acid (CYKLOKAPRON) pump prime solution 154 mg  2 mg/kg Intracatheter To OR Purcell Nails, MD      . Melene Muller ON 10/17/2017] vancomycin (VANCOCIN) 1,000 mg in sodium chloride 0.9 % 1,000 mL irrigation   Irrigation To OR Purcell Nails, MD      . Melene Muller ON 10/17/2017] vancomycin (VANCOCIN) 1,250 mg in sodium  chloride 0.9 % 250 mL IVPB  1,250 mg Intravenous To OR Purcell Nails, MD          Physical Exam:   BP 105/63 (BP Location: Right Arm, Patient Position: Sitting, Cuff Size: Normal)   Pulse 73   Resp 18   Ht 5\' 9"  (1.753 m)   Wt 165 lb (74.8 kg)   SpO2 98% Comment: on RA  BMI 24.37 kg/m   General:  Well-appearing  Chest:   Clear to auscultation  CV:   Regular rate and rhythm with prominent systolic murmur  Incisions:  n/a  Abdomen:  Soft nontender  Extremities:  Warm and well perfused, no edema  Diagnostic Tests:  n/a   Impression:  Patient has stage D severe symptomatic aortic stenosis and severe multivessel coronary artery disease.  He presents with accelerating symptoms of exertional angina and shortness of breath, functional class III.    Last week he had one brief episode of chest discomfort at rest relieved by nitroglycerin, consistent with unstable angina.  I have personally reviewed the patient's most recent follow-up transthoracic echocardiogram and the diagnostic cardiac catheterization performed October 02, 2017.  Echocardiogram reveals severe aortic stenosis with normal left ventricular systolic function.  The aortic valve is trileaflet.  There is severe thickening, calcification, and restricted leaflet mobility involving all 3 leaflets of the aortic valve.  Peak velocity across the aortic valve ranged as high as 4.0 m/s corresponding to mean transvalvular gradient estimated 38 mmHg.  The DVI was reported 0.19.  Diagnostic cardiac catheterization revealed severe multivessel coronary artery disease as outlined previously.  There is also significant calcification in the ascending thoracic aorta.  I agree the patient would best be treated with aortic valve replacement and coronary artery bypass grafting.  Risks associated  with conventional surgery will be somewhat elevated because of the patient's numerous comorbid medical problems, and potentially by disease involving the  ascending thoracic aorta.    In addition, his postoperative convalescence may be affected by limited mobility due to increased pain in his right knee.     Plan:  The patient and his wife were again counseled at length regarding treatment alternatives for management of severe aortic stenosis and coronary artery disease.  The indications, risks and potential benefits of surgery were reviewed at length.  Expectations regarding his postoperative convalescence were discussed.  Discussion was held comparing the relative risks of mechanical valve replacement with need for lifelong anticoagulation versus use of a bioprosthetic tissue valve and the associated potential for late structural valve deterioration and failure.  This discussion was placed in the context of the patient's particular circumstances, and as a result the patient specifically requests that their valve be replaced using a bioprosthetic tissue valve.   The potential advantages and disadvantages associated with use of a rapid-deployment bioprosthetic aortic valve were discussed, including the risks of paravalvular leak, need for permanent pacemaker placement, and expectations for long-term durability.  The patient understands and accepts all potential associated risks of surgery including but not limited to risk of death, stroke, myocardial infarction, congestive heart failure, respiratory failure, renal failure, pneumonia, bleeding requiring blood transfusion and or reexploration, arrhythmia, heart block or bradycardia requiring permanent pacemaker, aortic dissection or other major vascular complication, pleural effusions or other delayed complications related to continued congestive heart failure, late recurrence of symptomatic ischemic heart disease, and other late complications related to valve replacement including structural valve deterioration and failure, thrombosis, endocarditis, or paravalvular leak.  All questions answered.   I spent  in excess of 15 minutes during the conduct of this office consultation and >50% of this time involved direct face-to-face encounter with the patient for counseling and/or coordination of their care.    Salvatore Decentlarence H. Cornelius Moraswen, MD 10/16/2017 3:57 PM

## 2017-10-16 NOTE — Patient Instructions (Signed)
Stop taking lovenox shots  Continue taking all other medications without change through the day before surgery.  Have nothing to eat or drink after midnight the night before surgery.  On the morning of surgery take only metoprolol and Protonix with a sip of water.  You may use your eye drops.

## 2017-10-16 NOTE — Telephone Encounter (Signed)
Patient is no longer taking this medication.  Message sent to the pharmacy to discontinue.

## 2017-10-16 NOTE — Pre-Procedure Instructions (Signed)
HUBER MATHERS  10/16/2017      [facility] [place], [place] - [address] [place] [address] Phone: [phone] Fax: [phone]    Your procedure is scheduled on Jan 3.  Report to [facility] Admitting at 530 A.M.  Call this number if you have problems the morning of surgery:  [phone]   Remember:  Do not eat food or drink liquids after midnight.  Take these medicines the morning of surgery with A SIP OF WATER eye drops, Isosorbide mononitrate (Imdur), Metoprolol Tartrate (Lopressor), Nitrostat if needed, Pantoprazole (Protonix)  Stop/take aspirin as directed by your Dr.   Oneita Hurt per dr  Despina Arias all herbel meds, nsaids (aleve,naproxen,advil,ibuprofen)   prior to surgery starting now( if not already stopped) including all vitamins/supplements probiotic , fish oil,tumeric,flaxseed     Do not wear jewelry, make-up or nail polish.  Do not wear lotions, powders, or perfumes, or deodorant.  Do not shave 48 hours prior to surgery.  Men may shave face and neck.  Do not bring valuables to the hospital.  [facility] is not responsible for any belongings or valuables.  Contacts, dentures or bridgework may not be worn into surgery.  Leave your suitcase in the car.  After surgery it may be brought to your room.  For patients admitted to the hospital, discharge time will be determined by your treatment team.  Patients discharged the day of surgery will not be allowed to drive home.   Special instructions:  [facility] - Preparing for Surgery  Before surgery, you can play an important role.  Because skin is not sterile, your skin needs to be as free of germs as possible.  You can reduce the number of germs on you skin by washing with CHG (chlorahexidine gluconate) soap before surgery.  CHG is an antiseptic cleaner which kills germs and bonds with the skin to continue killing germs even after washing.  Please DO NOT use  if you have an allergy to CHG or antibacterial soaps.  If your skin becomes reddened/irritated stop using the CHG and inform your nurse when you arrive at Short Stay.  Do not shave (including legs and underarms) for at least 48 hours prior to the first CHG shower.  You may shave your face.  Please follow these instructions carefully:   1.  Shower with CHG Soap the night before surgery and the  morning of Surgery.  2.  If you choose to wash your hair, wash your hair first as usual with your  normal shampoo.  3.  After you shampoo, rinse your hair and body thoroughly to remove the Shampoo.  4.  Use CHG as you would any other liquid soap.  You can apply chg directly  to the skin and wash gently with scrungie or a clean washcloth.  5.  Apply the CHG Soap to your body ONLY FROM THE NECK DOWN.  Do not use on open wounds or open sores.  Avoid contact with your eyes,  ears, mouth and genitals (private parts).  Wash genitals (private parts)   with your normal soap.  6.  Wash thoroughly, paying special attention to the area where your surgery will be performed.  7.  Thoroughly rinse your body with warm water from the neck down.  8.  DO NOT shower/wash with your normal soap after using and rinsing off   the CHG Soap.  9.  Pat yourself dry  with a clean towel.            10.  Wear clean pajamas.            11.  Place clean sheets on your bed the night of your first shower and do not sleep with pets.  Day of Surgery  Do not apply any lotions/deoderants the morning of surgery.  Please wear clean clothes to the hospital/surgery center.     Please read over the  fact sheets that you were given.

## 2017-10-16 NOTE — Pre-Procedure Instructions (Signed)
HUBER MATHERS  10/16/2017      [facility] [place], [place] - [address] [place] [address] Phone: [phone] Fax: [phone]    Your procedure is scheduled on Jan 3.  Report to [facility] Admitting at 530 A.M.  Call this number if you have problems the morning of surgery:  [phone]   Remember:  Do not eat food or drink liquids after midnight.  Take these medicines the morning of surgery with A SIP OF WATER eye drops, Isosorbide mononitrate (Imdur), Metoprolol Tartrate (Lopressor), Nitrostat if needed, Pantoprazole (Protonix)  Stop/take aspirin as directed by your Dr.   Oneita Hurt per dr  Despina Arias all herbel meds, nsaids (aleve,naproxen,advil,ibuprofen)   prior to surgery starting now( if not already stopped) including all vitamins/supplements probiotic , fish oil,tumeric,flaxseed     Do not wear jewelry, make-up or nail polish.  Do not wear lotions, powders, or perfumes, or deodorant.  Do not shave 48 hours prior to surgery.  Men may shave face and neck.  Do not bring valuables to the hospital.  [facility] is not responsible for any belongings or valuables.  Contacts, dentures or bridgework may not be worn into surgery.  Leave your suitcase in the car.  After surgery it may be brought to your room.  For patients admitted to the hospital, discharge time will be determined by your treatment team.  Patients discharged the day of surgery will not be allowed to drive home.   Special instructions:   - Preparing for Surgery  Before surgery, you can play an important role.  Because skin is not sterile, your skin needs to be as free of germs as possible.  You can reduce the number of germs on you skin by washing with CHG (chlorahexidine gluconate) soap before surgery.  CHG is an antiseptic cleaner which kills germs and bonds with the skin to continue killing germs even after washing.  Please DO NOT use  if you have an allergy to CHG or antibacterial soaps.  If your skin becomes reddened/irritated stop using the CHG and inform your nurse when you arrive at Short Stay.  Do not shave (including legs and underarms) for at least 48 hours prior to the first CHG shower.  You may shave your face.  Please follow these instructions carefully:   1.  Shower with CHG Soap the night before surgery and the  morning of Surgery.  2.  If you choose to wash your hair, wash your hair first as usual with your  normal shampoo.  3.  After you shampoo, rinse your hair and body thoroughly to remove the Shampoo.  4.  Use CHG as you would any other liquid soap.  You can apply chg directly  to the skin and wash gently with scrungie or a clean washcloth.  5.  Apply the CHG Soap to your body ONLY FROM THE NECK DOWN.  Do not use on open wounds or open sores.  Avoid contact with your eyes,  ears, mouth and genitals (private parts).  Wash genitals (private parts)   with your normal soap.  6.  Wash thoroughly, paying special attention to the area where your surgery will be performed.  7.  Thoroughly rinse your body with warm water from the neck down.  8.  DO NOT shower/wash with your normal soap after using and rinsing off   the CHG Soap.  9.  Pat yourself dry  with a clean towel.            10.  Wear clean pajamas.            11.  Place clean sheets on your bed the night of your first shower and do not sleep with pets.  Day of Surgery  Do not apply any lotions/deoderants the morning of surgery.  Please wear clean clothes to the hospital/surgery center.     Please read over the  fact sheets that you were given.

## 2017-10-16 NOTE — Anesthesia Preprocedure Evaluation (Signed)
Anesthesia Evaluation  Patient identified by MRN, date of birth, ID band Patient awake    Reviewed: Allergy & Precautions, NPO status , Patient's Chart, lab work & pertinent test results  Airway Mallampati: II  TM Distance: >3 FB Neck ROM: Full    Dental no notable dental hx.    Pulmonary shortness of breath, pneumonia,    Pulmonary exam normal breath sounds clear to auscultation       Cardiovascular hypertension, + angina + CAD, + Cardiac Stents and + Peripheral Vascular Disease  Normal cardiovascular exam+ Valvular Problems/Murmurs AS  Rhythm:Regular Rate:Normal  - Left ventricle: The cavity size was normal. Wall thickness was  increased in a pattern of mild LVH. Systolic function was normal. The estimated ejection fraction was in the range of 55% to 60%. Wall motion was normal; there were no regional wall motion abnormalities. Left ventricular diastolic function parameters  were normal. - Aortic valve: Valve mobility was restricted. There was moderate to severe stenosis. There was trivial regurgitation. - Mitral valve: There was mild regurgitation. - Left atrium: The atrium was moderately dilated. - Right ventricle: The cavity size was mildly dilated. - Right atrium: The atrium was mildly dilated. - Tricuspid valve: There was mild-moderate regurgitation.  Impressions: - Normal LV systolic function; mild LVH; calcified aortic valve with moderate to severe AS (mean gradient 38 mmHg) and trace AI; mild MR; modlerate LAE; mild RAE and RVE; mild to moderate TR.   Neuro/Psych negative neurological ROS  negative psych ROS   GI/Hepatic Neg liver ROS, hiatal hernia, GERD  ,  Endo/Other  negative endocrine ROS  Renal/GU negative Renal ROS  negative genitourinary   Musculoskeletal  (+) Arthritis ,   Abdominal   Peds negative pediatric ROS (+)  Hematology  (+) anemia ,   Anesthesia Other Findings    Reproductive/Obstetrics                            Anesthesia Physical Anesthesia Plan  ASA: IV  Anesthesia Plan: General   Post-op Pain Management:    Induction: Intravenous  PONV Risk Score and Plan: 3 and Treatment may vary due to age or medical condition  Airway Management Planned: Oral ETT  Additional Equipment: Arterial line, CVP, PA Cath, TEE and Ultrasound Guidance Line Placement  Intra-op Plan:   Post-operative Plan: Post-operative intubation/ventilation  Informed Consent: I have reviewed the patients History and Physical, chart, labs and discussed the procedure including the risks, benefits and alternatives for the proposed anesthesia with the patient or authorized representative who has indicated his/her understanding and acceptance.   Dental advisory given  Plan Discussed with: CRNA  Anesthesia Plan Comments:         Anesthesia Quick Evaluation

## 2017-10-16 NOTE — H&P (Signed)
[address]       [place] [address]             [phone]          CARDIOTHORACIC SURGERY HISTORY AND PHYSICAL EXAM  PCP is Shirline Frees, NP Referring Provider is Olga Millers, MD Primary Cardiologist is Rollene Rotunda, MD  Reason for consultation:  Severe aortic stenosis and coronary artery disease  HPI:  Patient is a 67 year old male with history of aortic stenosis, coronary artery disease with multiple previous PCI and stents, hypertension, lupus, remote history of DVT with reported hypercoagulable state on long-term warfarin anticoagulation, GE reflux disease, Barrett's esophagus, and hyperlipidemia who has been referred for surgical consultation to discuss treatment options for management of severe aortic stenosis and multivessel coronary artery disease.  Patient has a long history of coronary artery disease dating back several years.  He had reported history of PCI and stenting of the obtuse marginal branch of the left circumflex coronary artery, left anterior descending coronary artery, and the right coronary artery, all of which were performed at [facility].  Approximately 4 years ago the patient moved to [facility] where he has been followed by Dr. Antoine Poche.  Catheterization in May 2016 revealed patent stents in the left anterior descending coronary artery with 70% in-stent stenosis, and patent stents in both the left circumflex and the right coronary distributions.  He was treated medically.  He has known history of heart murmur and previous echocardiograms have demonstrated the presence of mild aortic stenosis with normal left ventricular systolic function.  Follow-up echocardiogram performed July 18, 2017 revealed significant progression of disease with moderate to severe aortic stenosis.  Peak velocity across the aortic valve range from 3.2-4.0 m/s corresponding to mean transvalvular gradient estimated 38 mmHg.  The DVI was reported  0.19.  Medical therapy was recommended.    The patient states that he has developed progressive symptoms of fatigue and over the past month classical symptoms of angina pectoris.  He states that initially in the morning he feels well and he usually goes to the [facility] to exercise on a regular basis.  By the end of the afternoon he gets fatigued easily and developed substernal chest tightness.  Symptoms have gotten much worse over the past month.  Following the recent snowstorm he was carrying a pile of wood to his house and developed severe substernal chest pain for which she took nitroglycerin.  The chest pain finally resolved.  Since then he has had more frequent episodes of chest pain and chest tightness with moderate and low-level activity.  He was seen in the office on September 26, 2017 by Theodore Demark and scheduled for elective outpatient catheterization on October 02, 2017.  Catheterization revealed severe multivessel coronary artery disease including 80% ostial stenosis of the left anterior descending coronary artery, 80% stenosis of the mid left anterior descending coronary artery, ostial left circumflex stenosis, and 95% in-stent restenosis in the mid right coronary artery.  The patient was hospitalized and cardiothoracic surgical consultation requested.  He has developed a pseudoaneurysm of the right femoral artery following catheterization and was evaluated by Dr. Randie Heinz earlier today.  Injection of the pseudoaneurysm has been planned for tomorrow.  The patient is married and lives locally in [place] with his wife.  He has been disabled for approximately 10 years secondary to his numerous medical problems.  He has remained functionally independent and reasonably active until recently.  He describes a long  history of exertional chest tightness but symptoms have progressed dramatically over the past month.  He gets short of breath with exertion.  He denies resting shortness of breath, PND,  orthopnea, or lower extremity edema.  He reports occasional dizzy spells without history of syncope.  His mobility is slightly limited secondary to degenerative arthritis in his right knee.  Patient is a 67 year old male with history of aortic stenosis, coronary artery disease with multiple previous PCI and stents, hypertension, lupus, remote history of DVT with reported possible hypercoagulable state, GE reflux disease, Barrett's esophagus, and hyperlipidemia who returns to the office today with tentative plans to proceed with elective aortic valve replacement and coronary artery bypass grafting tomorrow.  The patient was originally seen in consultation while he was hospitalized following diagnostic cardiac catheterization on October 03, 2017.  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  He recovered uneventfully and was seen in follow-up recently by Dr. Darrick Penna at [facility] because of increased pain surrounding his right knee.  Follow-up duplex revealed thrombosed pseudoaneurysm and no DVT, and his pain was felt to be related to chronic orthopedic problems related to his right knee.  He returns to our office today and reports that he is looking forward to putting his cardiac surgery behind him.  He states that since hospital discharge she had one brief episode of chest discomfort for which he took sublingual nitroglycerin.  He has otherwise not had any other episodes of chest pain.  He denies any shortness of breath although he admits that he has not been very active because of significantly increased pain involving his right knee.  The remainder of his review of systems is unchanged from previously.  Past Medical History:  Diagnosis Date  . Anemia   . AS (aortic stenosis)    Mild  . Barrett's esophagus 11/11/2014  . CAD S/P percutaneous coronary angioplasty    7 stents per the  patient (no record).  Stent to 90% inferior branch of an OM and mild scattered plaque in other vessels [facility]  01/08/13.  Cath 2016 with patent stents. He did have 70% stenosis in a small LAD that was about 2 mm. Size  . Coronary artery disease involving coronary bypass graft of native heart with angina pectoris (HCC) 06/27/2017  . Essential hypertension   . GERD (gastroesophageal reflux disease)   . Glaucoma   . Heart murmur   . History of hiatal hernia   . Hypercoagulable state (HCC)   . Hyperlipidemia   . Joint pain   . Lupus   . Peripheral vascular disease (HCC)    PE  dvt   hx 12-14 yrs  . Pneumonia    hx  . Pneumonitis    Lupus  . Severe aortic stenosis   . Unstable angina (HCC) 09/27/2017  . Uses hearing aid     Past Surgical History:  Procedure Laterality Date  . CARDIAC CATHETERIZATION N/A 04/01/2015   Procedure: Left Heart Cath and Coronary Angiography;  Surgeon: Marykay Lex, MD;  Location: [facility] INVASIVE CV LAB;  Service: Cardiovascular;  Laterality: N/A;  . CATARACT EXTRACTION     x 2  . COLONOSCOPY    . EYE SURGERY    . KNEE SURGERY Right    x2 tendon  . NASAL SINUS SURGERY    . TONSILLECTOMY AND ADENOIDECTOMY    . UPPER GASTROINTESTINAL ENDOSCOPY  Family History  Problem Relation Age of Onset  . Diabetes Mother   . Hypertension Mother   . Arthritis Mother   . Diabetes Father   . Hypertension Father   . Arthritis Father   . CAD Father 40       died age 5 of MI    Social History Social History   Tobacco Use  . Smoking status: Never Smoker  . Smokeless tobacco: Never Used  Substance Use Topics  . Alcohol use: Yes    Comment: 2 beers month?  . Drug use: No    Prior to Admission medications   Medication Sig Start Date End Date Taking? Authorizing Provider  acetaminophen (TYLENOL) 325 MG tablet Take 2 tablets (650 mg total) by mouth every 4 (four) hours as needed for headache or mild pain. 10/04/17   Abelino Derrick, PA-C  amLODipine  (NORVASC) 5 MG tablet TAKE ONE TABLET BY MOUTH DAILY Patient taking differently: Take 5mg  by mouth every am 07/03/17   Rollene Rotunda, MD  aspirin 81 MG chewable tablet Chew 1 tablet (81 mg total) by mouth daily. 10/05/17   Abelino Derrick, PA-C  atorvastatin (LIPITOR) 80 MG tablet Take 1 tablet (80 mg total) by mouth daily at 6 PM. 10/04/17   Kilroy, Eda Paschal, PA-C  Azelastine HCl 0.15 % SOLN USE 2 SPRAYS BY NASAL ROUTE AS NEEDED FOR CONGESTION 03/21/17   [provider]  brimonidine (ALPHAGAN) 0.15 % ophthalmic solution Place 1 drop into both eyes 2 (two) times daily.    [provider]  calcium carbonate (TUMS - DOSED IN MG ELEMENTAL CALCIUM) 500 MG chewable tablet Chew 0.5 tablets by mouth as needed for indigestion or heartburn.    [provider]  chlorhexidine (HIBICLENS) 4 % external liquid Apply 30 mLs (2 application total) topically as directed. 10/05/17   Barrett, Joline Salt, PA-C  Emollient (GOLD BOND ULTIMATE HEALING) CREA Apply 1 application topically daily as needed (wound care).    [provider]  enoxaparin (LOVENOX) 120 MG/0.8ML injection Inject 0.8 mLs (120 mg total) into the skin daily for 10 days. Q 6pm. Last dose 10/15/17 10/05/17 10/15/17  Barrett, Joline Salt, PA-C  Flaxseed, Linseed, (FLAX SEED OIL) 1000 MG CAPS Take 1,000 mg by mouth 2 (two) times daily.    [provider]  ibuprofen (ADVIL,MOTRIN) 200 MG tablet Take 200-400 mg by mouth 2 (two) times daily as needed for headache or moderate pain.    [provider]  isosorbide mononitrate (IMDUR) 120 MG 24 hr tablet TAKE ONE TABLET BY MOUTH DAILY 10/07/17   Rollene Rotunda, MD  loperamide (IMODIUM A-D) 2 MG tablet Take 2-4 mg by mouth 2 (two) times daily as needed for diarrhea or loose stools.    [provider]  metoprolol tartrate (LOPRESSOR) 25 MG tablet TAKE 0.5 TABLETS BY MOUTH 2 TIMES DAILY 07/29/17   Rollene Rotunda, MD  NASONEX 50 MCG/ACT nasal spray Place 2 sprays into  the nose daily as needed (allergies).  12/29/13   [provider]  nitroGLYCERIN (NITROSTAT) 0.4 MG SL tablet Place 1 tablet (0.4 mg total) under the tongue every 5 (five) minutes as needed for chest pain. 09/26/17 12/25/17  Barrett, Joline Salt, PA-C  Omega-3 Fatty Acids (FISH OIL PO) Take 2 capsules by mouth daily.    [provider]  pantoprazole (PROTONIX) 40 MG tablet Take 1 tablet (40 mg total) by mouth daily. 10/05/17   Abelino Derrick, PA-C  Probiotic CAPS Take 1  capsule by mouth daily.    [provider]  ramipril (ALTACE) 5 MG capsule TAKE ONE CAPSULE BY MOUTH DAILY 06/25/17   Nafziger, Kandee Keen, NP  timolol (BETIMOL) 0.5 % ophthalmic solution Place 1 drop into both eyes 2 (two) times daily.    [provider]  Turmeric Curcumin 500 MG CAPS Take 500 mg by mouth 2 (two) times daily.    [provider]    Allergies  Allergen Reactions  . Sulfa Antibiotics Other (See Comments)    Makes patient jittery, prefers not to take them.    Transthoracic Echocardiography  Patient: Rondal, Vandevelde MR #: [id number] Study Date: 07/18/2017 Gender: M Age: 42 Height: 172.7 cm Weight: 76.5 kg BSA: 1.93 m^2 Pt. Status: Room:  ATTENDING Rollene Rotunda, MD ORDERING Rollene Rotunda, MD REFERRING Rollene Rotunda, MD SONOGRAPHER Aida Raider, RDCS PERFORMING [facility], Outpatient  cc:  ------------------------------------------------------------------- LV EF: 55% - 60%  ------------------------------------------------------------------- Indications: I35.9 Aortic Valve Disorder.  ------------------------------------------------------------------- History: PMH: Acquired from the patient and from the patient&'s chart. PMH: Coronary artery disease. End Stage Renal Disease. Aortic Stenosis. Risk factors: Hypertension.  Dyslipidemia.  ------------------------------------------------------------------- Study Conclusions  - Left ventricle: The cavity size was normal. Wall thickness was increased in a pattern of mild LVH. Systolic function was normal. The estimated ejection fraction was in the range of 55% to 60%. Wall motion was normal; there were no regional wall motion abnormalities. Left ventricular diastolic function parameters were normal. - Aortic valve: Valve mobility was restricted. There was moderate to severe stenosis. There was trivial regurgitation. - Mitral valve: There was mild regurgitation. - Left atrium: The atrium was moderately dilated. - Right ventricle: The cavity size was mildly dilated. - Right atrium: The atrium was mildly dilated. - Tricuspid valve: There was mild-moderate regurgitation.  Impressions:  - Normal LV systolic function; mild LVH; calcified aortic valve with moderate to severe AS (mean gradient 38 mmHg) and trace AI; mild MR; modlerate LAE; mild RAE and RVE; mild to moderate TR.  ------------------------------------------------------------------- Study data: Study status: Routine. Procedure: The patient reported no pain pre or post test. Transthoracic echocardiography for left ventricular function evaluation, for right ventricular function evaluation, and for assessment of valvular function. Image quality was adequate. Study completion: There were no complications. Transthoracic echocardiography. M-mode, complete 2D, spectral Doppler, and color Doppler. Birthdate: Patient birthdate: 12/05/1950. Age: Patient is 67 yr old. Sex: Gender: male. BMI: 25.6 kg/m^2. Blood pressure: 140/90 Patient status: Outpatient. Study date: Study date: 07/18/2017. Study time: 10:41 AM. Location: [facility]  3  -------------------------------------------------------------------  ------------------------------------------------------------------- Left ventricle: The cavity size was normal. Wall thickness was increased in a pattern of mild LVH. Systolic function was normal. The estimated ejection fraction was in the range of 55% to 60%. Wall motion was normal; there were no regional wall motion abnormalities. Left ventricular diastolic function parameters were normal.  ------------------------------------------------------------------- Aortic valve: Trileaflet; moderately calcified leaflets. Valve mobility was restricted. Doppler: There was moderate to severe stenosis. There was trivial regurgitation. VTI ratio of LVOT to aortic valve: 0.19. Valve area (VTI): 0.93 cm^2. Indexed valve area (VTI): 0.48 cm^2/m^2. Peak velocity ratio of LVOT to aortic valve: 0.18. Valve area (Vmax): 0.91 cm^2. Indexed valve area (Vmax): 0.47 cm^2/m^2. Mean velocity ratio of LVOT to aortic valve: 0.19. Valve area (Vmean): 0.94 cm^2. Indexed valve area (Vmean): 0.49 cm^2/m^2. Mean gradient (S): 38 mm Hg. Peak gradient (S): 54 mm Hg.  ------------------------------------------------------------------- Aorta: Aortic root: The aortic root was normal in size.  ------------------------------------------------------------------- Mitral valve:  Mildly thickened leaflets . Mobility was not restricted. Doppler: Transvalvular velocity was within the normal range. There was no evidence for stenosis. There was mild regurgitation. Peak gradient (D): 3 mm Hg.  ------------------------------------------------------------------- Left atrium: The atrium was moderately dilated.  ------------------------------------------------------------------- Right ventricle: The cavity size was mildly dilated. Systolic function was  normal.  ------------------------------------------------------------------- Pulmonic valve: Doppler: Transvalvular velocity was within the normal range. There was no evidence for stenosis. There was trivial regurgitation.  ------------------------------------------------------------------- Tricuspid valve: Structurally normal valve. Doppler: Transvalvular velocity was within the normal range. There was mild-moderate regurgitation.  ------------------------------------------------------------------- Right atrium: The atrium was mildly dilated.  ------------------------------------------------------------------- Pericardium: There was no pericardial effusion.  ------------------------------------------------------------------- Systemic veins: Inferior vena cava: The vessel was normal in size.  ------------------------------------------------------------------- Measurements  Left ventricle Value Reference LV ID, ED, PLAX chordal 47.2 mm 43 - 52 LV ID, ES, PLAX chordal 33.2 mm 23 - 38 LV fx shortening, PLAX chordal 30 % >=29 LV PW thickness, ED 13.2 mm --------- IVS/LV PW ratio, ED 0.93 <=1.3 Stroke volume, 2D 82 ml --------- Stroke volume/bsa, 2D 43 ml/m^2 --------- LV e&', lateral 11.5 cm/s --------- LV E/e&', lateral 7.04 --------- LV e&', medial 9.98 cm/s --------- LV E/e&', medial 8.12 --------- LV e&', average 10.74 cm/s --------- LV E/e&', average 7.54 ---------  Ventricular septum Value  Reference IVS thickness, ED 12.3 mm ---------  LVOT Value Reference LVOT ID, S 25 mm --------- LVOT area 4.91 cm^2 --------- LVOT ID 25 mm --------- LVOT peak velocity, S 67.6 cm/s --------- LVOT mean velocity, S 47.3 cm/s --------- LVOT VTI, S 16.7 cm --------- LVOT peak gradient, S 2 mm Hg --------- Stroke volume (SV), LVOT DP 82 ml --------- Stroke index (SV/bsa), LVOT DP 42.5 ml/m^2 ---------  Aortic valve Value Reference Aortic valve peak velocity, S 366 cm/s --------- Aortic valve mean velocity, S 247 cm/s --------- Aortic valve VTI, S 87.9 cm --------- Aortic mean gradient, S 38 mm Hg --------- Aortic peak gradient, S 54 mm Hg --------- VTI ratio, LVOT/AV 0.19 --------- Aortic valve area, VTI 0.93 cm^2 --------- Aortic valve area/bsa, VTI 0.48 cm^2/m^2 --------- Velocity ratio, peak, LVOT/AV 0.18 --------- Aortic valve area, peak velocity 0.91 cm^2 --------- Aortic valve area/bsa, peak 0.47 cm^2/m^2 --------- velocity Velocity ratio, mean, LVOT/AV 0.19 --------- Aortic valve area, mean velocity 0.94 cm^2 --------- Aortic valve area/bsa, mean 0.49 cm^2/m^2 --------- velocity Aortic regurg pressure half-time 674 ms ---------  Aorta  Value Reference Aortic root ID, ED 34 mm --------- Ascending aorta ID, A-P, S 36 mm ---------  Left atrium Value Reference LA ID, A-P, ES 50 mm --------- LA ID/bsa, A-P (H) 2.6 cm/m^2 <=2.2 LA volume, S 88 ml --------- LA volume/bsa, S 45.7 ml/m^2 --------- LA volume, ES, 1-p A4C 60 ml --------- LA volume/bsa, ES, 1-p A4C 31.1 ml/m^2 --------- LA volume, ES, 1-p A2C 109 ml --------- LA volume/bsa, ES, 1-p A2C 56.6 ml/m^2 ---------  Mitral valve Value Reference Mitral E-wave peak velocity 81 cm/s --------- Mitral A-wave peak velocity 62.1 cm/s --------- Mitral deceleration time (H) 254 ms 150 - 230 Mitral peak gradient, D 3 mm Hg --------- Mitral E/A ratio, peak 1.3 ---------  Tricuspid valve Value Reference Tricuspid regurg peak velocity 242 cm/s --------- Tricuspid peak RV-RA gradient 23 mm Hg ---------  Right ventricle Value Reference RV s&', lateral, S 10.7 cm/s ---------  Legend: (L) and (H) mark values outside specified reference range.  ------------------------------------------------------------------- Prepared and Electronically Authenticated by  Olga MillersBrian Crenshaw 2018-10-04T11:42:14    RIGHT HEART CATH AND CORONARY ANGIOGRAPHY  Conclusion     Ost RCA to Prox RCA lesion is 10% stenosed.  Mid  RCA lesion is 95% stenosed.  Ost Cx lesion is 90% stenosed.  Prox Cx lesion is 15% stenosed.  Prox Cx to Mid Cx lesion is 15% stenosed.  Previously placed Ost 1st Mrg to 1st Mrg stent (unknown type) is widely patent.  Ost LAD to Prox LAD lesion is 80% stenosed.  Prox LAD lesion is 15% stenosed.  Mid LAD lesion is 80% stenosed.  Mid Cx to Dist Cx lesion is 50% stenosed.  Severe multivessel CAD with diffuse 80% ostial LAD stenosis before the previously placed LAD stent and diffuse 80% mid LAD stenosis beyond the stented segment; 80% calcified ostial left circumflex stenosis with patent proximal circumflex stent beyond the ostium and patent bifurcation stents in the OM vessel and mid AV groove circumflex with 50% distal circumflex stenosis; patent proximal RCA stent, but with new 95% irregular eccentric plaque in the mid RCA.  Severely reduced aortic valve excursion suggestive of severe aortic stenosis with recent echo in October 2018 showing a mean gradient of 38 and peak gradient of 54 mmHg.  Mildly dilated aortic root.  RECOMMENDATION: Surgical consultation will be obtained for CABG revascularization surgery and aortic valve replacement. The patient apparently has a coagulation disorder with lupus and had been on Coumadin which was stopped for the catheterization. He will be heparinized 8 hours following sheath removal.   Indications   Coronary artery disease involving native coronary artery of native heart with unstable angina pectoris (HCC) [I25.110 (ICD-10-CM)]  Nonrheumatic aortic valve stenosis [I35.0 (ICD-10-CM)]  Procedural Details/Technique   Technical Details Mr. Sayyid Harewood is a 67 year old Caucasian male who has significant history for coronary artery disease and has undergone multiple stents to his LAD, proximal circumflex, mid circumflex, OM, and RCA. His last cardiac catheterization was in 2016 by Dr. Herbie Baltimore. There was diffuse disease and medical therapy was  recommended. He has been found to have aortic stenosis with an echo Doppler study in October 2018 showed a mean gradient of 38 and peak gradient of 54 with normal LV function. He recently developed progressive exertional chest pain with unstable anginal symptoms. He was seen by Theodore Demark and was referred for repeat cardiac catheterization.  With the patient's history of aortic stenosis, a right and left heart cardiac catheterization was performed. He had very poor pulses in the radial artery. The procedure was done through the right femoral approach. The right frontal arteries function anteriorly and a 6 French sheath was inserted without difficulty. A 7 French venous sheath was inserted in the right femoral vein. Swan-Ganz catheter was advanced into the venous sheath and pressures were recorded in the RA, RV, PA, and wedge positions. PA saturation was obtained. Cardiac output was obtained by the thermodilution and Fick method. Diagnostic catheterization in the coronary arteries was done with 5 [place] JL 5 and JR4 catheters. Several attempts were made to cross the aortic valve with the right catheter unsuccessfully. The catheter was then removed and a pigtail catheter was inserted for supravalvular aortography. With the demonstration of significantly reduced aortic valve excursion, as well as the severe multivessel CAD and need for CABG revascularization surgery with AVR and with the valve data obtained from recent echo Doppler study, no further attempt was made at crossing the aortic valve. All catheters were removed from the patient. The patient tolerated the procedure well.   Estimated blood loss <50 mL.  During this procedure the patient was  administered the following to achieve and maintain moderate conscious sedation: Versed 2 mg, Fentanyl 25 mcg, while the patient's heart rate, blood pressure, and oxygen saturation were continuously monitored. The period of conscious sedation was 60 minutes, of  which I was present face-to-face 100% of this time.  Coronary Findings   Diagnostic  Dominance: Right  Left Anterior Descending  Ost LAD to Prox LAD lesion 80% stenosed  Ost LAD to Prox LAD lesion is 80% stenosed. The lesion is eccentric.  Prox LAD lesion 15% stenosed  Prox LAD lesion is 15% stenosed. The lesion was previously treated.  Mid LAD lesion 80% stenosed  Mid LAD lesion is 80% stenosed.  Left Circumflex  Ost Cx lesion 90% stenosed  Ost Cx lesion is 90% stenosed. The lesion is calcified.  Prox Cx lesion 15% stenosed  Prox Cx lesion is 15% stenosed. The lesion was previously treated.  Prox Cx to Mid Cx lesion 15% stenosed  Prox Cx to Mid Cx lesion is 15% stenosed. The lesion was previously treated.  Mid Cx to Dist Cx lesion 50% stenosed  Mid Cx to Dist Cx lesion is 50% stenosed.  First Obtuse Marginal Branch  Ost 1st Mrg to 1st Mrg lesion 0% stenosed  Previously placed Ost 1st Mrg to 1st Mrg stent (unknown type) is widely patent.  Right Coronary Artery  Ost RCA to Prox RCA lesion 10% stenosed  Ost RCA to Prox RCA lesion is 10% stenosed. The lesion was previously treated.  Mid RCA lesion 95% stenosed  Mid RCA lesion is 95% stenosed. The lesion is eccentric and irregular.  Intervention   No interventions have been documented.  Right Heart   Right Heart Pressures RA: A wave 9, V wave 9, mean 8. RV: 36/9 PA: 31/13; mean 2 PW: A wave 13, V wave 16, mean 12.  Central aortic pressure initially 104/52.  Simultaneous AO 112/59, and PA 29/8  Oxygen saturation in the aorta. 98% and in the PA 73%.  Cardiac output by the thermodilution method 6.6, and by the Fick method 7.1 L/m.  Cardiac index by the thermodilution method 3.4 and by the Fick method 3.7 L/m/m.  Left Heart   Aorta Aortic Root: Supravalvular aortography demonstrated a mildly dilated aortic root. The aortic valve was significantly calcified and there was severely reduced aortic valve excursion.   Coronary Diagrams   Diagnostic Diagram       Implants        No implant documentation for this case.  MERGE Images   Show images for CARDIAC CATHETERIZATION   Link to Procedure Log   Procedure Log    Hemo Data    Most Recent Value  Fick Cardiac Output 7.12 L/min  Fick Cardiac Output Index 3.69 (L/min)/BSA  Thermal Cardiac Output 6.57 L/min  Thermal Cardiac Output Index 3.4 (L/min)/BSA  RA A Wave 9 mmHg  RA V Wave 9 mmHg  RA Mean 8 mmHg  RV Systolic Pressure 36 mmHg  RV Diastolic Pressure 2 mmHg  RV EDP 9 mmHg  PA Systolic Pressure 29 mmHg  PA Diastolic Pressure 8 mmHg  PA Mean 18 mmHg  PW A Wave 13 mmHg  PW V Wave 16 mmHg  PW Mean 12 mmHg  AO Systolic Pressure 112 mmHg  AO Diastolic Pressure 59 mmHg  AO Mean 80 mmHg  TPVR Index 5.88 HRUI  TSVR Index 21.15 HRUI  PVR SVR Ratio 0.13  TPVR/TSVR Ratio 0.28   Cardiac TAVR CT  TECHNIQUE: The patient was scanned on  a [employer]. A 120 kV retrospective scan was triggered in the descending thoracic aorta at 111 HU's. Gantry rotation speed was 250 msecs and collimation was .6 mm. No beta blockade or nitro were given. The 3D data set was reconstructed in 5% intervals of the R-R cycle. Systolic and diastolic phases were analyzed on a dedicated work station using MPR, MIP and VRT modes. The patient received 80 cc of contrast.  FINDINGS: Aortic Valve: Trileaflet aortic valve with moderately thickened and calcified aortic valve with moderately restricted valve opening. No calcifications extending into the LVOT.  Aorta: Normal caliber with mild diffuse calcifications and no dissection.  Sinotubular Junction: 33 x 30 mm  Ascending Thoracic Aorta: 38 x 37 mm  Aortic Arch: 29 x 27 mm  Descending Thoracic Aorta: 25 x 25 mm  Sinus of Valsalva Measurements:  Non-coronary: 32 mm  Right -coronary: 33 mm  Left -coronary: 35 mm  Coronary Artery Height above  Annulus:  Left Main: 17 mm  Right Coronary: 16 mm  Virtual Basal Annulus Measurements:  Maximum/Minimum Diameter: 30 x 25 mm  Perimeter: 88.5 mm  Area: 593 mm2  Coronary Arteries: Normal origin. Severe almost circumferential calcifications of all of the coronary arteries.  IMPRESSION: 1. Trileaflet aortic valve with moderately thickened and calcified aortic valve with moderately restricted valve opening. No calcifications extending into the LVOT.  2. Thoracic aorta has normal caliber with mild diffuse calcifications and no dissection.  3. Coronary Arteries: Normal origin. Severe almost circumferential calcifications of all of the coronary arteries.  4. There is no ASD/VSD.  5. There is no thrombus in the left atrial appendage.  6. Normal size of the pulmonary artery.   Electronically Signed By: Tobias Alexander On: 10/04/2017 12:00     Impression:  Patient has stage D severe symptomatic aortic stenosis and severe multivessel coronary artery disease. He presents with accelerating symptoms of exertional angina and shortness of breath, functional class III.   Last week he had one brief episode of chest discomfort at rest relieved by nitroglycerin, consistent with unstable angina.  I have personally reviewed the patient's most recent follow-up transthoracic echocardiogram and the diagnostic cardiac catheterization performed October 02, 2017. Echocardiogram reveals severe aortic stenosis with normal left ventricular systolic function. The aortic valve is trileaflet. There is severe thickening, calcification, and restricted leaflet mobility involving all 3 leaflets of the aortic valve. Peak velocity across the aortic valve ranged as high as 4.0 m/s corresponding to mean transvalvular gradient estimated 38 mmHg. The DVI was reported 0.19. Diagnostic cardiac catheterization revealed severe multivessel coronary artery disease as outlined  previously. There is also significant calcification in the ascending thoracic aorta. I agree the patient would best be treated with aortic valve replacement and coronary artery bypass grafting. Risks associated with conventional surgery will be somewhat elevated because of the patient's numerous comorbid medical problems, and potentially by disease involving the ascending thoracic aorta.   In addition, his postoperative convalescence may be affected by limited mobility due to increased pain in his right knee.     Plan:  The patient and his wife were againcounseled at length regarding treatment alternatives for management of severe aortic stenosis and coronary artery disease. The indications, risks and potential benefits of surgery were reviewed at length. Expectations regarding his postoperative convalescence were discussed.Discussion was held comparing the relative risks of mechanical valve replacement with need for lifelong anticoagulation versus use of a bioprosthetic tissue valve and the associated potential for late structural valve deterioration  and failure. This discussion was placed in the context of the patient's particular circumstances, and as a result the patient specifically requests that their valve be replaced using a bioprosthetic tissue valve. The potential advantages and disadvantages associated with use of a rapid-deployment bioprosthetic aortic valve were discussed, including the risks of paravalvular leak, need for permanent pacemaker placement, and expectations for long-term durability.  The patient understands and accepts all potential associated risks of surgery including but not limited to risk of death, stroke, myocardial infarction, congestive heart failure, respiratory failure, renal failure, pneumonia, bleeding requiring blood transfusion and or reexploration, arrhythmia, heart block or bradycardia requiring permanent pacemaker, aortic dissection or other major  vascular complication, pleural effusions or other delayed complications related to continued congestive heart failure,late recurrence of symptomatic ischemic heart disease,and other late complications related to valve replacement including structural valve deterioration and failure, thrombosis, endocarditis, or paravalvular leak.All questions answered.    Salvatore Decent. Cornelius Moras, MD 10/16/2017 3:57 PM

## 2017-10-17 ENCOUNTER — Encounter (HOSPITAL_COMMUNITY)
Admission: RE | Disposition: A | Discharge status: Home Health | Payer: Self-pay | Source: Ambulatory Visit | Attending: Thoracic Surgery (Cardiothoracic Vascular Surgery)

## 2017-10-17 ENCOUNTER — Encounter (HOSPITAL_COMMUNITY): Payer: Self-pay | Admitting: *Deleted

## 2017-10-17 ENCOUNTER — Inpatient Hospital Stay (HOSPITAL_COMMUNITY): Payer: PPO | Admitting: Anesthesiology

## 2017-10-17 ENCOUNTER — Ambulatory Visit (HOSPITAL_COMMUNITY): Payer: PPO

## 2017-10-17 ENCOUNTER — Inpatient Hospital Stay (HOSPITAL_COMMUNITY)
Admission: RE | Admit: 2017-10-17 | Discharge: 2017-10-23 | DRG: 219 | Disposition: A | Discharge status: Home Health | Payer: PPO | Source: Ambulatory Visit | Attending: Thoracic Surgery (Cardiothoracic Vascular Surgery) | Admitting: Thoracic Surgery (Cardiothoracic Vascular Surgery)

## 2017-10-17 ENCOUNTER — Inpatient Hospital Stay (HOSPITAL_COMMUNITY): Payer: PPO

## 2017-10-17 ENCOUNTER — Inpatient Hospital Stay (HOSPITAL_COMMUNITY): Payer: PPO | Admitting: Vascular Surgery

## 2017-10-17 DIAGNOSIS — I083 Combined rheumatic disorders of mitral, aortic and tricuspid valves: Secondary | ICD-10-CM | POA: Diagnosis present

## 2017-10-17 DIAGNOSIS — I493 Ventricular premature depolarization: Secondary | ICD-10-CM | POA: Diagnosis not present

## 2017-10-17 DIAGNOSIS — Z953 Presence of xenogenic heart valve: Secondary | ICD-10-CM

## 2017-10-17 DIAGNOSIS — M1711 Unilateral primary osteoarthritis, right knee: Secondary | ICD-10-CM | POA: Diagnosis present

## 2017-10-17 DIAGNOSIS — D6862 Lupus anticoagulant syndrome: Secondary | ICD-10-CM | POA: Diagnosis present

## 2017-10-17 DIAGNOSIS — I25110 Atherosclerotic heart disease of native coronary artery with unstable angina pectoris: Secondary | ICD-10-CM | POA: Diagnosis present

## 2017-10-17 DIAGNOSIS — I200 Unstable angina: Secondary | ICD-10-CM | POA: Diagnosis present

## 2017-10-17 DIAGNOSIS — I2510 Atherosclerotic heart disease of native coronary artery without angina pectoris: Secondary | ICD-10-CM

## 2017-10-17 DIAGNOSIS — E785 Hyperlipidemia, unspecified: Secondary | ICD-10-CM | POA: Diagnosis present

## 2017-10-17 DIAGNOSIS — I358 Other nonrheumatic aortic valve disorders: Secondary | ICD-10-CM | POA: Diagnosis not present

## 2017-10-17 DIAGNOSIS — E876 Hypokalemia: Secondary | ICD-10-CM | POA: Diagnosis not present

## 2017-10-17 DIAGNOSIS — R110 Nausea: Secondary | ICD-10-CM | POA: Diagnosis not present

## 2017-10-17 DIAGNOSIS — I4891 Unspecified atrial fibrillation: Secondary | ICD-10-CM | POA: Diagnosis not present

## 2017-10-17 DIAGNOSIS — I447 Left bundle-branch block, unspecified: Secondary | ICD-10-CM | POA: Diagnosis not present

## 2017-10-17 DIAGNOSIS — Z86718 Personal history of other venous thrombosis and embolism: Secondary | ICD-10-CM

## 2017-10-17 DIAGNOSIS — J9859 Other diseases of mediastinum, not elsewhere classified: Secondary | ICD-10-CM | POA: Diagnosis present

## 2017-10-17 DIAGNOSIS — Z9849 Cataract extraction status, unspecified eye: Secondary | ICD-10-CM

## 2017-10-17 DIAGNOSIS — D62 Acute posthemorrhagic anemia: Secondary | ICD-10-CM | POA: Diagnosis not present

## 2017-10-17 DIAGNOSIS — M3213 Lung involvement in systemic lupus erythematosus: Secondary | ICD-10-CM | POA: Diagnosis present

## 2017-10-17 DIAGNOSIS — Z8249 Family history of ischemic heart disease and other diseases of the circulatory system: Secondary | ICD-10-CM

## 2017-10-17 DIAGNOSIS — E8881 Metabolic syndrome: Secondary | ICD-10-CM | POA: Diagnosis present

## 2017-10-17 DIAGNOSIS — I440 Atrioventricular block, first degree: Secondary | ICD-10-CM | POA: Diagnosis not present

## 2017-10-17 DIAGNOSIS — Z882 Allergy status to sulfonamides status: Secondary | ICD-10-CM

## 2017-10-17 DIAGNOSIS — I739 Peripheral vascular disease, unspecified: Secondary | ICD-10-CM | POA: Diagnosis present

## 2017-10-17 DIAGNOSIS — H409 Unspecified glaucoma: Secondary | ICD-10-CM | POA: Diagnosis present

## 2017-10-17 DIAGNOSIS — M79604 Pain in right leg: Secondary | ICD-10-CM | POA: Diagnosis present

## 2017-10-17 DIAGNOSIS — J9811 Atelectasis: Secondary | ICD-10-CM | POA: Diagnosis not present

## 2017-10-17 DIAGNOSIS — K2270 Barrett's esophagus without dysplasia: Secondary | ICD-10-CM | POA: Diagnosis present

## 2017-10-17 DIAGNOSIS — M2550 Pain in unspecified joint: Secondary | ICD-10-CM | POA: Diagnosis present

## 2017-10-17 DIAGNOSIS — Z7982 Long term (current) use of aspirin: Secondary | ICD-10-CM

## 2017-10-17 DIAGNOSIS — I350 Nonrheumatic aortic (valve) stenosis: Secondary | ICD-10-CM

## 2017-10-17 DIAGNOSIS — Z8701 Personal history of pneumonia (recurrent): Secondary | ICD-10-CM

## 2017-10-17 DIAGNOSIS — Z7901 Long term (current) use of anticoagulants: Secondary | ICD-10-CM

## 2017-10-17 DIAGNOSIS — I10 Essential (primary) hypertension: Secondary | ICD-10-CM | POA: Diagnosis present

## 2017-10-17 DIAGNOSIS — K219 Gastro-esophageal reflux disease without esophagitis: Secondary | ICD-10-CM | POA: Diagnosis present

## 2017-10-17 DIAGNOSIS — Z9689 Presence of other specified functional implants: Secondary | ICD-10-CM

## 2017-10-17 DIAGNOSIS — Z9861 Coronary angioplasty status: Secondary | ICD-10-CM

## 2017-10-17 DIAGNOSIS — Z8261 Family history of arthritis: Secondary | ICD-10-CM

## 2017-10-17 DIAGNOSIS — Z974 Presence of external hearing-aid: Secondary | ICD-10-CM

## 2017-10-17 DIAGNOSIS — H9190 Unspecified hearing loss, unspecified ear: Secondary | ICD-10-CM | POA: Diagnosis present

## 2017-10-17 DIAGNOSIS — M1990 Unspecified osteoarthritis, unspecified site: Secondary | ICD-10-CM | POA: Diagnosis present

## 2017-10-17 DIAGNOSIS — R197 Diarrhea, unspecified: Secondary | ICD-10-CM | POA: Diagnosis not present

## 2017-10-17 DIAGNOSIS — Z951 Presence of aortocoronary bypass graft: Secondary | ICD-10-CM

## 2017-10-17 DIAGNOSIS — Z955 Presence of coronary angioplasty implant and graft: Secondary | ICD-10-CM

## 2017-10-17 HISTORY — DX: Presence of xenogenic heart valve: Z95.3

## 2017-10-17 HISTORY — PX: AORTIC VALVE REPLACEMENT: SHX41

## 2017-10-17 HISTORY — PX: CORONARY ARTERY BYPASS GRAFT: SHX141

## 2017-10-17 HISTORY — DX: Presence of aortocoronary bypass graft: Z95.1

## 2017-10-17 HISTORY — PX: TEE WITHOUT CARDIOVERSION: SHX5443

## 2017-10-17 LAB — POCT I-STAT 4, (NA,K, GLUC, HGB,HCT)
GLUCOSE: 107 mg/dL — AB (ref 65–99)
HCT: 27 % — ABNORMAL LOW (ref 39.0–52.0)
Hemoglobin: 9.2 g/dL — ABNORMAL LOW (ref 13.0–17.0)
POTASSIUM: 3.8 mmol/L (ref 3.5–5.1)
Sodium: 142 mmol/L (ref 135–145)

## 2017-10-17 LAB — PLATELET COUNT: Platelets: 156 10*3/uL (ref 150–400)

## 2017-10-17 LAB — POCT I-STAT 3, ART BLOOD GAS (G3+)
ACID-BASE EXCESS: 2 mmol/L (ref 0.0–2.0)
Acid-base deficit: 1 mmol/L (ref 0.0–2.0)
Acid-base deficit: 5 mmol/L — ABNORMAL HIGH (ref 0.0–2.0)
BICARBONATE: 20.4 mmol/L (ref 20.0–28.0)
Bicarbonate: 27.5 mmol/L (ref 20.0–28.0)
Bicarbonate: 24.5 mmol/L (ref 20.0–28.0)
O2 SAT: 100 %
O2 SAT: 99 %
O2 Saturation: 99 %
PCO2 ART: 47.1 mmHg (ref 32.0–48.0)
PCO2 ART: 40.6 mmHg (ref 32.0–48.0)
PO2 ART: 366 mmHg — AB (ref 83.0–108.0)
PO2 ART: 144 mmHg — AB (ref 83.0–108.0)
Patient temperature: 36.4
Patient temperature: 38.5
TCO2: 29 mmol/L (ref 22–32)
TCO2: 26 mmol/L (ref 22–32)
TCO2: 22 mmol/L (ref 22–32)
pCO2 arterial: 42.1 mmHg (ref 32.0–48.0)
pH, Arterial: 7.375 (ref 7.350–7.450)
pH, Arterial: 7.37 (ref 7.350–7.450)
pH, Arterial: 7.317 — ABNORMAL LOW (ref 7.350–7.450)
pO2, Arterial: 159 mmHg — ABNORMAL HIGH (ref 83.0–108.0)

## 2017-10-17 LAB — POCT I-STAT, CHEM 8
BUN: 14 mg/dL (ref 6–20)
BUN: 13 mg/dL (ref 6–20)
BUN: 14 mg/dL (ref 6–20)
BUN: 13 mg/dL (ref 6–20)
BUN: 13 mg/dL (ref 6–20)
BUN: 14 mg/dL (ref 6–20)
BUN: 16 mg/dL (ref 6–20)
CALCIUM ION: 1.07 mmol/L — AB (ref 1.15–1.40)
CALCIUM ION: 1.16 mmol/L (ref 1.15–1.40)
CALCIUM ION: 1.56 mmol/L — AB (ref 1.15–1.40)
CHLORIDE: 103 mmol/L (ref 101–111)
CHLORIDE: 101 mmol/L (ref 101–111)
CHLORIDE: 102 mmol/L (ref 101–111)
CHLORIDE: 107 mmol/L (ref 101–111)
Calcium, Ion: 1.18 mmol/L (ref 1.15–1.40)
Calcium, Ion: 1.07 mmol/L — ABNORMAL LOW (ref 1.15–1.40)
Calcium, Ion: 1.05 mmol/L — ABNORMAL LOW (ref 1.15–1.40)
Calcium, Ion: 1.22 mmol/L (ref 1.15–1.40)
Chloride: 102 mmol/L (ref 101–111)
Chloride: 99 mmol/L — ABNORMAL LOW (ref 101–111)
Chloride: 101 mmol/L (ref 101–111)
Creatinine, Ser: 0.5 mg/dL — ABNORMAL LOW (ref 0.61–1.24)
Creatinine, Ser: 0.4 mg/dL — ABNORMAL LOW (ref 0.61–1.24)
Creatinine, Ser: 0.5 mg/dL — ABNORMAL LOW (ref 0.61–1.24)
Creatinine, Ser: 0.4 mg/dL — ABNORMAL LOW (ref 0.61–1.24)
Creatinine, Ser: 0.4 mg/dL — ABNORMAL LOW (ref 0.61–1.24)
Creatinine, Ser: 0.5 mg/dL — ABNORMAL LOW (ref 0.61–1.24)
Creatinine, Ser: 0.6 mg/dL — ABNORMAL LOW (ref 0.61–1.24)
Glucose, Bld: 98 mg/dL (ref 65–99)
Glucose, Bld: 104 mg/dL — ABNORMAL HIGH (ref 65–99)
Glucose, Bld: 93 mg/dL (ref 65–99)
Glucose, Bld: 154 mg/dL — ABNORMAL HIGH (ref 65–99)
Glucose, Bld: 132 mg/dL — ABNORMAL HIGH (ref 65–99)
Glucose, Bld: 171 mg/dL — ABNORMAL HIGH (ref 65–99)
Glucose, Bld: 124 mg/dL — ABNORMAL HIGH (ref 65–99)
HEMATOCRIT: 30 % — AB (ref 39.0–52.0)
HEMATOCRIT: 27 % — AB (ref 39.0–52.0)
HEMATOCRIT: 29 % — AB (ref 39.0–52.0)
HEMATOCRIT: 27 % — AB (ref 39.0–52.0)
HEMATOCRIT: 27 % — AB (ref 39.0–52.0)
HEMATOCRIT: 25 % — AB (ref 39.0–52.0)
HEMATOCRIT: 24 % — AB (ref 39.0–52.0)
HEMOGLOBIN: 10.2 g/dL — AB (ref 13.0–17.0)
HEMOGLOBIN: 9.9 g/dL — AB (ref 13.0–17.0)
HEMOGLOBIN: 9.2 g/dL — AB (ref 13.0–17.0)
HEMOGLOBIN: 9.2 g/dL — AB (ref 13.0–17.0)
Hemoglobin: 9.2 g/dL — ABNORMAL LOW (ref 13.0–17.0)
Hemoglobin: 8.5 g/dL — ABNORMAL LOW (ref 13.0–17.0)
Hemoglobin: 8.2 g/dL — ABNORMAL LOW (ref 13.0–17.0)
POTASSIUM: 4 mmol/L (ref 3.5–5.1)
POTASSIUM: 4.4 mmol/L (ref 3.5–5.1)
POTASSIUM: 4.3 mmol/L (ref 3.5–5.1)
POTASSIUM: 4.1 mmol/L (ref 3.5–5.1)
POTASSIUM: 5.3 mmol/L — AB (ref 3.5–5.1)
Potassium: 4.4 mmol/L (ref 3.5–5.1)
Potassium: 4.5 mmol/L (ref 3.5–5.1)
SODIUM: 143 mmol/L (ref 135–145)
SODIUM: 141 mmol/L (ref 135–145)
SODIUM: 142 mmol/L (ref 135–145)
SODIUM: 139 mmol/L (ref 135–145)
SODIUM: 140 mmol/L (ref 135–145)
SODIUM: 139 mmol/L (ref 135–145)
Sodium: 141 mmol/L (ref 135–145)
TCO2: 26 mmol/L (ref 22–32)
TCO2: 27 mmol/L (ref 22–32)
TCO2: 30 mmol/L (ref 22–32)
TCO2: 27 mmol/L (ref 22–32)
TCO2: 27 mmol/L (ref 22–32)
TCO2: 26 mmol/L (ref 22–32)
TCO2: 22 mmol/L (ref 22–32)

## 2017-10-17 LAB — CBC
HCT: 29.4 % — ABNORMAL LOW (ref 39.0–52.0)
HEMATOCRIT: 25.2 % — AB (ref 39.0–52.0)
Hemoglobin: 9.4 g/dL — ABNORMAL LOW (ref 13.0–17.0)
Hemoglobin: 7.8 g/dL — ABNORMAL LOW (ref 13.0–17.0)
MCH: 26.4 pg (ref 26.0–34.0)
MCH: 25.5 pg — ABNORMAL LOW (ref 26.0–34.0)
MCHC: 32 g/dL (ref 30.0–36.0)
MCHC: 31 g/dL (ref 30.0–36.0)
MCV: 82.6 fL (ref 78.0–100.0)
MCV: 82.4 fL (ref 78.0–100.0)
PLATELETS: 134 10*3/uL — AB (ref 150–400)
PLATELETS: 166 10*3/uL (ref 150–400)
RBC: 3.56 MIL/uL — AB (ref 4.22–5.81)
RBC: 3.06 MIL/uL — ABNORMAL LOW (ref 4.22–5.81)
RDW: 15.6 % — ABNORMAL HIGH (ref 11.5–15.5)
RDW: 15.5 % (ref 11.5–15.5)
WBC: 6.2 10*3/uL (ref 4.0–10.5)
WBC: 6.4 10*3/uL (ref 4.0–10.5)

## 2017-10-17 LAB — MAGNESIUM: Magnesium: 2.8 mg/dL — ABNORMAL HIGH (ref 1.7–2.4)

## 2017-10-17 LAB — GLUCOSE, CAPILLARY
GLUCOSE-CAPILLARY: 98 mg/dL (ref 65–99)
Glucose-Capillary: 124 mg/dL — ABNORMAL HIGH (ref 65–99)
Glucose-Capillary: 82 mg/dL (ref 65–99)

## 2017-10-17 LAB — PROTIME-INR
INR: 1.47
PROTHROMBIN TIME: 17.7 s — AB (ref 11.4–15.2)

## 2017-10-17 LAB — HEMOGLOBIN AND HEMATOCRIT, BLOOD
HEMATOCRIT: 25.3 % — AB (ref 39.0–52.0)
Hemoglobin: 8 g/dL — ABNORMAL LOW (ref 13.0–17.0)

## 2017-10-17 LAB — CREATININE, SERUM
Creatinine, Ser: 0.74 mg/dL (ref 0.61–1.24)
GFR calc Af Amer: >60 mL/min (ref >60)
GFR calc non Af Amer: >60 mL/min (ref >60)

## 2017-10-17 LAB — APTT: APTT: 40 s — AB (ref 24–36)

## 2017-10-17 SURGERY — REPLACEMENT, AORTIC VALVE, OPEN
Anesthesia: General | Site: Chest

## 2017-10-17 MED ORDER — NITROGLYCERIN IN D5W 200-5 MCG/ML-% IV SOLN: 0.0000 ug/min | INTRAVENOUS | Status: DC

## 2017-10-17 MED ORDER — ACETAMINOPHEN 160 MG/5ML PO SOLN: 1000.0000 mg | Freq: Four times a day (QID) | ORAL | Status: DC

## 2017-10-17 MED ORDER — CHLORHEXIDINE GLUCONATE 0.12 % MT SOLN
15.0000 mL | OROMUCOSAL | Status: AC
  Administered 2017-10-17: 15 mL via OROMUCOSAL

## 2017-10-17 MED ORDER — LACTATED RINGERS IV SOLN
INTRAVENOUS | Status: DC
  Administered 2017-10-17: 16:00:00 via INTRAVENOUS

## 2017-10-17 MED ORDER — MORPHINE SULFATE (PF) 4 MG/ML IV SOLN
1.0000 mg | INTRAVENOUS | Status: DC | PRN
  Administered 2017-10-18 – 2017-10-20 (×4): 2 mg via INTRAVENOUS
  Filled 2017-10-17 (×4): qty 1

## 2017-10-17 MED ORDER — PROPOFOL 10 MG/ML IV BOLUS
INTRAVENOUS | Status: AC
  Filled 2017-10-17: qty 20

## 2017-10-17 MED ORDER — SODIUM CHLORIDE 0.45 % IV SOLN
INTRAVENOUS | Status: DC | PRN
  Administered 2017-10-17: 16:00:00 via INTRAVENOUS

## 2017-10-17 MED ORDER — MAGNESIUM SULFATE 4 GM/100ML IV SOLN
4.0000 g | Freq: Once | INTRAVENOUS | Status: AC
  Administered 2017-10-17: 4 g via INTRAVENOUS
  Filled 2017-10-17: qty 100

## 2017-10-17 MED ORDER — SODIUM CHLORIDE 0.9% FLUSH
10.0000 mL | Freq: Two times a day (BID) | INTRAVENOUS | Status: DC
  Administered 2017-10-18 – 2017-10-19 (×2): 10 mL
  Administered 2017-10-20: 30 mL
  Administered 2017-10-21: 10 mL

## 2017-10-17 MED ORDER — ACETAMINOPHEN 500 MG PO TABS
1000.0000 mg | ORAL_TABLET | Freq: Four times a day (QID) | ORAL | Status: DC
  Administered 2017-10-18 – 2017-10-20 (×5): 1000 mg via ORAL
  Filled 2017-10-17 (×5): qty 2

## 2017-10-17 MED ORDER — DEXTROSE 5 % IV SOLN
INTRAVENOUS | Status: DC | PRN
  Administered 2017-10-17: 750 mg via INTRAVENOUS

## 2017-10-17 MED ORDER — MUPIROCIN 2 % EX OINT: 1.0000 "application " | TOPICAL_OINTMENT | Freq: Two times a day (BID) | CUTANEOUS | Status: DC

## 2017-10-17 MED ORDER — MORPHINE SULFATE (PF) 2 MG/ML IV SOLN
1.0000 mg | INTRAVENOUS | Status: DC | PRN
Start: 2017-10-17 — End: 2017-10-17

## 2017-10-17 MED ORDER — PROPOFOL 10 MG/ML IV BOLUS
INTRAVENOUS | Status: DC | PRN
  Administered 2017-10-17: 40 mg via INTRAVENOUS

## 2017-10-17 MED ORDER — SODIUM CHLORIDE 0.9 % IV SOLN: 250.0000 mL | INTRAVENOUS | Status: DC

## 2017-10-17 MED ORDER — ACETAMINOPHEN 160 MG/5ML PO SOLN: 650.0000 mg | Freq: Once | ORAL | Status: AC

## 2017-10-17 MED ORDER — CALCIUM CHLORIDE 10 % IV SOLN
INTRAVENOUS | Status: DC | PRN
  Administered 2017-10-17 (×3): 200 mg via INTRAVENOUS

## 2017-10-17 MED ORDER — FAMOTIDINE IN NACL 20-0.9 MG/50ML-% IV SOLN
20.0000 mg | Freq: Two times a day (BID) | INTRAVENOUS | Status: AC
  Administered 2017-10-17 (×2): 20 mg via INTRAVENOUS
  Filled 2017-10-17: qty 50

## 2017-10-17 MED ORDER — ACETAMINOPHEN 650 MG RE SUPP
650.0000 mg | Freq: Once | RECTAL | Status: AC
  Administered 2017-10-17: 650 mg via RECTAL

## 2017-10-17 MED ORDER — PHENYLEPHRINE HCL 10 MG/ML IJ SOLN: INTRAVENOUS | Status: DC | PRN

## 2017-10-17 MED ORDER — PROTAMINE SULFATE 10 MG/ML IV SOLN
INTRAVENOUS | Status: DC | PRN
  Administered 2017-10-17: 75 mg via INTRAVENOUS
  Administered 2017-10-17: 10 mg via INTRAVENOUS
  Administered 2017-10-17: 75 mg via INTRAVENOUS
  Administered 2017-10-17: 25 mg via INTRAVENOUS
  Administered 2017-10-17: 75 mg via INTRAVENOUS

## 2017-10-17 MED ORDER — PANTOPRAZOLE SODIUM 40 MG PO TBEC
40.0000 mg | DELAYED_RELEASE_TABLET | Freq: Every day | ORAL | Status: DC
  Administered 2017-10-18 – 2017-10-23 (×6): 40 mg via ORAL
  Filled 2017-10-17 (×6): qty 1

## 2017-10-17 MED ORDER — SODIUM CHLORIDE 0.9% FLUSH: 3.0000 mL | INTRAVENOUS | Status: DC | PRN

## 2017-10-17 MED ORDER — METOPROLOL TARTRATE 25 MG/10 ML ORAL SUSPENSION: 12.5000 mg | Freq: Two times a day (BID) | ORAL | Status: DC

## 2017-10-17 MED ORDER — CALCIUM CHLORIDE 10 % IV SOLN
INTRAVENOUS | Status: AC
  Filled 2017-10-17: qty 20

## 2017-10-17 MED ORDER — SODIUM CHLORIDE 0.9% FLUSH: 10.0000 mL | INTRAVENOUS | Status: DC | PRN

## 2017-10-17 MED ORDER — LIDOCAINE 2% (20 MG/ML) 5 ML SYRINGE
INTRAMUSCULAR | Status: AC
  Filled 2017-10-17: qty 5

## 2017-10-17 MED ORDER — DEXMEDETOMIDINE HCL IN NACL 200 MCG/50ML IV SOLN
INTRAVENOUS | Status: AC
  Filled 2017-10-17: qty 50

## 2017-10-17 MED ORDER — CALCIUM CHLORIDE 10 % IV SOLN
1.0000 g | Freq: Once | INTRAVENOUS | Status: AC
  Administered 2017-10-17: 1 g via INTRAVENOUS

## 2017-10-17 MED ORDER — MIDAZOLAM HCL 10 MG/2ML IJ SOLN
INTRAMUSCULAR | Status: AC
  Filled 2017-10-17: qty 2

## 2017-10-17 MED ORDER — LACTATED RINGERS IV SOLN: INTRAVENOUS | Status: DC

## 2017-10-17 MED ORDER — MUPIROCIN 2 % EX OINT
TOPICAL_OINTMENT | CUTANEOUS | Status: AC
  Administered 2017-10-17: 1
  Filled 2017-10-17: qty 22

## 2017-10-17 MED ORDER — MORPHINE SULFATE (PF) 4 MG/ML IV SOLN: 1.0000 mg | INTRAVENOUS | Status: DC | PRN

## 2017-10-17 MED ORDER — CHLORHEXIDINE GLUCONATE 0.12% ORAL RINSE (MEDLINE KIT)
15.0000 mL | Freq: Two times a day (BID) | OROMUCOSAL | Status: DC
  Administered 2017-10-17 – 2017-10-20 (×5): 15 mL via OROMUCOSAL

## 2017-10-17 MED ORDER — INSULIN REGULAR BOLUS VIA INFUSION
0.0000 [IU] | Freq: Three times a day (TID) | INTRAVENOUS | Status: DC
  Filled 2017-10-17: qty 10

## 2017-10-17 MED ORDER — PHENYLEPHRINE HCL 10 MG/ML IJ SOLN
INTRAVENOUS | Status: DC | PRN
  Administered 2017-10-17: 25 ug/min via INTRAVENOUS

## 2017-10-17 MED ORDER — FENTANYL CITRATE (PF) 100 MCG/2ML IJ SOLN
INTRAMUSCULAR | Status: DC | PRN
  Administered 2017-10-17 (×4): 250 ug via INTRAVENOUS
  Administered 2017-10-17: 100 ug via INTRAVENOUS
  Administered 2017-10-17: 150 ug via INTRAVENOUS

## 2017-10-17 MED ORDER — BRIMONIDINE TARTRATE 0.15 % OP SOLN
1.0000 [drp] | Freq: Two times a day (BID) | OPHTHALMIC | Status: DC
  Administered 2017-10-17 – 2017-10-23 (×12): 1 [drp] via OPHTHALMIC
  Filled 2017-10-17 (×2): qty 5

## 2017-10-17 MED ORDER — CHLORHEXIDINE GLUCONATE CLOTH 2 % EX PADS
6.0000 | MEDICATED_PAD | Freq: Every day | CUTANEOUS | Status: DC
  Administered 2017-10-17 – 2017-10-21 (×5): 6 via TOPICAL

## 2017-10-17 MED ORDER — OXYCODONE HCL 5 MG PO TABS
5.0000 mg | ORAL_TABLET | ORAL | Status: DC | PRN
  Administered 2017-10-20 (×2): 10 mg via ORAL
  Filled 2017-10-17 (×2): qty 2

## 2017-10-17 MED ORDER — METOPROLOL TARTRATE 12.5 MG HALF TABLET: 12.5000 mg | ORAL_TABLET | Freq: Two times a day (BID) | ORAL | Status: DC

## 2017-10-17 MED ORDER — METOPROLOL TARTRATE 5 MG/5ML IV SOLN: 2.5000 mg | INTRAVENOUS | Status: DC | PRN

## 2017-10-17 MED ORDER — SODIUM CHLORIDE 0.9 % IJ SOLN
INTRAMUSCULAR | Status: AC
  Filled 2017-10-17: qty 30

## 2017-10-17 MED ORDER — ASPIRIN 81 MG PO CHEW: 324.0000 mg | CHEWABLE_TABLET | Freq: Every day | ORAL | Status: DC

## 2017-10-17 MED ORDER — SODIUM CHLORIDE 0.9 % IV SOLN: 1.0000 g | Freq: Once | INTRAVENOUS | Status: DC

## 2017-10-17 MED ORDER — MIDAZOLAM HCL 2 MG/2ML IJ SOLN: 2.0000 mg | INTRAMUSCULAR | Status: DC | PRN

## 2017-10-17 MED ORDER — PHENYLEPHRINE 40 MCG/ML (10ML) SYRINGE FOR IV PUSH (FOR BLOOD PRESSURE SUPPORT)
PREFILLED_SYRINGE | INTRAVENOUS | Status: AC
  Filled 2017-10-17: qty 10

## 2017-10-17 MED ORDER — SODIUM CHLORIDE 0.9 % IV SOLN
0.0000 ug/min | INTRAVENOUS | Status: DC
  Administered 2017-10-17: 30 ug/min via INTRAVENOUS
  Filled 2017-10-17: qty 2

## 2017-10-17 MED ORDER — VANCOMYCIN HCL IN DEXTROSE 1-5 GM/200ML-% IV SOLN
1000.0000 mg | Freq: Once | INTRAVENOUS | Status: AC
  Administered 2017-10-17: 1000 mg via INTRAVENOUS
  Filled 2017-10-17: qty 200

## 2017-10-17 MED ORDER — POTASSIUM CHLORIDE 10 MEQ/50ML IV SOLN
10.0000 meq | INTRAVENOUS | Status: AC
  Administered 2017-10-17 (×3): 10 meq via INTRAVENOUS

## 2017-10-17 MED ORDER — ORAL CARE MOUTH RINSE
15.0000 mL | Freq: Four times a day (QID) | OROMUCOSAL | Status: DC
  Administered 2017-10-17 – 2017-10-18 (×2): 15 mL via OROMUCOSAL

## 2017-10-17 MED ORDER — SODIUM CHLORIDE 0.9 % IR SOLN
Status: DC | PRN
  Administered 2017-10-17: 3000 mL

## 2017-10-17 MED ORDER — LACTATED RINGERS IV SOLN: 500.0000 mL | Freq: Once | INTRAVENOUS | Status: DC | PRN

## 2017-10-17 MED ORDER — HEPARIN SODIUM (PORCINE) 1000 UNIT/ML IJ SOLN
INTRAMUSCULAR | Status: DC | PRN
  Administered 2017-10-17: 28000 [IU] via INTRAVENOUS

## 2017-10-17 MED ORDER — MORPHINE SULFATE (PF) 2 MG/ML IV SOLN: 1.0000 mg | INTRAVENOUS | Status: DC | PRN

## 2017-10-17 MED ORDER — CHLORHEXIDINE GLUCONATE 4 % EX LIQD: 30.0000 mL | CUTANEOUS | Status: DC

## 2017-10-17 MED ORDER — SODIUM CHLORIDE 0.9 % IV SOLN
0.0000 ug/kg/h | INTRAVENOUS | Status: DC
  Filled 2017-10-17: qty 2

## 2017-10-17 MED ORDER — ALBUMIN HUMAN 5 % IV SOLN
250.0000 mL | INTRAVENOUS | Status: AC | PRN
  Administered 2017-10-17 (×4): 250 mL via INTRAVENOUS
  Filled 2017-10-17 (×2): qty 250

## 2017-10-17 MED ORDER — SODIUM CHLORIDE 0.9 % IJ SOLN
OROMUCOSAL | Status: DC | PRN
  Administered 2017-10-17 (×3): 4 mL via TOPICAL

## 2017-10-17 MED ORDER — BISACODYL 10 MG RE SUPP: 10.0000 mg | Freq: Every day | RECTAL | Status: DC

## 2017-10-17 MED ORDER — SODIUM CHLORIDE 0.9 % IV SOLN
INTRAVENOUS | Status: DC
  Administered 2017-10-17: 17:00:00 via INTRAVENOUS

## 2017-10-17 MED ORDER — PHENYLEPHRINE HCL 10 MG/ML IJ SOLN
INTRAMUSCULAR | Status: DC | PRN
  Administered 2017-10-17: 40 ug via INTRAVENOUS

## 2017-10-17 MED ORDER — ONDANSETRON HCL 4 MG/2ML IJ SOLN
4.0000 mg | Freq: Four times a day (QID) | INTRAMUSCULAR | Status: DC | PRN
  Administered 2017-10-18 – 2017-10-20 (×6): 4 mg via INTRAVENOUS
  Filled 2017-10-17 (×6): qty 2

## 2017-10-17 MED ORDER — BISACODYL 5 MG PO TBEC
10.0000 mg | DELAYED_RELEASE_TABLET | Freq: Every day | ORAL | Status: DC
  Administered 2017-10-18 – 2017-10-20 (×3): 10 mg via ORAL
  Filled 2017-10-17 (×4): qty 2

## 2017-10-17 MED ORDER — ALBUMIN HUMAN 5 % IV SOLN
INTRAVENOUS | Status: DC | PRN
  Administered 2017-10-17: 15:00:00 via INTRAVENOUS

## 2017-10-17 MED ORDER — FENTANYL CITRATE (PF) 250 MCG/5ML IJ SOLN
INTRAMUSCULAR | Status: AC
  Filled 2017-10-17: qty 20

## 2017-10-17 MED ORDER — CEFUROXIME SODIUM 1.5 G IV SOLR
1.5000 g | Freq: Two times a day (BID) | INTRAVENOUS | Status: AC
  Administered 2017-10-17 – 2017-10-19 (×4): 1.5 g via INTRAVENOUS
  Filled 2017-10-17 (×4): qty 1.5

## 2017-10-17 MED ORDER — MIDAZOLAM HCL 5 MG/5ML IJ SOLN
INTRAMUSCULAR | Status: DC | PRN
  Administered 2017-10-17: 1 mg via INTRAVENOUS
  Administered 2017-10-17: 5 mg via INTRAVENOUS
  Administered 2017-10-17: 3 mg via INTRAVENOUS
  Administered 2017-10-17: 1 mg via INTRAVENOUS

## 2017-10-17 MED ORDER — ASPIRIN EC 325 MG PO TBEC
325.0000 mg | DELAYED_RELEASE_TABLET | Freq: Every day | ORAL | Status: DC
  Filled 2017-10-17: qty 1

## 2017-10-17 MED ORDER — SODIUM CHLORIDE 0.9 % IV SOLN
INTRAVENOUS | Status: DC
  Filled 2017-10-17: qty 1

## 2017-10-17 MED ORDER — DOCUSATE SODIUM 100 MG PO CAPS
200.0000 mg | ORAL_CAPSULE | Freq: Every day | ORAL | Status: DC
  Administered 2017-10-18 – 2017-10-20 (×3): 200 mg via ORAL
  Filled 2017-10-17 (×4): qty 2

## 2017-10-17 MED ORDER — FENTANYL CITRATE (PF) 250 MCG/5ML IJ SOLN
INTRAMUSCULAR | Status: AC
Start: 2017-10-17 — End: 2017-10-17
  Filled 2017-10-17: qty 5

## 2017-10-17 MED ORDER — ROCURONIUM BROMIDE 100 MG/10ML IV SOLN
INTRAVENOUS | Status: DC | PRN
  Administered 2017-10-17: 100 mg via INTRAVENOUS
  Administered 2017-10-17 (×2): 50 mg via INTRAVENOUS

## 2017-10-17 MED ORDER — SODIUM CHLORIDE 0.9% FLUSH
3.0000 mL | Freq: Two times a day (BID) | INTRAVENOUS | Status: DC
  Administered 2017-10-18 – 2017-10-23 (×9): 3 mL via INTRAVENOUS

## 2017-10-17 MED ORDER — INSULIN ASPART 100 UNIT/ML ~~LOC~~ SOLN
0.0000 [IU] | SUBCUTANEOUS | Status: DC
  Administered 2017-10-18 – 2017-10-20 (×6): 2 [IU] via SUBCUTANEOUS

## 2017-10-17 MED ORDER — ROCURONIUM BROMIDE 10 MG/ML (PF) SYRINGE
PREFILLED_SYRINGE | INTRAVENOUS | Status: AC
  Filled 2017-10-17: qty 5

## 2017-10-17 MED ORDER — MORPHINE SULFATE (PF) 4 MG/ML IV SOLN
1.0000 mg | INTRAVENOUS | Status: DC | PRN
  Administered 2017-10-18: 4 mg via INTRAVENOUS
  Filled 2017-10-17: qty 1

## 2017-10-17 MED ORDER — SODIUM CHLORIDE 0.9 % IV SOLN
INTRAVENOUS | Status: DC
  Administered 2017-10-17: 18:00:00 via INTRAVENOUS

## 2017-10-17 MED ORDER — TRAMADOL HCL 50 MG PO TABS
50.0000 mg | ORAL_TABLET | ORAL | Status: DC | PRN
  Administered 2017-10-18 – 2017-10-20 (×2): 100 mg via ORAL
  Filled 2017-10-17 (×2): qty 2

## 2017-10-17 MED ORDER — MUPIROCIN 2 % EX OINT
1.0000 "application " | TOPICAL_OINTMENT | Freq: Two times a day (BID) | CUTANEOUS | Status: AC
  Administered 2017-10-17 – 2017-10-22 (×10): 1 via NASAL

## 2017-10-17 MED ORDER — LACTATED RINGERS IV SOLN
INTRAVENOUS | Status: DC | PRN
  Administered 2017-10-17 (×5): via INTRAVENOUS

## 2017-10-17 MED ORDER — TIMOLOL MALEATE 0.5 % OP SOLN
1.0000 [drp] | Freq: Two times a day (BID) | OPHTHALMIC | Status: DC
  Administered 2017-10-17 – 2017-10-23 (×12): 1 [drp] via OPHTHALMIC
  Filled 2017-10-17 (×2): qty 5

## 2017-10-17 MED ORDER — 0.9 % SODIUM CHLORIDE (POUR BTL) OPTIME
TOPICAL | Status: DC | PRN
  Administered 2017-10-17 (×2): 1000 mL
  Administered 2017-10-17: 6000 mL

## 2017-10-17 SURGICAL SUPPLY — 137 items
ADAPTER CARDIO PERF ANTE/RETRO (ADAPTER) ×3 IMPLANT
APPLICATOR COTTON TIP 6IN STRL (MISCELLANEOUS) ×3 IMPLANT
APPLIER CLIP 9.375 SM OPEN (CLIP) ×9
BAG DECANTER FOR FLEXI CONT (MISCELLANEOUS) ×6 IMPLANT
BANDAGE ACE 4X5 VEL STRL LF (GAUZE/BANDAGES/DRESSINGS) ×6 IMPLANT
BANDAGE ACE 6X5 VEL STRL LF (GAUZE/BANDAGES/DRESSINGS) ×6 IMPLANT
BASKET HEART (ORDER IN 25'S) (MISCELLANEOUS) ×1
BASKET HEART (ORDER IN 25S) (MISCELLANEOUS) ×2 IMPLANT
BLADE 11 SAFETY STRL DISP (BLADE) ×3 IMPLANT
BLADE CLIPPER SURG (BLADE) IMPLANT
BLADE MINI RND TIP GREEN BEAV (BLADE) ×3 IMPLANT
BLADE NEEDLE 3 SS STRL (BLADE) ×3 IMPLANT
BLADE STERNUM SYSTEM 6 (BLADE) ×3 IMPLANT
BLADE SURG 11 STRL SS (BLADE) ×3 IMPLANT
BNDG GAUZE ELAST 4 BULKY (GAUZE/BANDAGES/DRESSINGS) ×6 IMPLANT
CANISTER SUCT 3000ML PPV (MISCELLANEOUS) ×3 IMPLANT
CANNULA EZ GLIDE AORTIC 21FR (CANNULA) ×6 IMPLANT
CANNULA GUNDRY RCSP 15FR (MISCELLANEOUS) ×3 IMPLANT
CANNULA SOFTFLOW AORTIC 7M21FR (CANNULA) ×3 IMPLANT
CATH CPB KIT OWEN (MISCELLANEOUS) ×3 IMPLANT
CATH HEART VENT LEFT (CATHETERS) ×2 IMPLANT
CATH THORACIC 36FR (CATHETERS) ×3 IMPLANT
CATH THORACIC 36FR RT ANG (CATHETERS) IMPLANT
CLIP APPLIE 9.375 SM OPEN (CLIP) ×6 IMPLANT
CLIP FOGARTY SPRING 6M (CLIP) ×3 IMPLANT
CLIP RETRACTION 3.0MM CORONARY (MISCELLANEOUS) ×3 IMPLANT
CLIP VESOCCLUDE MED 24/CT (CLIP) IMPLANT
CLIP VESOCCLUDE SM WIDE 24/CT (CLIP) IMPLANT
CONT SPEC 4OZ CLIKSEAL STRL BL (MISCELLANEOUS) ×6 IMPLANT
COVER SURGICAL LIGHT HANDLE (MISCELLANEOUS) IMPLANT
CRADLE DONUT ADULT HEAD (MISCELLANEOUS) ×3 IMPLANT
DRAIN CHANNEL 32F RND 10.7 FF (WOUND CARE) ×6 IMPLANT
DRAPE BILATERAL SPLIT (DRAPES) IMPLANT
DRAPE CARDIOVASCULAR INCISE (DRAPES) ×1
DRAPE CV SPLIT W-CLR ANES SCRN (DRAPES) IMPLANT
DRAPE INCISE IOBAN 66X45 STRL (DRAPES) ×3 IMPLANT
DRAPE SLUSH/WARMER DISC (DRAPES) ×3 IMPLANT
DRAPE SRG 135X102X78XABS (DRAPES) ×2 IMPLANT
DRSG COVADERM 4X14 (GAUZE/BANDAGES/DRESSINGS) ×3 IMPLANT
DRSG COVADERM 4X8 (GAUZE/BANDAGES/DRESSINGS) ×3 IMPLANT
ELECT BLADE 4.0 EZ CLEAN MEGAD (MISCELLANEOUS) ×3
ELECT REM PT RETURN 9FT ADLT (ELECTROSURGICAL) ×6
ELECTRODE BLDE 4.0 EZ CLN MEGD (MISCELLANEOUS) ×2 IMPLANT
ELECTRODE REM PT RTRN 9FT ADLT (ELECTROSURGICAL) ×4 IMPLANT
FELT TEFLON 1X6 (MISCELLANEOUS) ×6 IMPLANT
GAUZE SPONGE 4X4 12PLY STRL (GAUZE/BANDAGES/DRESSINGS) ×6 IMPLANT
GAUZE SPONGE 4X4 12PLY STRL LF (GAUZE/BANDAGES/DRESSINGS) ×3 IMPLANT
GLOVE BIO SURGEON STRL SZ 6 (GLOVE) ×3 IMPLANT
GLOVE BIO SURGEON STRL SZ 6.5 (GLOVE) ×18 IMPLANT
GLOVE BIO SURGEON STRL SZ7 (GLOVE) ×9 IMPLANT
GLOVE BIO SURGEON STRL SZ7.5 (GLOVE) ×9 IMPLANT
GLOVE BIOGEL PI IND STRL 6 (GLOVE) ×4 IMPLANT
GLOVE BIOGEL PI IND STRL 6.5 (GLOVE) ×6 IMPLANT
GLOVE BIOGEL PI INDICATOR 6 (GLOVE) ×2
GLOVE BIOGEL PI INDICATOR 6.5 (GLOVE) ×3
GLOVE ORTHO TXT STRL SZ7.5 (GLOVE) ×15 IMPLANT
GLOVE SURG SS PI 6.0 STRL IVOR (GLOVE) ×9 IMPLANT
GOWN STRL REUS W/ TWL LRG LVL3 (GOWN DISPOSABLE) ×24 IMPLANT
GOWN STRL REUS W/TWL LRG LVL3 (GOWN DISPOSABLE) ×12
HEMOSTAT POWDER SURGIFOAM 1G (HEMOSTASIS) ×9 IMPLANT
INSERT FOGARTY XLG (MISCELLANEOUS) ×3 IMPLANT
KIT BASIN OR (CUSTOM PROCEDURE TRAY) ×3 IMPLANT
KIT DEVICE SUT COR-KNOT MIS 5 (INSTRUMENTS) ×3 IMPLANT
KIT DRAINAGE VACCUM ASSIST (KITS) ×3 IMPLANT
KIT ROOM TURNOVER OR (KITS) ×3 IMPLANT
KIT SUCTION CATH 14FR (SUCTIONS) ×12 IMPLANT
KIT VASOVIEW HEMOPRO VH 3000 (KITS) ×3 IMPLANT
LEAD PACING MYOCARDI (MISCELLANEOUS) ×3 IMPLANT
LINE VENT (MISCELLANEOUS) ×3 IMPLANT
MARKER GRAFT CORONARY BYPASS (MISCELLANEOUS) ×9 IMPLANT
NS IRRIG 1000ML POUR BTL (IV SOLUTION) ×15 IMPLANT
PACK E OPEN HEART (SUTURE) ×3 IMPLANT
PACK OPEN HEART (CUSTOM PROCEDURE TRAY) ×3 IMPLANT
PAD ARMBOARD 7.5X6 YLW CONV (MISCELLANEOUS) ×6 IMPLANT
PAD ELECT DEFIB RADIOL ZOLL (MISCELLANEOUS) ×3 IMPLANT
PENCIL BUTTON HOLSTER BLD 10FT (ELECTRODE) ×3 IMPLANT
PUNCH AORTIC ROTATE 4.0MM (MISCELLANEOUS) IMPLANT
PUNCH AORTIC ROTATE 4.5MM 8IN (MISCELLANEOUS) ×3 IMPLANT
PUNCH AORTIC ROTATE 5MM 8IN (MISCELLANEOUS) IMPLANT
SET CARDIOPLEGIA MPS 5001102 (MISCELLANEOUS) ×3 IMPLANT
SET IRRIG TUBING LAPAROSCOPIC (IRRIGATION / IRRIGATOR) ×3 IMPLANT
SOLUTION ANTI FOG 6CC (MISCELLANEOUS) ×3 IMPLANT
SPECIMEN JAR SMALL (MISCELLANEOUS) ×3 IMPLANT
SPONGE LAP 18X18 X RAY DECT (DISPOSABLE) ×12 IMPLANT
SPONGE LAP 4X18 X RAY DECT (DISPOSABLE) ×3 IMPLANT
SUT BONE WAX W31G (SUTURE) ×3 IMPLANT
SUT ETHIBON 2 0 V 52N 30 (SUTURE) ×6 IMPLANT
SUT ETHIBON EXCEL 2-0 V-5 (SUTURE) IMPLANT
SUT ETHIBOND 2 0 SH (SUTURE)
SUT ETHIBOND 2 0 SH 36X2 (SUTURE) IMPLANT
SUT ETHIBOND 2 0 V4 (SUTURE) IMPLANT
SUT ETHIBOND 2 0V4 GREEN (SUTURE) IMPLANT
SUT ETHIBOND 4 0 RB 1 (SUTURE) IMPLANT
SUT ETHIBOND V-5 VALVE (SUTURE) IMPLANT
SUT ETHIBOND X763 2 0 SH 1 (SUTURE) ×27 IMPLANT
SUT MNCRL AB 3-0 PS2 18 (SUTURE) ×6 IMPLANT
SUT MNCRL AB 4-0 PS2 18 (SUTURE) ×6 IMPLANT
SUT PDS AB 1 CTX 36 (SUTURE) ×6 IMPLANT
SUT PROLENE 2 0 SH DA (SUTURE) IMPLANT
SUT PROLENE 3 0 SH DA (SUTURE) ×12 IMPLANT
SUT PROLENE 3 0 SH1 36 (SUTURE) ×15 IMPLANT
SUT PROLENE 4 0 RB 1 (SUTURE) ×4
SUT PROLENE 4 0 SH DA (SUTURE) ×3 IMPLANT
SUT PROLENE 4-0 RB1 .5 CRCL 36 (SUTURE) ×8 IMPLANT
SUT PROLENE 5 0 C 1 36 (SUTURE) IMPLANT
SUT PROLENE 6 0 C 1 30 (SUTURE) IMPLANT
SUT PROLENE 7.0 RB 3 (SUTURE) ×15 IMPLANT
SUT PROLENE 8 0 BV175 6 (SUTURE) IMPLANT
SUT PROLENE BLUE 7 0 (SUTURE) ×9 IMPLANT
SUT PROLENE POLY MONO (SUTURE) ×9 IMPLANT
SUT SILK  1 MH (SUTURE) ×1
SUT SILK 1 MH (SUTURE) ×2 IMPLANT
SUT SILK 2 0 SH CR/8 (SUTURE) IMPLANT
SUT SILK 3 0 SH CR/8 (SUTURE) IMPLANT
SUT STEEL 6MS V (SUTURE) IMPLANT
SUT STEEL STERNAL CCS#1 18IN (SUTURE) IMPLANT
SUT STEEL SZ 6 DBL 3X14 BALL (SUTURE) ×9 IMPLANT
SUT VIC AB 1 CTX 36 (SUTURE)
SUT VIC AB 1 CTX36XBRD ANBCTR (SUTURE) IMPLANT
SUT VIC AB 2-0 CT1 27 (SUTURE) ×2
SUT VIC AB 2-0 CT1 TAPERPNT 27 (SUTURE) ×4 IMPLANT
SUT VIC AB 2-0 CTX 27 (SUTURE) ×3 IMPLANT
SUT VIC AB 3-0 SH 27 (SUTURE)
SUT VIC AB 3-0 SH 27X BRD (SUTURE) IMPLANT
SUT VIC AB 3-0 X1 27 (SUTURE) IMPLANT
SUT VICRYL 4-0 PS2 18IN ABS (SUTURE) IMPLANT
SYSTEM SAHARA CHEST DRAIN ATS (WOUND CARE) ×3 IMPLANT
TAPE CLOTH SURG 6X10 WHT LF (GAUZE/BANDAGES/DRESSINGS) ×3 IMPLANT
TAPE PAPER 3X10 WHT MICROPORE (GAUZE/BANDAGES/DRESSINGS) ×3 IMPLANT
TOWEL GREEN STERILE (TOWEL DISPOSABLE) ×3 IMPLANT
TOWEL GREEN STERILE FF (TOWEL DISPOSABLE) ×3 IMPLANT
TRAY FOLEY SILVER 16FR TEMP (SET/KITS/TRAYS/PACK) ×3 IMPLANT
TUBING INSUFFLATION (TUBING) ×3 IMPLANT
UNDERPAD 30X30 (UNDERPADS AND DIAPERS) ×3 IMPLANT
VALVE SYSTEM EDWS INTUITY 25A (Prosthesis & Implant Heart) ×3 IMPLANT
VENT LEFT HEART 12002 (CATHETERS) ×3
WATER STERILE IRR 1000ML POUR (IV SOLUTION) ×6 IMPLANT

## 2017-10-17 NOTE — Interval H&P Note (Signed)
History and Physical Interval Note:  10/17/2017 6:39 AM  [name]  has presented today for surgery, with the diagnosis of Severe Aortic Stenosis, Coronary Artery Disease  The various methods of treatment have been discussed with the patient and family. After consideration of risks, benefits and other options for treatment, the patient has consented to  Procedure(s): AORTIC VALVE REPLACEMENT (AVR),CORONARY ARTERY BY-PASS GRAFTING,TEE (N/A) CORONARY ARTERY BYPASS GRAFTING (CABG) (N/A) TRANSESOPHAGEAL ECHOCARDIOGRAM (TEE) (N/A) as a surgical intervention .  The patient's history has been reviewed, patient examined, no change in status, stable for surgery.  I have reviewed the patient's chart and labs.  Questions were answered to the patient's satisfaction.     Purcell Nailslarence H Owen

## 2017-10-17 NOTE — Procedures (Signed)
Extubation Procedure Note  Patient Details:   Name: [name] DOB: 1950-11-22 MRN: [id number]   Airway Documentation:     Evaluation  O2 sats: stable throughout Complications: No apparent complications Patient did tolerate procedure well. Bilateral Breath Sounds: Diminished, Clear   Yes   Positive cuff leak prior to extubation.   Weaning parameters: VC 1.3L NIF -22  Tamyra Fojtik A Luciana Cammarata 10/17/2017, 11:13 PM

## 2017-10-17 NOTE — Anesthesia Procedure Notes (Addendum)
Central Venous Catheter Insertion Performed by: Lewie LoronGermeroth, Caleen Taaffe, MD, anesthesiologist Start/End1/12/2017 7:14 AM, 10/17/2017 7:16 AM Patient location: Pre-op. Preanesthetic checklist: patient identified, IV checked, site marked, risks and benefits discussed, surgical consent, monitors and equipment checked, pre-op evaluation, timeout performed and anesthesia consent Hand hygiene performed  and maximum sterile barriers used  PA cath was placed.Swan type:thermodilution Procedure performed without using ultrasound guided technique. Attempts: 1 Patient tolerated the procedure well with no immediate complications.

## 2017-10-17 NOTE — Transfer of Care (Signed)
Immediate Anesthesia Transfer of Care Note  Patient: Kenneth HerbDavid T Ozawa  Procedure(s) Performed: AORTIC VALVE REPLACEMENT (AVR),CORONARY ARTERY BY-PASS GRAFTING,TEE (N/A Chest) CORONARY ARTERY BYPASS GRAFTING (CABG) time four using right and left saphaneous vien, harvested endoscopicly. and Left internal mammary artery. Coronary endartorectomy times one. (N/A Chest) TRANSESOPHAGEAL ECHOCARDIOGRAM (TEE) (N/A )  Patient Location: SICU  Anesthesia Type:General  Level of Consciousness: sedated and unresponsive  Airway & Oxygen Therapy: Patient remains intubated per anesthesia plan and Patient placed on Ventilator (see vital sign flow sheet for setting)  Post-op Assessment: Report given to RN and Post -op Vital signs reviewed and stable  Post vital signs: Reviewed and stable  Last Vitals:  Vitals:   10/17/17 0604 10/17/17 1559  BP: 126/80 104/63  Pulse: 74 80  Resp: 18 12  Temp: 36.8 C   SpO2: 99% 100%    Last Pain:  Vitals:   10/17/17 0604  TempSrc: Oral      Patients Stated Pain Goal: 4 (10/17/17 [id number])  Complications: No apparent anesthesia complications

## 2017-10-17 NOTE — Anesthesia Procedure Notes (Addendum)
Arterial Line Insertion Start/End1/12/2017 7:19 AM, 10/17/2017 7:27 AM Performed by: Lewie LoronGermeroth, Braxson Hollingsworth, MD, anesthesiologist  Patient location: Pre-op. Preanesthetic checklist: patient identified, IV checked, site marked, risks and benefits discussed, surgical consent, monitors and equipment checked, pre-op evaluation, timeout performed and anesthesia consent Lidocaine 1% used for infiltration Left, brachial was placed Catheter size: 20 Fr Hand hygiene performed  and maximum sterile barriers used   Attempts: 1 Procedure performed using ultrasound guided technique. Ultrasound Notes:anatomy identified, needle tip was noted to be adjacent to the nerve/plexus identified and no ultrasound evidence of intravascular and/or intraneural injection Following insertion, dressing applied and Biopatch. Post procedure assessment: normal and unchanged

## 2017-10-17 NOTE — Progress Notes (Signed)
      [address]       [place] [id number]             [phone]      S/p AVR CABG  Intubated  BP (!) 86/77 (BP Location: Right Arm)   Pulse 90   Temp 98.6 F (37 C)   Resp (!) 23   SpO2 100%  PA36/15, CI= 2.3  Intake/Output Summary (Last 24 hours) at 10/17/2017 1812 Last data filed at 10/17/2017 1700 Gross per 24 hour  Intake 4286.84 ml  Output 3300 ml  Net 986.84 ml   Hct= 27, K= 3.8  Doing well early postop  Viviann SpareSteven C. Dorris FetchHendrickson, MD [facility] [phone]

## 2017-10-17 NOTE — Anesthesia Procedure Notes (Signed)
Procedure Name: Intubation Date/Time: 10/17/2017 7:13 AM Performed by: Marena ChancyBeckner, Rosie Torrez S, CRNA Pre-anesthesia Checklist: Patient identified, Emergency Drugs available, Suction available and Patient being monitored Patient Re-evaluated:Patient Re-evaluated prior to induction Oxygen Delivery Method: Circle System Utilized Preoxygenation: Pre-oxygenation with 100% oxygen Induction Type: IV induction Ventilation: Mask ventilation without difficulty Laryngoscope Size: Miller and 2 Grade View: Grade I Tube type: Oral Tube size: 8.0 mm Number of attempts: 1 Airway Equipment and Method: Stylet and Oral airway Placement Confirmation: ETT inserted through vocal cords under direct vision,  positive ETCO2 and breath sounds checked- equal and bilateral Tube secured with: Tape Dental Injury: Teeth and Oropharynx as per pre-operative assessment

## 2017-10-17 NOTE — Progress Notes (Signed)
  Echocardiogram Echocardiogram Transesophageal has been performed.  Janalyn HarderWest, Kaytlen Lightsey R 10/17/2017, 8:56 AM

## 2017-10-17 NOTE — Progress Notes (Signed)
Dr. Cornelius Moraswen updated on pt's status - core temp 101.8 despite PO tylenol and ice packs, neo @ 50 mcg, and pt is slow to wake up after sedation turned off. Verbal order received to administer an additional albumin if needed. Will implement and continue to monitor pt closely .

## 2017-10-17 NOTE — Brief Op Note (Signed)
10/17/2017  3:32 PM  PATIENT:  [name]  67 y.o. male  PRE-OPERATIVE DIAGNOSIS:  Severe Aortic Stenosis, Coronary Artery Disease  POST-OPERATIVE DIAGNOSIS:  Severe Aortic Stenosis, Coronary Artery Disease  PROCEDURE:  Procedure(s): AORTIC VALVE REPLACEMENT (AVR),CORONARY ARTERY BY-PASS GRAFTING,TEE (N/A) CORONARY ARTERY BYPASS GRAFTING (CABG) time four using right and left saphaneous vien, harvested endoscopicly. and Left internal mammary artery. Coronary endartorectomy times one. (N/A) TRANSESOPHAGEAL ECHOCARDIOGRAM (TEE) (N/A)  SURGEON:  Surgeon(s) and Role:    * Purcell Nailswen, Manda Holstad H, MD - Primary  PHYSICIAN ASSISTANT:  Jari Favreessa Conte, PA-C   ANESTHESIA:   general  EBL:  900 mL   BLOOD ADMINISTERED:none  DRAINS: routine   LOCAL MEDICATIONS USED:  NONE  SPECIMEN:  Source of Specimen:  aortic valve leaflets  DISPOSITION OF SPECIMEN:  PATHOLOGY  COUNTS:  YES  TOURNIQUET:  * No tourniquets in log *  DICTATION: .Dragon Dictation  PLAN OF CARE: Admit to inpatient   PATIENT DISPOSITION:  ICU - intubated and hemodynamically stable.   Delay start of Pharmacological VTE agent (>24hrs) due to surgical blood loss or risk of bleeding: yes

## 2017-10-17 NOTE — Op Note (Addendum)
CARDIOTHORACIC SURGERY OPERATIVE NOTE  Date of Procedure:  10/17/2017  Preoperative Diagnosis:   Severe Aortic Stenosis  Severe 3-vessel Coronary Artery Disease  Unstable Angina Pectoris  Postoperative Diagnosis: Same  Procedure:    Aortic Valve Replacement  Edwards Intuity Elite rapid-deployment Stented Bovine Pericardial Tissue Valve (size 25 mm, model # 6222LN, serial # [id number])   Coronary Artery Bypass Grafting x 4 with Coronary Endarterectomy x1  Left Internal Mammary Artery to Distal Left Anterior Descending Coronary Artery with Coronary Endarterectomy  Saphenous Vein Graft to Distal Right Coronary Artery  Saphenous Vein Graft to First Diagonal Branch Coronary Artery  Saphenous Vein Graft to Obtuse Marginal Branch of Left Circumflex Coronary Artery  Endoscopic Vein Harvest from Bilateral Thighs and Lower Legs  Surgeon: Valentina Gu. Roxy Manns, MD  Assistant: Nicholes Rough, PA-C  Anesthesia: Nolon Nations, MD  Operative Findings:  Severe aortic stenosis  Normal left ventricular systolic function  Mild mitral regurgitation  Moderate tricuspid regurgitation  Good quality left internal mammary artery conduit  Good quality saphenous vein conduit  Poor quality target vessels for grafting  Diffuse partially calcified adhesions throughout the mediastinum obliterating the pericardial space  Severe atherosclerotic plaque in ascending thoracic aorta          BRIEF CLINICAL NOTE AND INDICATIONS FOR SURGERY  Patient is a 67 year old male with history of aortic stenosis, coronary artery disease with multiple previous PCI and stents, hypertension, lupus, remote history of DVT with reported hypercoagulable state on long-term warfarin anticoagulation, GE reflux disease, Barrett's esophagus,and hyperlipidemia who has been referred for surgical consultation to discuss treatment options for management of severe aortic stenosis and multivessel coronary artery disease. Patient  has a long history of coronary artery disease dating back several years. He had reported history of PCI and stenting of the obtuse marginal branch of the left circumflex coronary artery, left anterior descending coronary artery, and the right coronary artery, all of which were performed at [facility]. Approximately 4 years ago the patient moved to [facility] where he has been followed by Dr. Percival Spanish.Catheterization in May 2016 revealed patent stents in the left anterior descending coronary artery with 70% in-stent stenosis, and patent stents in both the left circumflex and the right coronary distributions. He was treated medically. He has known history of heart murmur and previous echocardiograms have demonstrated the presence of mild aortic stenosis with normal left ventricular systolic function. Follow-up echocardiogram performed July 18, 2017 revealed significant progression of disease with moderate to severe aortic stenosis. Peak velocity across the aortic valve range from 3.2-4.0 m/s corresponding to mean transvalvular gradient estimated 38 mmHg. The DVI was reported 0.19. Medical therapy was recommended.   The patient states that he has developed progressive symptoms of fatigue and over the past month classical symptoms of angina pectoris. He states that initially in the morning he feels well and he usually goes to the [facility] to exercise on a regular basis. By the end of the afternoon he gets fatigued easily and developed substernal chest tightness. Symptoms have gotten much worse over the past month. Following the recent snowstorm he was carrying a pile of wood to his house and developed severe substernal chest pain for which she took nitroglycerin. The chest pain finally resolved. Since then he has had more frequent episodes of chest pain and chest tightness with moderate and low-level activity. He was seen in the office on September 26, 2017 by Laurine Blazer scheduled  for elective outpatient catheterization on October 02, 2017. Catheterization revealed severe  multivessel coronary artery disease including 80% ostial stenosis of the left anterior descending coronary artery, 80% stenosis of the mid left anterior descending coronary artery, ostial left circumflex stenosis, and 95% in-stent restenosis in the mid right coronary artery. The patient was hospitalized and cardiothoracic surgical consultation requested. I initially saw him in consultation at that time.  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 He recovered uneventfully and was seen in follow-up recently by Dr. Derwood Kaplan VVSbecause of increased pain surrounding his right knee. Follow-up duplex revealed thrombosed pseudoaneurysm and no DVT,and his pain was felt to be related to chronic orthopedic problems related to his right knee. He returned to our office today and reports that he is looking forward to putting his cardiac surgery behind him. The patient has been counseled at length regarding the indications, risks and potential benefits of surgery.  All questions have been answered, and the patient provides full informed consent for the operation as described.     DETAILS OF THE OPERATIVE PROCEDURE  Preparation:  The patient is brought to the operating room on the above mentioned date and central monitoring was established by the anesthesia team including placement of Swan-Ganz catheter and radial arterial line. The patient is placed in the supine position on the operating table.  Intravenous antibiotics are administered. General endotracheal anesthesia is induced uneventfully. A Foley catheter is placed.  Baseline transesophageal echocardiogram was performed.  Findings were notable for severe aortic stenosis.  The aortic valve is trileaflet.  There was trace aortic insufficiency.  There was normal left  ventricular systolic function with mild left ventricular hypertrophy.  There was mild central mitral regurgitation.  There was mild to moderate tricuspid regurgitation.  The patient's chest, abdomen, both groins, and both lower extremities are prepared and draped in a sterile manner. A time out procedure is performed.   Surgical Approach and Conduit Harvest:  A median sternotomy incision was performed and the left internal mammary artery is dissected from the chest wall and prepared for bypass grafting. The left internal mammary artery is notably good quality conduit. Simultaneously, saphenous vein is obtained from the patient's left thigh using endoscopic vein harvest technique. The saphenous vein is notably poor quality conduit other than one segment of vein, so an additional segment of saphenous vein is obtained to the patient's right thigh using endoscopic vein harvest technique.  The greater saphenous vein from the right thigh was notably good quality.. After removal of the saphenous vein, the small surgical incisions in the lower extremity are closed with absorbable suture. Following systemic heparinization, the left internal mammary artery was transected distally noted to have excellent flow.   Extracorporeal Cardiopulmonary Bypass and Myocardial Protection:  The pericardium is opened.  There are diffuse adhesions throughout the pericardial space, essentially obliterating the pericardial space.  The adhesions are chronic and associated with numerous areas of dense calcification.  Sharp dissection is utilized to free up the tissue surrounding the ascending aorta and the right.  The ascending aorta is moderately diseased with atherosclerosis in appearance. The ascending aorta and the right atrium are cannulated for cardioplegia bypass.  Adequate heparinization is verified.   The entire pre-bypass portion of the operation was notable for stable hemodynamics.  Cardiopulmonary bypass was begun.   Sharp dissection and electrocautery was utilized to free up all of the adhesions surrounding the epicardial surface of the heart.  This is somewhat tedious.   A retrograde cardioplegia cannula is placed  through the right atrium into the coronary sinus and a left ventricular vent placed through the right superior pulmonary vein.  The operative field was continuously flooded with carbon dioxide gas.  A cardioplegia cannula is placed in the ascending aorta.  A temperature probe was placed in the interventricular septum.  The patient is cooled to 28C systemic temperature.  The aortic cross clamp is applied and cardioplegia is delivered initially in an antegrade fashion through the aortic root using modified [place] cold blood cardioplegia (Kennestone blood cardioplegia protocol).   The initial cardioplegic arrest is rapid with early diastolic arrest.  Repeat doses of cardioplegia are administered intermittently throughout the entire cross clamp portion of the operation through the aortic root, through the coronary sinus catheter, and through subsequently placed vein grafts in order to maintain completely flat electrocardiogram and septal myocardial temperature below 15C.  Myocardial protection was felt to be excellent.   Coronary Artery Bypass Grafting:   The distal right coronary artery was grafted using a reversed saphenous vein graft in an end-to-side fashion.  At the site of distal anastomosis the target vessel was diffusely diseased and poor quality but measured approximately 1.7 mm in diameter.  The obtuse marginal branch of the left circumflex coronary artery was grafted using a reversed saphenous vein graft in an end-to-side fashion.  At the site of distal anastomosis the target vessel was diffusely diseased and poor quality but measured approximately 2.0 mm in diameter.  The first diagonal branch of the left anterior descending coronary artery was grafted using a reversed saphenous vein graft in  an end-to-side fashion.  At the site of distal anastomosis the target vessel was diffusely diseased and poor quality and measured approximately 1.2 mm in diameter.  The distal left anterior coronary artery was grafted with the left internal mammary artery in an end-to-side fashion.  The distal left anterior descending coronary artery was diffusely diseased with hard calcified plaque.  No reasonable site for conventional grafting was identified.  Ultimately a site was chosen to open the vessel but endarterectomy was required.  Everting style endarterectomy was performed with modest success.     Aortic Valve Replacement:  An oblique transverse aortotomy incision was performed.  There was severe atherosclerotic plaque in the ascending aorta with areas of calcification.  The aortic valve was inspected and notable for severe aortic stenosis.  The aortic valve was trileaflet.  The aortic valve leaflets were excised sharply and the aortic annulus decalcified.  Decalcification was notably straightforward.  The aortic annulus was sized to accept a 25 mm prosthesis.  The aortic root and left ventricle were irrigated with copious cold saline solution.  Aortic valve replacement was performed using an [employer] rapid deployment pericardial tissue valve (size 25 mm, model #[id number], serial # [id number]).  The valve was rinsed in saline for manufacture recommendations. A total of 4 individual guiding sutures are placed through the aortic annulus, each at the corresponding nadir of each sinus of Valsalva with 2 sutures in the non-coronary sinus.  The valve was attached to the delivery device and the 4 guiding sutures placed through the valve sewing cuff. The valve is lowered into place. Care is taken to insure that the valve is completely seated in the annulus. Each of the guide sutures are secured using Cor knot clips.  The valve stent is deployed by inflating the deployment balloon to 5.0 atm pressure and holding  for 10 seconds. The balloon is deflated the valve holder sutures cut  In the deployment system removed.   The valve is carefully inspected to make sure that it is seated appropriately. Endoscopic visualization through the valve is utilized to inspect the left ventricular outflow tract. Aortic root was filled with saline to make sure the valve is competent. Rewarming is begun.  The aortotomy is closed using a 2 layer closure of running 3-0 Prolene suture with Teflon felt strips to buttress the suture line.   Procedure Completion:  All proximal vein graft anastomoses were placed directly to the ascending aorta prior to removal of the aortic cross clamp.  The septal myocardial temperature rose rapidly after reperfusion of the left internal mammary artery graft.  One final dose of warm retrograde "reanimation dose" cardioplegia was administered through the coronary sinus catheter while all air was evacuated through the aortic root.  The aortic cross clamp was removed after a total cross clamp time of 159 minutes.  All proximal and distal coronary anastomoses were inspected for hemostasis and appropriate graft orientation. Epicardial pacing wires are fixed to the right ventricular outflow tract and to the right atrial appendage. The patient is rewarmed to 37C temperature. The aortic and left ventricular vents were removed.  The patient is weaned and disconnected from cardiopulmonary bypass.  The patient's rhythm at separation from bypass was sinus.  The patient was weaned from cardiopulmonary bypass without any inotropic support. Total cardiopulmonary bypass time for the operation was 209 minutes.  Followup transesophageal echocardiogram performed after separation from bypass revealed a well-seated bioprosthetic tissue valve in the aortic position that was functioning normally.  There was no perivalvular leak.  There were otherwise no changes from the preoperative exam.  The aortic and venous cannula were  removed uneventfully. Protamine was administered to reverse the anticoagulation. The mediastinum and pleural space were inspected for hemostasis and irrigated with saline solution. The mediastinum and the left pleural space were drained using 3 chest tubes placed through separate stab incisions inferiorly.  The soft tissues anterior to the aorta were reapproximated loosely. The sternum is closed with double strength sternal wire. The soft tissues anterior to the sternum were closed in multiple layers and the skin is closed with a running subcuticular skin closure.  The post-bypass portion of the operation was notable for stable rhythm and hemodynamics.  No blood products were administered during the operation.   Disposition:  The patient tolerated the procedure well and is transported to the surgical intensive care in stable condition. There are no intraoperative complications. All sponge instrument and needle counts are verified correct at completion of the operation.   Valentina Gu. Roxy Manns MD 10/17/2017 3:35 PM

## 2017-10-17 NOTE — Anesthesia Procedure Notes (Addendum)
Central Venous Catheter Insertion Performed by: Lewie LoronGermeroth, Tameika Heckmann, MD, anesthesiologist Start/End1/12/2017 7:09 AM, 10/17/2017 7:19 AM Patient location: Pre-op. Preanesthetic checklist: patient identified, IV checked, site marked, risks and benefits discussed, surgical consent, monitors and equipment checked, pre-op evaluation, timeout performed and anesthesia consent Position: Trendelenburg Lidocaine 1% used for infiltration and patient sedated Hand hygiene performed , maximum sterile barriers used  and Seldinger technique used Catheter size: 8.5 Fr Central line and PA cath was placed.Sheath introducer Swan type:thermodilation Procedure performed using ultrasound guided technique. Ultrasound Notes:anatomy identified, needle tip was noted to be adjacent to the nerve/plexus identified and no ultrasound evidence of intravascular and/or intraneural injection Attempts: 1 Following insertion, line sutured, dressing applied and Biopatch. Post procedure assessment: blood return through all ports, free fluid flow and no air  Patient tolerated the procedure well with no immediate complications.

## 2017-10-18 ENCOUNTER — Encounter (HOSPITAL_COMMUNITY): Payer: Self-pay | Admitting: Thoracic Surgery (Cardiothoracic Vascular Surgery)

## 2017-10-18 ENCOUNTER — Inpatient Hospital Stay (HOSPITAL_COMMUNITY): Payer: PPO

## 2017-10-18 LAB — CBC
HCT: 24 % — ABNORMAL LOW (ref 39.0–52.0)
HCT: 23.4 % — ABNORMAL LOW (ref 39.0–52.0)
HEMOGLOBIN: 7.7 g/dL — AB (ref 13.0–17.0)
Hemoglobin: 7.5 g/dL — ABNORMAL LOW (ref 13.0–17.0)
MCH: 26.8 pg (ref 26.0–34.0)
MCH: 26.9 pg (ref 26.0–34.0)
MCHC: 32.1 g/dL (ref 30.0–36.0)
MCHC: 32.1 g/dL (ref 30.0–36.0)
MCV: 83.6 fL (ref 78.0–100.0)
MCV: 83.9 fL (ref 78.0–100.0)
PLATELETS: 146 10*3/uL — AB (ref 150–400)
PLATELETS: 150 10*3/uL (ref 150–400)
RBC: 2.87 MIL/uL — AB (ref 4.22–5.81)
RBC: 2.79 MIL/uL — AB (ref 4.22–5.81)
RDW: 16 % — ABNORMAL HIGH (ref 11.5–15.5)
RDW: 16.1 % — AB (ref 11.5–15.5)
WBC: 6.2 10*3/uL (ref 4.0–10.5)
WBC: 8.5 10*3/uL (ref 4.0–10.5)

## 2017-10-18 LAB — MAGNESIUM
MAGNESIUM: 2.1 mg/dL (ref 1.7–2.4)
MAGNESIUM: 2 mg/dL (ref 1.7–2.4)

## 2017-10-18 LAB — POCT I-STAT, CHEM 8
BUN: 21 mg/dL — ABNORMAL HIGH (ref 6–20)
CALCIUM ION: 1.17 mmol/L (ref 1.15–1.40)
Chloride: 101 mmol/L (ref 101–111)
Creatinine, Ser: 0.8 mg/dL (ref 0.61–1.24)
GLUCOSE: 156 mg/dL — AB (ref 65–99)
HCT: 24 % — ABNORMAL LOW (ref 39.0–52.0)
HEMOGLOBIN: 8.2 g/dL — AB (ref 13.0–17.0)
Potassium: 4.5 mmol/L (ref 3.5–5.1)
Sodium: 139 mmol/L (ref 135–145)
TCO2: 24 mmol/L (ref 22–32)

## 2017-10-18 LAB — GLUCOSE, CAPILLARY
GLUCOSE-CAPILLARY: 106 mg/dL — AB (ref 65–99)
GLUCOSE-CAPILLARY: 156 mg/dL — AB (ref 65–99)
GLUCOSE-CAPILLARY: 126 mg/dL — AB (ref 65–99)
Glucose-Capillary: 102 mg/dL — ABNORMAL HIGH (ref 65–99)
Glucose-Capillary: 106 mg/dL — ABNORMAL HIGH (ref 65–99)
Glucose-Capillary: 115 mg/dL — ABNORMAL HIGH (ref 65–99)
Glucose-Capillary: 155 mg/dL — ABNORMAL HIGH (ref 65–99)

## 2017-10-18 LAB — BASIC METABOLIC PANEL
Anion gap: 6 (ref 5–15)
BUN: 14 mg/dL (ref 6–20)
CHLORIDE: 110 mmol/L (ref 101–111)
CO2: 22 mmol/L (ref 22–32)
Calcium: 8 mg/dL — ABNORMAL LOW (ref 8.9–10.3)
Creatinine, Ser: 0.75 mg/dL (ref 0.61–1.24)
GFR calc Af Amer: >60 mL/min (ref >60)
Glucose, Bld: 104 mg/dL — ABNORMAL HIGH (ref 65–99)
POTASSIUM: 4.4 mmol/L (ref 3.5–5.1)
SODIUM: 138 mmol/L (ref 135–145)

## 2017-10-18 LAB — CREATININE, SERUM: CREATININE: 0.78 mg/dL (ref 0.61–1.24)

## 2017-10-18 LAB — POCT I-STAT 3, ART BLOOD GAS (G3+)
ACID-BASE DEFICIT: 5 mmol/L — AB (ref 0.0–2.0)
BICARBONATE: 20.3 mmol/L (ref 20.0–28.0)
O2 Saturation: 98 %
PH ART: 7.335 — AB (ref 7.350–7.450)
TCO2: 21 mmol/L — ABNORMAL LOW (ref 22–32)
pCO2 arterial: 38.7 mmHg (ref 32.0–48.0)
pO2, Arterial: 120 mmHg — ABNORMAL HIGH (ref 83.0–108.0)

## 2017-10-18 LAB — PROTIME-INR
INR: 1.44
Prothrombin Time: 17.4 seconds — ABNORMAL HIGH (ref 11.4–15.2)

## 2017-10-18 MED ORDER — WARFARIN - PHYSICIAN DOSING INPATIENT
Freq: Every day | Status: DC
  Administered 2017-10-18 – 2017-10-21 (×3)

## 2017-10-18 MED ORDER — FUROSEMIDE 10 MG/ML IJ SOLN
20.0000 mg | Freq: Three times a day (TID) | INTRAMUSCULAR | Status: AC
  Administered 2017-10-18 (×3): 20 mg via INTRAVENOUS
  Filled 2017-10-18 (×3): qty 2

## 2017-10-18 MED ORDER — ASPIRIN 81 MG PO CHEW
324.0000 mg | CHEWABLE_TABLET | Freq: Every day | ORAL | Status: DC
  Administered 2017-10-18 – 2017-10-19 (×2): 324 mg via ORAL
  Filled 2017-10-18 (×2): qty 4

## 2017-10-18 MED ORDER — KETOROLAC TROMETHAMINE 15 MG/ML IJ SOLN
15.0000 mg | Freq: Four times a day (QID) | INTRAMUSCULAR | Status: AC
  Administered 2017-10-18 – 2017-10-19 (×5): 15 mg via INTRAVENOUS
  Filled 2017-10-18 (×5): qty 1

## 2017-10-18 MED ORDER — METOCLOPRAMIDE HCL 5 MG/ML IJ SOLN
5.0000 mg | Freq: Three times a day (TID) | INTRAMUSCULAR | Status: AC
  Administered 2017-10-18 – 2017-10-19 (×3): 5 mg via INTRAVENOUS
  Filled 2017-10-18 (×3): qty 2

## 2017-10-18 MED ORDER — ATORVASTATIN CALCIUM 80 MG PO TABS
80.0000 mg | ORAL_TABLET | Freq: Every day | ORAL | Status: DC
  Administered 2017-10-19 – 2017-10-22 (×4): 80 mg via ORAL
  Filled 2017-10-18 (×4): qty 1

## 2017-10-18 MED ORDER — ENOXAPARIN SODIUM 30 MG/0.3ML ~~LOC~~ SOLN
30.0000 mg | Freq: Every day | SUBCUTANEOUS | Status: DC
  Administered 2017-10-18 – 2017-10-21 (×4): 30 mg via SUBCUTANEOUS
  Filled 2017-10-18 (×4): qty 0.3

## 2017-10-18 MED ORDER — ENOXAPARIN SODIUM 30 MG/0.3ML ~~LOC~~ SOLN: 30.0000 mg | Freq: Every day | SUBCUTANEOUS | Status: DC

## 2017-10-18 MED ORDER — WARFARIN SODIUM 4 MG PO TABS
4.0000 mg | ORAL_TABLET | Freq: Every day | ORAL | Status: DC
  Administered 2017-10-18 – 2017-10-22 (×5): 4 mg via ORAL
  Filled 2017-10-18 (×3): qty 2
  Filled 2017-10-18: qty 1
  Filled 2017-10-18: qty 2

## 2017-10-18 MED FILL — Lidocaine HCl IV Inj 20 MG/ML: INTRAVENOUS | Qty: 25 | Status: AC

## 2017-10-18 MED FILL — Mannitol IV Soln 20%: INTRAVENOUS | Qty: 1000 | Status: AC

## 2017-10-18 MED FILL — Electrolyte-R (PH 7.4) Solution: INTRAVENOUS | Qty: 4000 | Status: AC

## 2017-10-18 MED FILL — Magnesium Sulfate Inj 50%: INTRAMUSCULAR | Qty: 10 | Status: AC

## 2017-10-18 MED FILL — Heparin Sodium (Porcine) Inj 1000 Unit/ML: INTRAMUSCULAR | Qty: 30 | Status: AC

## 2017-10-18 MED FILL — Potassium Chloride Inj 2 mEq/ML: INTRAVENOUS | Qty: 40 | Status: AC

## 2017-10-18 MED FILL — Sodium Bicarbonate IV Soln 8.4%: INTRAVENOUS | Qty: 50 | Status: AC

## 2017-10-18 MED FILL — Sodium Chloride IV Soln 0.9%: INTRAVENOUS | Qty: 3000 | Status: AC

## 2017-10-18 MED FILL — Heparin Sodium (Porcine) Inj 1000 Unit/ML: INTRAMUSCULAR | Qty: 20 | Status: AC

## 2017-10-18 NOTE — Evaluation (Signed)
Physical Therapy Evaluation Patient Details Name: [name] MRN: [id number] DOB: February 02, 1951 [name] Date: 10/18/2017   History of Present Illness  Pt is a 67 y.o. male now s/p AVR and CABGx4 on 10/17/17. Pertinent PMH includes aortic stenosis, CAD with multiple previous PCI and stents, HTN, lupus, remote h/o DVT, Barrett's esophagus, HLD,  R knee sx.     Clinical Impression  Pt presents with an overall decrease in functional mobility secondary to above. Pt with h/o R knee pain that was exacerbated during recent hospital admission in 09/2017, since then mod indep with use of SPC or RW; prior to this, indep with all mobility and [facility]. Lives with wife and has family available for 24/7 assist. Educ on precautions and importance of mobility. Today, pt able to transfer and ambulate with Kenneth HammedEva walker and min guard (+2 safety). Feel that once pain is under control, pt will progress quickly with mobility. Pt would benefit from continued acute PT services to maximize functional mobility and independence prior to d/c with HHPT.     Follow Up Recommendations Home health PT;Supervision - Intermittent    Equipment Recommendations  None recommended by PT    Recommendations for Other Services OT consult     Precautions / Restrictions Precautions Precautions: Fall;Sternal Precaution Comments: Chest tube Restrictions Other Position/Activity Restrictions: Sternal      Mobility  Bed Mobility Overal bed mobility: Needs Assistance Bed Mobility: Supine to Sit;Sit to Sidelying;Rolling Rolling: Min assist   Supine to sit: Min assist;HOB elevated   Sit to sidelying: Mod assist General bed mobility comments: MinA to assist hips to EOB. ModA to assist BLEs back into bed and scoot up  Transfers Overall transfer level: Needs assistance Equipment used: None Transfers: Sit to/from Stand Sit to Stand: Min guard         General transfer comment: Able to stand well with bilat hands on knees;  min guard for balance, but no physical assist required  Ambulation/Gait Ambulation/Gait assistance: Min guard;+2 safety/equipment Ambulation Distance (Feet): 100 Feet Assistive device: 4-wheeled walker([name] walker) Gait Pattern/deviations: Step-through pattern;Decreased stride length;Antalgic;Trunk flexed Gait velocity: Decreased Gait velocity interpretation: <1.8 ft/sec, indicative of risk for recurrent falls General Gait Details: Slow, controlled amb with [name] walker and intermittent min guard for balance (+2 lines). Pt c/o back pain throughout, requiring intermittent cues to maintain upright posture  Stairs            Wheelchair Mobility    Modified Rankin (Stroke Patients Only)       Balance Overall balance assessment: Needs assistance   Sitting balance-Leahy Scale: Fair       Standing balance-Leahy Scale: Fair                               Pertinent Vitals/Pain Pain Assessment: Faces Faces Pain Scale: Hurts even more Pain Location: Back with mobility Pain Descriptors / Indicators: Aching;Sore;Grimacing;Guarding Pain Intervention(s): Monitored during session    Home Living Family/patient expects to be discharged to:: Private residence Living Arrangements: Spouse/significant other Available Help at Discharge: Family;Available 24 hours/day Type of Home: House Home Access: Stairs to enter Entrance Stairs-Rails: None Entrance Stairs-Number of Steps: 2 Home Layout: One level Home Equipment: Walker - 2 wheels;Cane - single point      Prior Function Level of Independence: Independent with assistive device(s)         Comments: Since recent hospital admission 09/2017, R knee pain (initial sx ~10 years  ago) has returned and pt requiring SPC or RW secondary to this. Before return of this pain in December, pt indep with all mobility and active at [facility]        Extremity/Trunk Assessment   Upper Extremity Assessment Upper  Extremity Assessment: Overall WFL for tasks assessed    Lower Extremity Assessment Lower Extremity Assessment: RLE deficits/detail RLE Deficits / Details: h/o R knee sx (~10 yrs ago); pt wears brace for stability - grossly 3/5 throughout       Communication   Communication: No difficulties  Cognition Arousal/Alertness: Awake/alert Behavior During Therapy: WFL for tasks assessed/performed Overall Cognitive Status: Within Functional Limits for tasks assessed                                        General Comments General comments (skin integrity, edema, etc.): Daughter present during session    Exercises     Assessment/Plan    PT Assessment Patient needs continued PT services  PT Problem List Decreased strength;Decreased activity tolerance;Decreased balance;Decreased range of motion;Decreased mobility;Decreased cognition;Decreased knowledge of use of DME;Decreased knowledge of precautions;Cardiopulmonary status limiting activity;Pain       PT Treatment Interventions DME instruction;Gait training;Stair training;Functional mobility training;Therapeutic activities;Therapeutic exercise;Balance training;Patient/family education    PT Goals (Current goals can be found in the Care Plan section)  Acute Rehab PT Goals Patient Stated Goal: Return home; get knee fixed PT Goal Formulation: With patient Time For Goal Achievement: 11/01/17 Potential to Achieve Goals: Good    Frequency Min 3X/week   Barriers to discharge        Co-evaluation               AM-PAC PT "6 Clicks" Daily Activity  Outcome Measure Difficulty turning over in bed (including adjusting bedclothes, sheets and blankets)?: Unable Difficulty moving from lying on back to sitting on the side of the bed? : Unable Difficulty sitting down on and standing up from a chair with arms (e.g., wheelchair, bedside commode, etc,.)?: A Little Help needed moving to and from a bed to chair (including a  wheelchair)?: A Little Help needed walking in hospital room?: A Little Help needed climbing 3-5 steps with a railing? : A Little 6 Click Score: 14    End of Session Equipment Utilized During Treatment: Gait belt;Oxygen Activity Tolerance: Patient tolerated treatment well Patient left: in bed;with call bell/phone within reach;with nursing/sitter in room Nurse Communication: Mobility status PT Visit Diagnosis: Other abnormalities of gait and mobility (R26.89);Pain Pain - part of body: (Sternum)    Time: 4098-1191 PT Time Calculation (min) (ACUTE ONLY): 35 min   Charges:   PT Evaluation $PT Eval Moderate Complexity: 1 Mod PT Treatments $Gait Training: 8-22 mins   PT G Codes:       [name], PT, DPT Acute Rehab Services  Pager: [phone]  [name] 10/18/2017, 5:38 PM

## 2017-10-18 NOTE — Progress Notes (Signed)
Patient ID: [name], male   DOB: 1950-11-11, 67 y.o.   MRN: [id number] EVENING ROUNDS NOTE :     [address]       [place] [id number]             [phone]                 1 Day Post-Op Procedure(s) (LRB): AORTIC VALVE REPLACEMENT (AVR),CORONARY ARTERY BY-PASS GRAFTING,TEE (N/A) CORONARY ARTERY BYPASS GRAFTING (CABG) time four using right and left saphaneous vien, harvested endoscopicly. and Left internal mammary artery. Coronary endartorectomy times one. (N/A) TRANSESOPHAGEAL ECHOCARDIOGRAM (TEE) (N/A)  Total Length of Stay:  LOS: 1 day  BP 105/64   Pulse 66   Temp 98.4 F (36.9 C) (Bladder)   Resp 20   Wt 166 lb 3.6 oz (75.4 kg)   SpO2 97%   BMI 24.55 kg/m   .Intake/Output      01/03 0701 - 01/04 0700 01/04 0701 - 01/05 0700   P.O.  720   I.V. (mL/kg) 4927.1 (65.3) 375 (5)   Blood 560    NG/GT 30    IV Piggyback 1550 50   Total Intake(mL/kg) 7067.1 (93.7) 1145 (15.2)   Urine (mL/kg/hr) 3965 (2.2) 630 (0.8)   Emesis/NG output 0 100   Blood 900    Chest Tube 400 130   Total Output 5265 860   Net +1802.1 +285          . sodium chloride    . cefUROXime (ZINACEF)  IV Stopped (10/18/17 0920)  . lactated ringers    . lactated ringers    . lactated ringers 20 mL/hr at 10/18/17 0600     Lab Results  Component Value Date   WBC 6.2 10/18/2017   HGB 7.7 (L) 10/18/2017   HCT 24.0 (L) 10/18/2017   PLT 146 (L) 10/18/2017   GLUCOSE 104 (H) 10/18/2017   CHOL 76 10/04/2017   TRIG 77 10/04/2017   HDL 21 (L) 10/04/2017   LDLCALC 40 10/04/2017   ALT 24 10/16/2017   AST 24 10/16/2017   NA 138 10/18/2017   K 4.4 10/18/2017   CL 110 10/18/2017   CREATININE 0.75 10/18/2017   BUN 14 10/18/2017   CO2 22 10/18/2017   TSH 3.05 10/26/2016   PSA 0.07 (L) 10/26/2016   INR 1.44 10/18/2017   HGBA1C 6.1 (H) 10/16/2017   Nausea most of day, not much improvement with zofran Add reglan  Abdomen soft not tender or distended or tender    Delight OvensEdward B  Mrytle Bento MD  Beeper [phone] Office [phone] 10/18/2017 5:40 PM

## 2017-10-18 NOTE — Addendum Note (Signed)
Addendum  created 10/18/17 0816 by Lewie LoronGermeroth, Flavia Bruss, MD   Intraprocedure Blocks edited, Pend clinical note, Sign clinical note

## 2017-10-18 NOTE — Anesthesia Postprocedure Evaluation (Signed)
Anesthesia Post Note  Patient: Chipper HerbDavid T Graziosi  Procedure(s) Performed: AORTIC VALVE REPLACEMENT (AVR),CORONARY ARTERY BY-PASS GRAFTING,TEE (N/A Chest) CORONARY ARTERY BYPASS GRAFTING (CABG) time four using right and left saphaneous vien, harvested endoscopicly. and Left internal mammary artery. Coronary endartorectomy times one. (N/A Chest) TRANSESOPHAGEAL ECHOCARDIOGRAM (TEE) (N/A )     Patient location during evaluation: SICU Anesthesia Type: General Level of consciousness: sedated Pain management: pain level controlled Vital Signs Assessment: post-procedure vital signs reviewed and stable Respiratory status: patient remains intubated per anesthesia plan Cardiovascular status: stable Postop Assessment: no apparent nausea or vomiting Anesthetic complications: no    Last Vitals:  Vitals:   10/18/17 0645 10/18/17 0700  BP:  106/66  Pulse: 86 85  Resp: 18 (!) 27  Temp: 37.7 C 37.5 C  SpO2: 98% 99%    Last Pain:  Vitals:   10/18/17 0115  TempSrc:   PainSc: Asleep                 Janeah Kovacich S

## 2017-10-18 NOTE — Progress Notes (Signed)
[address]       [name] [address]             [phone]        CARDIOTHORACIC SURGERY PROGRESS NOTE   R1 Day Post-Op Procedure(s) (LRB): AORTIC VALVE REPLACEMENT (AVR),CORONARY ARTERY BY-PASS GRAFTING,TEE (N/A) CORONARY ARTERY BYPASS GRAFTING (CABG) time four using right and left saphaneous vien, harvested endoscopicly. and Left internal mammary artery. Coronary endartorectomy times one. (N/A) TRANSESOPHAGEAL ECHOCARDIOGRAM (TEE) (N/A)  Subjective: Looks good.  Expected soreness in chest.  Denies SOB, nausea  Objective: Vital signs: BP Readings from Last 1 Encounters:  10/18/17 99/68   Pulse Readings from Last 1 Encounters:  10/18/17 86   Resp Readings from Last 1 Encounters:  10/18/17 (!) 24   Temp Readings from Last 1 Encounters:  10/18/17 99.1 F (37.3 C)    Hemodynamics: PAP: (27-48)/(7-25) 31/15 CO:  [2.8 L/min-4.8 L/min] 4.8 L/min CI:  [1.5 L/min/m2-2.6 L/min/m2] 2.6 L/min/m2  Physical Exam:  Rhythm:   sinus  Breath sounds: clear  Heart sounds:  RRR  Incisions:  Dressings dry, intact  Abdomen:  Soft, non-distended, non-tender  Extremities:  Warm, well-perfused  Chest tubes:  low volume thin serosanguinous output, no air leak    Intake/Output from previous day: 01/03 0701 - 01/04 0700 In: 7067.1 [I.V.:4927.1; Blood:560; NG/GT:30; IV Piggyback:1550] Out: 5265 [Urine:3965; Blood:900; Chest Tube:400] Intake/Output this shift: Total I/O In: 290 [P.O.:240; IV Piggyback:50] Out: 205 [Urine:175; Chest Tube:30]  Lab Results:  CBC: Recent Labs    10/17/17 2142 10/17/17 2155 10/18/17 0354  WBC 6.4  --  6.2  HGB 7.8* 8.2* 7.7*  HCT 25.2* 24.0* 24.0*  PLT 166  --  146*    BMET:  Recent Labs    10/16/17 1250  10/17/17 2155 10/18/17 0354  NA 135   < > 141 138  K 4.2   < > 5.3* 4.4  CL 103   < > 107 110  CO2 23  --   --  22  GLUCOSE 93   < > 124* 104*  BUN 18   < > 16 14  CREATININE 0.69   < > 0.60* 0.75  CALCIUM  9.2  --   --  8.0*   < > = values in this interval not displayed.     PT/INR:   Recent Labs    10/17/17 1559  LABPROT 17.7*  INR 1.47    CBG (last 3)  Recent Labs    10/18/17 0013 10/18/17 0408 10/18/17 0801  GLUCAP 106* 106* 102*    ABG    Component Value Date/Time   PHART 7.335 (L) 10/18/2017 0015   PCO2ART 38.7 10/18/2017 0015   PO2ART 120.0 (H) 10/18/2017 0015   HCO3 20.3 10/18/2017 0015   TCO2 21 (L) 10/18/2017 0015   ACIDBASEDEF 5.0 (H) 10/18/2017 0015   O2SAT 98.0 10/18/2017 0015    CXR: PORTABLE CHEST 1 VIEW  COMPARISON:  10/17/2017  FINDINGS: Status post extubation. Swan-Ganz catheter, thoracic, mediastinal and pericardial catheters persist. Stable postsurgical changes from CABG and aortic valve replacement.  Enlarged cardiac silhouette.  There is no evidence of scratched pneumothorax. Low lung volumes with bibasilar atelectasis, left greater than right.  Osseous structures are without acute abnormality. Soft tissues are grossly normal.  IMPRESSION: Low lung volumes with bibasilar atelectasis, left greater than right.  Enlarged cardiac silhouette.  Post extubation. Postsurgical support apparatus otherwise unchanged.   Electronically Signed   By: Ulanda Edison.D.  On: 10/18/2017 07:47   EKG: NSR w/out acute ischemic changes, 1st degree AV block, new LBBB  Assessment/Plan: S/P Procedure(s) (LRB): AORTIC VALVE REPLACEMENT (AVR),CORONARY ARTERY BY-PASS GRAFTING,TEE (N/A) CORONARY ARTERY BYPASS GRAFTING (CABG) time four using right and left saphaneous vien, harvested endoscopicly. and Left internal mammary artery. Coronary endartorectomy times one. (N/A) TRANSESOPHAGEAL ECHOCARDIOGRAM (TEE) (N/A)  Doing well POD1 Maintaining NSR w/ stable hemodynamics, no drips 1st degree AV block and new LBBB Breathing comfortably w/ O2 sats 96-99% on 4 L/min Expected post op acute blood loss anemia w/ chronic anemia preop, Hgb 7.7  stable Expected post op atelectasis, mild-moderate Expected post op volume excess, weight reportedly only 1 lb > preop Longstanding SLE - not on chronic immunosuppression recently Possible hypercoagulable state with h/o DVT in remote past, on chronic warfarin anticoagulation preop GERD, hiatal hernia, Barrett's esophagus Chronic right knee pain Recent injection of false aneurysm right common femoral artery after cardiac cath PVD Hyperlipidemia   Mobilize  D/C lines D/C tubes later today or tomorrow, depending on output  Gentle diuresis  Hold beta blockers for now  Low dose lovenox and restart warfarin  PT consult to assist w/ mobility   Purcell Nailslarence H Lisle Skillman, MD 10/18/2017 9:30 AM

## 2017-10-19 ENCOUNTER — Inpatient Hospital Stay (HOSPITAL_COMMUNITY): Payer: PPO

## 2017-10-19 LAB — CBC
HCT: 22.7 % — ABNORMAL LOW (ref 39.0–52.0)
Hemoglobin: 7.2 g/dL — ABNORMAL LOW (ref 13.0–17.0)
MCH: 26.3 pg (ref 26.0–34.0)
MCHC: 31.7 g/dL (ref 30.0–36.0)
MCV: 82.8 fL (ref 78.0–100.0)
PLATELETS: 154 10*3/uL (ref 150–400)
RBC: 2.74 MIL/uL — ABNORMAL LOW (ref 4.22–5.81)
RDW: 16.1 % — ABNORMAL HIGH (ref 11.5–15.5)
WBC: 8.5 10*3/uL (ref 4.0–10.5)

## 2017-10-19 LAB — GLUCOSE, CAPILLARY
GLUCOSE-CAPILLARY: 108 mg/dL — AB (ref 65–99)
GLUCOSE-CAPILLARY: 117 mg/dL — AB (ref 65–99)
GLUCOSE-CAPILLARY: 134 mg/dL — AB (ref 65–99)
Glucose-Capillary: 106 mg/dL — ABNORMAL HIGH (ref 65–99)
Glucose-Capillary: 99 mg/dL (ref 65–99)
Glucose-Capillary: 123 mg/dL — ABNORMAL HIGH (ref 65–99)

## 2017-10-19 LAB — PREPARE RBC (CROSSMATCH)

## 2017-10-19 LAB — BASIC METABOLIC PANEL
ANION GAP: 7 (ref 5–15)
BUN: 28 mg/dL — ABNORMAL HIGH (ref 6–20)
CO2: 26 mmol/L (ref 22–32)
CREATININE: 0.88 mg/dL (ref 0.61–1.24)
Calcium: 8 mg/dL — ABNORMAL LOW (ref 8.9–10.3)
Chloride: 103 mmol/L (ref 101–111)
GLUCOSE: 111 mg/dL — AB (ref 65–99)
Potassium: 4.3 mmol/L (ref 3.5–5.1)
Sodium: 136 mmol/L (ref 135–145)

## 2017-10-19 LAB — PROTIME-INR
INR: 1.31
PROTHROMBIN TIME: 16.2 s — AB (ref 11.4–15.2)

## 2017-10-19 MED ORDER — ACETAMINOPHEN 160 MG/5ML PO SOLN
1000.0000 mg | Freq: Once | ORAL | Status: AC
  Administered 2017-10-19: 1000 mg via ORAL
  Filled 2017-10-19: qty 40.6

## 2017-10-19 MED ORDER — FUROSEMIDE 10 MG/ML IJ SOLN
40.0000 mg | Freq: Once | INTRAMUSCULAR | Status: AC
  Administered 2017-10-19: 40 mg via INTRAVENOUS
  Filled 2017-10-19: qty 4

## 2017-10-19 MED ORDER — ACETAMINOPHEN 160 MG/5ML PO SOLN
1000.0000 mg | Freq: Once | ORAL | Status: AC
  Administered 2017-10-20: 1000 mg via ORAL
  Filled 2017-10-19: qty 40.6

## 2017-10-19 NOTE — Progress Notes (Signed)
Patient ID: PHI AVANS, male   DOB: 18-Jun-1951, 67 y.o.   MRN: [id number] TCTS DAILY ICU PROGRESS NOTE                   [address]            [place] [address]          [phone]   2 Days Post-Op Procedure(s) (LRB): AORTIC VALVE REPLACEMENT (AVR),CORONARY ARTERY BY-PASS GRAFTING,TEE (N/A) CORONARY ARTERY BYPASS GRAFTING (CABG) time four using right and left saphaneous vien, harvested endoscopicly. and Left internal mammary artery. Coronary endartorectomy times one. (N/A) TRANSESOPHAGEAL ECHOCARDIOGRAM (TEE) (N/A)  Total Length of Stay:  LOS: 2 days   Subjective: Feels little better this morning, nausea has improved  Objective: Vital signs in last 24 hours: Temp:  [97.8 F (36.6 C)-100.6 F (38.1 C)] 100.6 F (38.1 C) (01/05 0600) Pulse Rate:  [65-86] 84 (01/05 0600) Cardiac Rhythm: A-V Sequential paced (01/04 2000) Resp:  [16-27] 27 (01/05 0700) BP: (82-114)/(59-75) 111/73 (01/05 0700) SpO2:  [92 %-100 %] 97 % (01/05 0600) Arterial Line BP: (84-114)/(35-61) 84/37 (01/04 1400)  Filed Weights   10/18/17 0500  Weight: 166 lb 3.6 oz (75.4 kg)    Weight change:    Hemodynamic parameters for last 24 hours: PAP: (28-42)/(11-20) 31/15  Intake/Output from previous day: 01/04 0701 - 01/05 0700 In: 1595 [P.O.:840; I.V.:655; IV Piggyback:100] Out: 8527 [POEUM:3536; Emesis/NG output:100; Chest Tube:280]  Intake/Output this shift: No intake/output data recorded.  Current Meds: Scheduled Meds: . acetaminophen  1,000 mg Oral Q6H  . aspirin  324 mg Oral Daily  . atorvastatin  80 mg Oral q1800  . bisacodyl  10 mg Oral Daily   Or  . bisacodyl  10 mg Rectal Daily  . brimonidine  1 drop Both Eyes BID  . chlorhexidine gluconate (MEDLINE KIT)  15 mL Mouth Rinse BID  . Chlorhexidine Gluconate Cloth  6 each Topical Daily  . docusate sodium  200 mg Oral Daily  . enoxaparin (LOVENOX) injection  30 mg Subcutaneous QHS  . insulin aspart  0-24 Units  Subcutaneous Q4H  . ketorolac  15 mg Intravenous Q6H  . mouth rinse  15 mL Mouth Rinse QID  . metoCLOPramide (REGLAN) injection  5 mg Intravenous Q8H  . mupirocin ointment  1 application Nasal BID  . pantoprazole  40 mg Oral Daily  . sodium chloride flush  10-40 mL Intracatheter Q12H  . sodium chloride flush  3 mL Intravenous Q12H  . timolol  1 drop Both Eyes BID  . warfarin  4 mg Oral q1800  . Warfarin - Physician Dosing Inpatient   Does not apply q1800   Continuous Infusions: . sodium chloride    . cefUROXime (ZINACEF)  IV 1.5 g (10/18/17 1948)  . lactated ringers    . lactated ringers    . lactated ringers 20 mL/hr at 10/19/17 0700   PRN Meds:.lactated ringers, metoprolol tartrate, morphine injection, ondansetron (ZOFRAN) IV, oxyCODONE, sodium chloride flush, sodium chloride flush, traMADol  General appearance: alert, cooperative and no distress Neurologic: intact Heart: regular rate and rhythm, S1, S2 normal, no murmur, click, rub or gallop Lungs: diminished breath sounds bibasilar Abdomen: soft, non-tender; bowel sounds normal; no masses,  no organomegaly Extremities: extremities normal, atraumatic, no cyanosis or edema and Homans sign is negative, no sign of DVT Wound: Sternum stable dressing intact  Lab Results: CBC: Recent Labs    10/18/17 1725 10/18/17 1744 10/19/17 0411  WBC 8.5  --  8.5  HGB 7.5* 8.2* 7.2*  HCT 23.4* 24.0* 22.7*  PLT 150  --  154   BMET:  Recent Labs    10/18/17 0354  10/18/17 1744 10/19/17 0411  NA 138  --  139 136  K 4.4  --  4.5 4.3  CL 110  --  101 103  CO2 22  --   --  26  GLUCOSE 104*  --  156* 111*  BUN 14  --  21* 28*  CREATININE 0.75   < > 0.80 0.88  CALCIUM 8.0*  --   --  8.0*   < > = values in this interval not displayed.    CMET: Lab Results  Component Value Date   WBC 8.5 10/19/2017   HGB 7.2 (L) 10/19/2017   HCT 22.7 (L) 10/19/2017   PLT 154 10/19/2017   GLUCOSE 111 (H) 10/19/2017   CHOL 76 10/04/2017   TRIG  77 10/04/2017   HDL 21 (L) 10/04/2017   LDLCALC 40 10/04/2017   ALT 24 10/16/2017   AST 24 10/16/2017   NA 136 10/19/2017   K 4.3 10/19/2017   CL 103 10/19/2017   CREATININE 0.88 10/19/2017   BUN 28 (H) 10/19/2017   CO2 26 10/19/2017   TSH 3.05 10/26/2016   PSA 0.07 (L) 10/26/2016   INR 1.31 10/19/2017   HGBA1C 6.1 (H) 10/16/2017      PT/INR:  Recent Labs    10/19/17 0411  LABPROT 16.2*  INR 1.31   Radiology: No results found.   Assessment/Plan: S/P Procedure(s) (LRB): AORTIC VALVE REPLACEMENT (AVR),CORONARY ARTERY BY-PASS GRAFTING,TEE (N/A) CORONARY ARTERY BYPASS GRAFTING (CABG) time four using right and left saphaneous vien, harvested endoscopicly. and Left internal mammary artery. Coronary endartorectomy times one. (N/A) TRANSESOPHAGEAL ECHOCARDIOGRAM (TEE) (N/A) Mobilize Diuresis Diabetes control d/c tubes/lines Patient on Coumadin INR 1.3 this morning Expected Acute  Blood - loss Anemia- continue to monitor -with hemoglobin down to 7.2 now will transfuse 1 unit packed red blood cells Left bundle branch block on EKG    [name] 10/19/2017 7:54 AM

## 2017-10-19 NOTE — Progress Notes (Signed)
Patient ID: [name], male   DOB: May 22, 1951, 67 y.o.   MRN: [id number] EVENING ROUNDS NOTE :     [address]       [place] [id number]             [phone]                 2 Days Post-Op Procedure(s) (LRB): AORTIC VALVE REPLACEMENT (AVR),CORONARY ARTERY BY-PASS GRAFTING,TEE (N/A) CORONARY ARTERY BYPASS GRAFTING (CABG) time four using right and left saphaneous vien, harvested endoscopicly. and Left internal mammary artery. Coronary endartorectomy times one. (N/A) TRANSESOPHAGEAL ECHOCARDIOGRAM (TEE) (N/A)  Total Length of Stay:  LOS: 2 days  BP 110/68   Pulse 70   Temp 97.7 F (36.5 C) (Oral)   Resp 20   Wt 166 lb 3.6 oz (75.4 kg)   SpO2 95%   BMI 24.55 kg/m   .Intake/Output      01/04 0701 - 01/05 0700 01/05 0701 - 01/06 0700   P.O. 840    I.V. (mL/kg) 655 (8.7) 160 (2.1)   Blood  315   NG/GT     IV Piggyback 100    Total Intake(mL/kg) 1595 (21.2) 475 (6.3)   Urine (mL/kg/hr) 1535 (0.8) 925 (1)   Emesis/NG output 100    Blood     Chest Tube 280 40   Total Output 1915 965   Net -320 -490          . sodium chloride    . lactated ringers    . lactated ringers    . lactated ringers Stopped (10/19/17 1500)     Lab Results  Component Value Date   WBC 8.5 10/19/2017   HGB 7.2 (L) 10/19/2017   HCT 22.7 (L) 10/19/2017   PLT 154 10/19/2017   GLUCOSE 111 (H) 10/19/2017   CHOL 76 10/04/2017   TRIG 77 10/04/2017   HDL 21 (L) 10/04/2017   LDLCALC 40 10/04/2017   ALT 24 10/16/2017   AST 24 10/16/2017   NA 136 10/19/2017   K 4.3 10/19/2017   CL 103 10/19/2017   CREATININE 0.88 10/19/2017   BUN 28 (H) 10/19/2017   CO2 26 10/19/2017   TSH 3.05 10/26/2016   PSA 0.07 (L) 10/26/2016   INR 1.31 10/19/2017   HGBA1C 6.1 (H) 10/16/2017   Stable day, feels bettr, nausea improved  Follow up hgb in am    Delight OvensEdward B Matison Nuccio MD  Beeper [phone] Office [phone] 10/19/2017 6:43 PM

## 2017-10-20 ENCOUNTER — Inpatient Hospital Stay (HOSPITAL_COMMUNITY): Payer: PPO

## 2017-10-20 LAB — BASIC METABOLIC PANEL
Anion gap: 6 (ref 5–15)
BUN: 31 mg/dL — ABNORMAL HIGH (ref 6–20)
CO2: 25 mmol/L (ref 22–32)
Calcium: 7.8 mg/dL — ABNORMAL LOW (ref 8.9–10.3)
Chloride: 104 mmol/L (ref 101–111)
Creatinine, Ser: 0.83 mg/dL (ref 0.61–1.24)
GFR calc Af Amer: >60 mL/min (ref >60)
GFR calc non Af Amer: >60 mL/min (ref >60)
Glucose, Bld: 116 mg/dL — ABNORMAL HIGH (ref 65–99)
Potassium: 3.8 mmol/L (ref 3.5–5.1)
Sodium: 135 mmol/L (ref 135–145)

## 2017-10-20 LAB — TYPE AND SCREEN
ABO/RH(D): B POS
ANTIBODY SCREEN: NEGATIVE
UNIT DIVISION: 0

## 2017-10-20 LAB — CBC
HCT: 24.8 % — ABNORMAL LOW (ref 39.0–52.0)
Hemoglobin: 7.6 g/dL — ABNORMAL LOW (ref 13.0–17.0)
MCH: 25.7 pg — ABNORMAL LOW (ref 26.0–34.0)
MCHC: 30.6 g/dL (ref 30.0–36.0)
MCV: 83.8 fL (ref 78.0–100.0)
Platelets: 175 10*3/uL (ref 150–400)
RBC: 2.96 MIL/uL — ABNORMAL LOW (ref 4.22–5.81)
RDW: 15.6 % — ABNORMAL HIGH (ref 11.5–15.5)
WBC: 8.7 10*3/uL (ref 4.0–10.5)

## 2017-10-20 LAB — GLUCOSE, CAPILLARY
GLUCOSE-CAPILLARY: 115 mg/dL — AB (ref 65–99)
GLUCOSE-CAPILLARY: 128 mg/dL — AB (ref 65–99)
Glucose-Capillary: 93 mg/dL (ref 65–99)
Glucose-Capillary: 112 mg/dL — ABNORMAL HIGH (ref 65–99)
Glucose-Capillary: 103 mg/dL — ABNORMAL HIGH (ref 65–99)

## 2017-10-20 LAB — PROTIME-INR
INR: 1.34
Prothrombin Time: 16.4 seconds — ABNORMAL HIGH (ref 11.4–15.2)

## 2017-10-20 LAB — BPAM RBC
BLOOD PRODUCT EXPIRATION DATE: 201901302359
ISSUE DATE / TIME: 201901050931
UNIT TYPE AND RH: 7300

## 2017-10-20 LAB — PREPARE RBC (CROSSMATCH)

## 2017-10-20 MED ORDER — FUROSEMIDE 10 MG/ML IJ SOLN
40.0000 mg | Freq: Once | INTRAMUSCULAR | Status: AC
  Administered 2017-10-20: 40 mg via INTRAVENOUS
  Filled 2017-10-20: qty 4

## 2017-10-20 MED ORDER — DIPHENHYDRAMINE HCL 50 MG/ML IJ SOLN
12.5000 mg | Freq: Once | INTRAMUSCULAR | Status: AC
  Administered 2017-10-20: 12.5 mg via INTRAVENOUS
  Filled 2017-10-20: qty 1

## 2017-10-20 MED ORDER — SODIUM CHLORIDE 0.9 % IV SOLN
Freq: Once | INTRAVENOUS | Status: AC
  Administered 2017-10-20: 13:00:00 via INTRAVENOUS

## 2017-10-20 MED ORDER — ASPIRIN 81 MG PO CHEW
81.0000 mg | CHEWABLE_TABLET | Freq: Every day | ORAL | Status: DC
  Administered 2017-10-20: 81 mg via ORAL
  Filled 2017-10-20: qty 1

## 2017-10-20 NOTE — Progress Notes (Addendum)
Patient ID: [name], male   DOB: 03-07-1951, 67 y.o.   MRN: [id number] TCTS DAILY ICU PROGRESS NOTE                   [address]            [place] [address]          [phone]   3 Days Post-Op Procedure(s) (LRB): AORTIC VALVE REPLACEMENT (AVR),CORONARY ARTERY BY-PASS GRAFTING,TEE (N/A) CORONARY ARTERY BYPASS GRAFTING (CABG) time four using right and left saphaneous vien, harvested endoscopicly. and Left internal mammary artery. Coronary endartorectomy times one. (N/A) TRANSESOPHAGEAL ECHOCARDIOGRAM (TEE) (N/A)  Total Length of Stay:  LOS: 3 days   Subjective: Overall patient feels better this morning, some congestion during the night, ambulated says his breathing is better  Objective: Vital signs in last 24 hours: Temp:  [97.5 F (36.4 C)-99.3 F (37.4 C)] 98.5 F (36.9 C) (01/06 0300) Pulse Rate:  [41-87] 74 (01/06 0700) Cardiac Rhythm: Heart block (01/06 0600) Resp:  [12-26] 16 (01/06 0700) BP: (97-131)/(59-80) 131/68 (01/06 0700) SpO2:  [93 %-100 %] 99 % (01/06 0700) Weight:  [166 lb (75.3 kg)] 166 lb (75.3 kg) (01/06 0600)  Filed Weights   10/18/17 0500 10/20/17 0600  Weight: 166 lb 3.6 oz (75.4 kg) 166 lb (75.3 kg)    Weight change:    Hemodynamic parameters for last 24 hours:    Intake/Output from previous day: 01/05 0701 - 01/06 0700 In: 835 [P.O.:360; I.V.:160; Blood:315] Out: 1670 [Urine:1630; Chest Tube:40]  Intake/Output this shift: No intake/output data recorded.  Current Meds: Scheduled Meds: . acetaminophen  1,000 mg Oral Q6H  . aspirin  324 mg Oral Daily  . atorvastatin  80 mg Oral q1800  . bisacodyl  10 mg Oral Daily   Or  . bisacodyl  10 mg Rectal Daily  . brimonidine  1 drop Both Eyes BID  . chlorhexidine gluconate (MEDLINE KIT)  15 mL Mouth Rinse BID  . Chlorhexidine Gluconate Cloth  6 each Topical Daily  . docusate sodium  200 mg Oral Daily  . enoxaparin (LOVENOX) injection  30 mg Subcutaneous QHS  . insulin  aspart  0-24 Units Subcutaneous Q4H  . mouth rinse  15 mL Mouth Rinse QID  . mupirocin ointment  1 application Nasal BID  . pantoprazole  40 mg Oral Daily  . sodium chloride flush  10-40 mL Intracatheter Q12H  . sodium chloride flush  3 mL Intravenous Q12H  . timolol  1 drop Both Eyes BID  . warfarin  4 mg Oral q1800  . Warfarin - Physician Dosing Inpatient   Does not apply q1800   Continuous Infusions: . sodium chloride    . lactated ringers    . lactated ringers    . lactated ringers Stopped (10/19/17 1500)   PRN Meds:.lactated ringers, metoprolol tartrate, morphine injection, ondansetron (ZOFRAN) IV, oxyCODONE, sodium chloride flush, sodium chloride flush, traMADol  General appearance: alert and cooperative Neurologic: intact Heart: regular rate and rhythm, S1, S2 normal, no murmur, click, rub or gallop Lungs: diminished breath sounds RLL Abdomen: soft, non-tender; bowel sounds normal; no masses,  no organomegaly Extremities: extremities normal, atraumatic, no cyanosis or edema and Homans sign is negative, no sign of DVT Wound: Sternum is stable incision clean  Lab Results: CBC: Recent Labs    10/19/17 0411 10/20/17 0323  WBC 8.5 8.7  HGB 7.2* 7.6*  HCT 22.7* 24.8*  PLT 154 175   BMET:  Recent  Labs    10/19/17 0411 10/20/17 0323  NA 136 135  K 4.3 3.8  CL 103 104  CO2 26 25  GLUCOSE 111* 116*  BUN 28* 31*  CREATININE 0.88 0.83  CALCIUM 8.0* 7.8*    CMET: Lab Results  Component Value Date   WBC 8.7 10/20/2017   HGB 7.6 (L) 10/20/2017   HCT 24.8 (L) 10/20/2017   PLT 175 10/20/2017   GLUCOSE 116 (H) 10/20/2017   CHOL 76 10/04/2017   TRIG 77 10/04/2017   HDL 21 (L) 10/04/2017   LDLCALC 40 10/04/2017   ALT 24 10/16/2017   AST 24 10/16/2017   NA 135 10/20/2017   K 3.8 10/20/2017   CL 104 10/20/2017   CREATININE 0.83 10/20/2017   BUN 31 (H) 10/20/2017   CO2 25 10/20/2017   TSH 3.05 10/26/2016   PSA 0.07 (L) 10/26/2016   INR 1.34 10/20/2017    HGBA1C 6.1 (H) 10/16/2017      PT/INR:  Recent Labs    10/20/17 0323  LABPROT 16.4*  INR 1.34   Radiology: No results found.   Assessment/Plan: S/P Procedure(s) (LRB): AORTIC VALVE REPLACEMENT (AVR),CORONARY ARTERY BY-PASS GRAFTING,TEE (N/A) CORONARY ARTERY BYPASS GRAFTING (CABG) time four using right and left saphaneous vien, harvested endoscopicly. and Left internal mammary artery. Coronary endartorectomy times one. (N/A) TRANSESOPHAGEAL ECHOCARDIOGRAM (TEE) (N/A) Mobilize Diuresis Hemoglobin still low 7.6 up from 7.2 after 1 unit of blood yesterday Creatinine stable On Coumadin 4 mg a day pro time 16.4 INR 1.34 today,  Trans-fuse 1 additional unit of blood today DC Foley Some rate controlled afib yesterday - sinus this am  [name] 10/20/2017 8:01 AM

## 2017-10-20 NOTE — Progress Notes (Signed)
Pt reporting concern he may be having blood reaction. Symptom of itching. No rash/hives noted. Daughter at bedside. VSS. Blood transfusion stopped and NS hung. Dr. Tyrone SageGerhardt and BB notified.

## 2017-10-20 NOTE — Progress Notes (Signed)
Patient ID: [name], male   DOB: 1951/08/12, 67 y.o.   MRN: [id number] EVENING ROUNDS NOTE :     [address]       [place] [id number]             [phone]                 3 Days Post-Op Procedure(s) (LRB): AORTIC VALVE REPLACEMENT (AVR),CORONARY ARTERY BY-PASS GRAFTING,TEE (N/A) CORONARY ARTERY BYPASS GRAFTING (CABG) time four using right and left saphaneous vien, harvested endoscopicly. and Left internal mammary artery. Coronary endartorectomy times one. (N/A) TRANSESOPHAGEAL ECHOCARDIOGRAM (TEE) (N/A)  Total Length of Stay:  LOS: 3 days  BP (!) 146/88   Pulse 79   Temp 98.1 F (36.7 C) (Oral)   Resp 16   Wt 166 lb (75.3 kg)   SpO2 96%   BMI 24.51 kg/m   .Intake/Output      01/05 0701 - 01/06 0700 01/06 0701 - 01/07 0700   P.O. 360 480   I.V. (mL/kg) 160 (2.1)    Blood 315 332.7   IV Piggyback     Total Intake(mL/kg) 835 (11.1) 812.7 (10.8)   Urine (mL/kg/hr) 1630 (0.9) 1500 (1.7)   Emesis/NG output     Chest Tube 40    Total Output 1670 1500   Net -835 -687.3          . sodium chloride    . lactated ringers    . lactated ringers    . lactated ringers Stopped (10/19/17 1500)     Lab Results  Component Value Date   WBC 8.7 10/20/2017   HGB 7.6 (L) 10/20/2017   HCT 24.8 (L) 10/20/2017   PLT 175 10/20/2017   GLUCOSE 116 (H) 10/20/2017   CHOL 76 10/04/2017   TRIG 77 10/04/2017   HDL 21 (L) 10/04/2017   LDLCALC 40 10/04/2017   ALT 24 10/16/2017   AST 24 10/16/2017   NA 135 10/20/2017   K 3.8 10/20/2017   CL 104 10/20/2017   CREATININE 0.83 10/20/2017   BUN 31 (H) 10/20/2017   CO2 25 10/20/2017   TSH 3.05 10/26/2016   PSA 0.07 (L) 10/26/2016   INR 1.34 10/20/2017   HGBA1C 6.1 (H) 10/16/2017   Stable day, some itching after most of blood in, follow up cbc in am   Kenneth OvensEdward B Yobani Schertzer MD  Beeper [phone] Office [phone] 10/20/2017 6:35 PM

## 2017-10-21 ENCOUNTER — Inpatient Hospital Stay (HOSPITAL_COMMUNITY): Payer: PPO

## 2017-10-21 ENCOUNTER — Other Ambulatory Visit: Payer: Self-pay

## 2017-10-21 LAB — POCT I-STAT 3, ART BLOOD GAS (G3+)
Acid-Base Excess: 1 mmol/L (ref 0.0–2.0)
BICARBONATE: 25 mmol/L (ref 20.0–28.0)
O2 Saturation: 100 %
PCO2 ART: 36.8 mmHg (ref 32.0–48.0)
PH ART: 7.437 (ref 7.350–7.450)
TCO2: 26 mmol/L (ref 22–32)
pO2, Arterial: 371 mmHg — ABNORMAL HIGH (ref 83.0–108.0)

## 2017-10-21 LAB — CBC
HCT: 26.2 % — ABNORMAL LOW (ref 39.0–52.0)
Hemoglobin: 8.5 g/dL — ABNORMAL LOW (ref 13.0–17.0)
MCH: 27.5 pg (ref 26.0–34.0)
MCHC: 32.4 g/dL (ref 30.0–36.0)
MCV: 84.8 fL (ref 78.0–100.0)
Platelets: 180 10*3/uL (ref 150–400)
RBC: 3.09 MIL/uL — ABNORMAL LOW (ref 4.22–5.81)
RDW: 16 % — ABNORMAL HIGH (ref 11.5–15.5)
WBC: 7.2 10*3/uL (ref 4.0–10.5)

## 2017-10-21 LAB — TYPE AND SCREEN
ABO/RH(D): B POS
Antibody Screen: NEGATIVE
Unit division: 0

## 2017-10-21 LAB — BASIC METABOLIC PANEL
Anion gap: 6 (ref 5–15)
BUN: 22 mg/dL — ABNORMAL HIGH (ref 6–20)
CO2: 28 mmol/L (ref 22–32)
Calcium: 7.6 mg/dL — ABNORMAL LOW (ref 8.9–10.3)
Chloride: 102 mmol/L (ref 101–111)
Creatinine, Ser: 0.62 mg/dL (ref 0.61–1.24)
GFR calc Af Amer: >60 mL/min (ref >60)
GFR calc non Af Amer: >60 mL/min (ref >60)
Glucose, Bld: 107 mg/dL — ABNORMAL HIGH (ref 65–99)
Potassium: 3.6 mmol/L (ref 3.5–5.1)
Sodium: 136 mmol/L (ref 135–145)

## 2017-10-21 LAB — GLUCOSE, CAPILLARY
GLUCOSE-CAPILLARY: 134 mg/dL — AB (ref 65–99)
GLUCOSE-CAPILLARY: 68 mg/dL (ref 65–99)
Glucose-Capillary: 104 mg/dL — ABNORMAL HIGH (ref 65–99)

## 2017-10-21 LAB — PROTIME-INR
INR: 1.46
Prothrombin Time: 17.6 seconds — ABNORMAL HIGH (ref 11.4–15.2)

## 2017-10-21 LAB — BPAM RBC
Blood Product Expiration Date: 201901302359
ISSUE DATE / TIME: 201901061237
Unit Type and Rh: 7300

## 2017-10-21 MED ORDER — MOVING RIGHT ALONG BOOK
Freq: Once | Status: AC
  Administered 2017-10-21: 09:00:00
  Filled 2017-10-21: qty 1

## 2017-10-21 MED ORDER — RAMIPRIL 5 MG PO CAPS
5.0000 mg | ORAL_CAPSULE | Freq: Every day | ORAL | Status: DC
  Administered 2017-10-21 – 2017-10-23 (×3): 5 mg via ORAL
  Filled 2017-10-21 (×3): qty 1

## 2017-10-21 MED ORDER — ACETAMINOPHEN 160 MG/5ML PO SOLN: 1000.0000 mg | Freq: Once | ORAL | Status: DC

## 2017-10-21 MED ORDER — ACETAMINOPHEN 160 MG/5ML PO SOLN
1000.0000 mg | Freq: Once | ORAL | Status: AC
  Administered 2017-10-21: 1000 mg via ORAL
  Filled 2017-10-21: qty 40.6

## 2017-10-21 MED ORDER — METOPROLOL TARTRATE 12.5 MG HALF TABLET
12.5000 mg | ORAL_TABLET | Freq: Two times a day (BID) | ORAL | Status: DC
  Administered 2017-10-21 – 2017-10-23 (×5): 12.5 mg via ORAL
  Filled 2017-10-21 (×5): qty 1

## 2017-10-21 MED ORDER — POTASSIUM CHLORIDE 10 MEQ/50ML IV SOLN
10.0000 meq | INTRAVENOUS | Status: AC
  Administered 2017-10-21 (×3): 10 meq via INTRAVENOUS
  Filled 2017-10-21 (×3): qty 50

## 2017-10-21 MED ORDER — POTASSIUM CHLORIDE CRYS ER 20 MEQ PO TBCR
20.0000 meq | EXTENDED_RELEASE_TABLET | Freq: Every day | ORAL | Status: DC
  Administered 2017-10-21 – 2017-10-23 (×3): 20 meq via ORAL
  Filled 2017-10-21 (×3): qty 1

## 2017-10-21 MED ORDER — FA-PYRIDOXINE-CYANOCOBALAMIN 2.5-25-2 MG PO TABS
1.0000 | ORAL_TABLET | Freq: Every day | ORAL | Status: DC
  Administered 2017-10-21 – 2017-10-23 (×3): 1 via ORAL
  Filled 2017-10-21 (×3): qty 1

## 2017-10-21 MED ORDER — ACETAMINOPHEN 160 MG/5ML PO SOLN
1000.0000 mg | Freq: Four times a day (QID) | ORAL | Status: AC
  Administered 2017-10-21 – 2017-10-23 (×7): 1000 mg via ORAL
  Filled 2017-10-21 (×7): qty 40.6

## 2017-10-21 MED ORDER — FUROSEMIDE 40 MG PO TABS
40.0000 mg | ORAL_TABLET | Freq: Every day | ORAL | Status: DC
  Administered 2017-10-21 – 2017-10-23 (×3): 40 mg via ORAL
  Filled 2017-10-21 (×3): qty 1

## 2017-10-21 MED ORDER — CALCIUM CARBONATE ANTACID 500 MG PO CHEW
1.0000 | CHEWABLE_TABLET | Freq: Three times a day (TID) | ORAL | Status: DC | PRN
  Administered 2017-10-21 – 2017-10-23 (×3): 200 mg via ORAL
  Filled 2017-10-21 (×3): qty 1

## 2017-10-21 MED ORDER — POTASSIUM CHLORIDE 10 MEQ/50ML IV SOLN
10.0000 meq | INTRAVENOUS | Status: AC
  Administered 2017-10-21 (×2): 10 meq via INTRAVENOUS
  Filled 2017-10-21: qty 50

## 2017-10-21 MED ORDER — ASPIRIN 81 MG PO CHEW
81.0000 mg | CHEWABLE_TABLET | Freq: Every day | ORAL | Status: DC
  Administered 2017-10-21 – 2017-10-23 (×3): 81 mg via ORAL
  Filled 2017-10-21 (×3): qty 1

## 2017-10-21 MED ORDER — METOPROLOL TARTRATE 12.5 MG HALF TABLET: 12.5000 mg | ORAL_TABLET | Freq: Two times a day (BID) | ORAL | Status: DC

## 2017-10-21 NOTE — Progress Notes (Addendum)
T[facility] DAILY ICU PROGRESS NOTE                   [address]            [place] [id number]          [phone]   4 Days Post-Op Procedure(s) (LRB): AORTIC VALVE REPLACEMENT (AVR),CORONARY ARTERY BY-PASS GRAFTING,TEE (N/A) CORONARY ARTERY BYPASS GRAFTING (CABG) time four using right and left saphaneous vien, harvested endoscopicly. and Left internal mammary artery. Coronary endartorectomy times one. (N/A) TRANSESOPHAGEAL ECHOCARDIOGRAM (TEE) (N/A)  Total Length of Stay:  LOS: 4 days   Subjective:  Feeling better.  Wanting to walk.  + diaphoresis  Objective: Vital signs in last 24 hours: Temp:  [97.7 F (36.5 C)-98.7 F (37.1 C)] 98 F (36.7 C) (01/07 0742) Pulse Rate:  [71-94] 81 (01/07 0700) Cardiac Rhythm: Atrial flutter (01/07 0400) Resp:  [9-21] 17 (01/07 0700) BP: (100-146)/(60-93) 112/81 (01/07 0700) SpO2:  [90 %-99 %] 97 % (01/07 0700) Weight:  [166 lb 0.1 oz (75.3 kg)] 166 lb 0.1 oz (75.3 kg) (01/07 0400)  Filed Weights   10/18/17 0500 10/20/17 0600 10/21/17 0400  Weight: 166 lb 3.6 oz (75.4 kg) 166 lb (75.3 kg) 166 lb 0.1 oz (75.3 kg)    Weight change: 0.1 oz (0.003 kg)   Intake/Output from previous day: 01/06 0701 - 01/07 0700 In: 1512.7 [P.O.:1080; Blood:332.7; IV Piggyback:100] Out: 2001 [Urine:2000; Stool:1]  Intake/Output this shift: Total I/O In: 50 [IV Piggyback:50] Out: -   Current Meds: Scheduled Meds: . acetaminophen (TYLENOL) oral liquid 160 mg/5 mL  1,000 mg Oral Q6H  . aspirin  81 mg Oral Daily  . atorvastatin  80 mg Oral q1800  . bisacodyl  10 mg Oral Daily   Or  . bisacodyl  10 mg Rectal Daily  . brimonidine  1 drop Both Eyes BID  . Chlorhexidine Gluconate Cloth  6 each Topical Daily  . docusate sodium  200 mg Oral Daily  . enoxaparin (LOVENOX) injection  30 mg Subcutaneous QHS  . furosemide  40 mg Oral Daily  . mupirocin ointment  1 application Nasal BID  . pantoprazole  40 mg Oral Daily  . sodium chloride flush   10-40 mL Intracatheter Q12H  . sodium chloride flush  3 mL Intravenous Q12H  . timolol  1 drop Both Eyes BID  . warfarin  4 mg Oral q1800  . Warfarin - Physician Dosing Inpatient   Does not apply q1800   Continuous Infusions: . sodium chloride    . lactated ringers    . lactated ringers    . lactated ringers Stopped (10/19/17 1500)  . potassium chloride 10 mEq (10/21/17 0720)   PRN Meds:.lactated ringers, metoprolol tartrate, ondansetron (ZOFRAN) IV, oxyCODONE, sodium chloride flush, sodium chloride flush, traMADol  General appearance: alert, cooperative and no distress Heart: regular rate and rhythm Lungs: diminished breath sounds bibasilar Abdomen: soft, non-tender; bowel sounds normal; no masses,  no organomegaly Extremities: edema + trace, mild pitting Wound: clean and dry  Lab Results: CBC: Recent Labs    10/20/17 0323 10/21/17 0330  WBC 8.7 7.2  HGB 7.6* 8.5*  HCT 24.8* 26.2*  PLT 175 180   BMET:  Recent Labs    10/20/17 0323 10/21/17 0330  NA 135 136  K 3.8 3.6  CL 104 102  CO2 25 28  GLUCOSE 116* 107*  BUN 31* 22*  CREATININE 0.83 0.62  CALCIUM 7.8* 7.6*    CMET: Lab  Results  Component Value Date   WBC 7.2 10/21/2017   HGB 8.5 (L) 10/21/2017   HCT 26.2 (L) 10/21/2017   PLT 180 10/21/2017   GLUCOSE 107 (H) 10/21/2017   CHOL 76 10/04/2017   TRIG 77 10/04/2017   HDL 21 (L) 10/04/2017   LDLCALC 40 10/04/2017   ALT 24 10/16/2017   AST 24 10/16/2017   NA 136 10/21/2017   K 3.6 10/21/2017   CL 102 10/21/2017   CREATININE 0.62 10/21/2017   BUN 22 (H) 10/21/2017   CO2 28 10/21/2017   TSH 3.05 10/26/2016   PSA 0.07 (L) 10/26/2016   INR 1.46 10/21/2017   HGBA1C 6.1 (H) 10/16/2017      PT/INR:  Recent Labs    10/21/17 0330  LABPROT 17.6*  INR 1.46   Radiology: No results found.   Assessment/Plan: S/P Procedure(s) (LRB): AORTIC VALVE REPLACEMENT (AVR),CORONARY ARTERY BY-PASS GRAFTING,TEE (N/A) CORONARY ARTERY BYPASS GRAFTING (CABG)  time four using right and left saphaneous vien, harvested endoscopicly. and Left internal mammary artery. Coronary endartorectomy times one. (N/A) TRANSESOPHAGEAL ECHOCARDIOGRAM (TEE) (N/A)  1. CV- Atrial Fibrillation, rate controlled- will start low dose Lopressor as BP is elevated at times, will also restart home Altace, will d/c EPW today 2. INR 1.46, continue home regimen of 4 mg daily 3. Pulm- no pneumothorax post chest tube removal, atelectasis bilaterally, small effusions, continue IS 4. Renal- creatinine WNL, weight is stable, mild pitting edema.. Will transition to oral Lasix 40 mg daily, supplement K 5. Expected post operative blood loss anemia, Hgb 8.6 after transfusion, will start Foltx 6. Dispo- patient stable, will continue diuretics, see if patient tolerates low dose BB and ACE, continue coumadin, transfer to step down     Lowella Dandy 10/21/2017 7:59 AM    I have seen and examined the patient and agree with the assessment and plan as outlined.  Overall making good progress although Mr Broker appears to be in rate-controlled Afib at this time.  Mobilize.  Supplement potassium.  Continue Coumadin.  Restart ACE-I.  Transfer step down.  Purcell Nails, MD 10/21/2017 8:25 AM

## 2017-10-21 NOTE — Care Management Note (Addendum)
Case Management Note  Patient Details  Name: [name] MRN: [id number] Date of Birth: [birth year]  Subjective/Objective:   From home with wife who will be able to assit him, he has a daughter who can help also, he has a cane and a walker at home, he states he has a bad knee.  He has a PCP and medication coverage. POD 4 CABG, AVR,  Per pt eval rec HHPT, [facility], NCM informed patient that there is a 25.00 co pay with his insurance for each therapy session, he states ok. Will need HHPT orders at discharge with face to face. He will be [facility] with [facility], discussed in LOS.                 Action/Plan: NCM will follow for dc needs.   Expected Discharge Date:                  Expected Discharge Plan:  Home w Home Health Services  In-House Referral:     Discharge planning Services  CM Consult  Post Acute Care Choice:  Home Health Choice offered to:  Patient  DME Arranged:    DME Agency:     HH Arranged:    HH Agency:     Status of Service:  In process, will continue to follow  If discussed at Long Length of Stay Meetings, dates discussed:    Additional Comments:  Leone Havenaylor, Olive Zmuda Clinton, RN 10/21/2017, 12:45 PM

## 2017-10-21 NOTE — Progress Notes (Signed)
[facility] BRIEF SICU PROGRESS NOTE  4 Days Post-Op  S/P Procedure(s) (LRB): AORTIC VALVE REPLACEMENT (AVR),CORONARY ARTERY BY-PASS GRAFTING,TEE (N/A) CORONARY ARTERY BYPASS GRAFTING (CABG) time four using right and left saphaneous vien, harvested endoscopicly. and Left internal mammary artery. Coronary endartorectomy times one. (N/A) TRANSESOPHAGEAL ECHOCARDIOGRAM (TEE) (N/A)   Stable day Back in NSR this afternoon Waiting for bed on 4E for transfer  Plan: Continue current plan  Purcell Nailslarence H Owen, MD 10/21/2017 6:33 PM

## 2017-10-21 NOTE — Plan of Care (Signed)
  Education: Knowledge of General Education information will improve 10/21/2017 1259 - Progressing by Charlott HollerStramoski, Oliana Gowens A, RN   Health Behavior/Discharge Planning: Ability to manage health-related needs will improve 10/21/2017 1259 - Progressing by Charlott HollerStramoski, Laurence Crofford A, RN   Clinical Measurements: Ability to maintain clinical measurements within normal limits will improve 10/21/2017 1259 - Progressing by Charlott HollerStramoski, Jahmani Staup A, RN   Clinical Measurements: Will remain free from infection 10/21/2017 1259 - Progressing by Charlott HollerStramoski, Virjean Boman A, RN   Clinical Measurements: Respiratory complications will improve 10/21/2017 1259 - Progressing by Charlott HollerStramoski, Pieter Fooks A, RN   Clinical Measurements: Cardiovascular complication will be avoided 10/21/2017 1259 - Progressing by Charlott HollerStramoski, Chozen Latulippe A, RN   Clinical Measurements: Respiratory complications will improve 10/21/2017 1259 - Progressing by Charlott HollerStramoski, Rodger Giangregorio A, RN   Clinical Measurements: Cardiovascular complication will be avoided 10/21/2017 1259 - Progressing by Charlott HollerStramoski, Wylma Tatem A, RN   Activity: Risk for activity intolerance will decrease 10/21/2017 1259 - Progressing by Charlott HollerStramoski, Gola Bribiesca A, RN   Nutrition: Adequate nutrition will be maintained 10/21/2017 1259 - Progressing by Charlott HollerStramoski, Virgilene Stryker A, RN   Coping: Level of anxiety will decrease 10/21/2017 1259 - Progressing by Charlott HollerStramoski, Antwane Grose A, RN   Elimination: Will not experience complications related to bowel motility 10/21/2017 1259 - Progressing by Charlott HollerStramoski, Daryle Amis A, RN   Elimination: Will not experience complications related to urinary retention 10/21/2017 1259 - Progressing by Charlott HollerStramoski, Marinda Tyer A, RN

## 2017-10-21 NOTE — Progress Notes (Signed)
Physical Therapy Treatment Patient Details Name: [name] MRN: [id number] DOB: April 09, 1951 Today's Date: 10/21/2017    History of Present Illness Pt is a 67 y.o. male now s/p AVR and CABGx4 on 10/17/17. Pertinent PMH includes aortic stenosis, CAD with multiple previous PCI and stents, HTN, lupus, remote h/o DVT, Barrett's esophagus, HLD,  R knee sx.     PT Comments    Pt progressing well today, ambulated 525' with RW and min-guard A for his 3rd walk of the day. Could feel a difference in fatigue level with RW instead of Carley Hammed but RW functional for home. Also worked on bkwd walking and side stepping. Hr 102 bpm with ambulation. PT will continue to follow.    Follow Up Recommendations  Home health PT;Supervision - Intermittent     Equipment Recommendations  None recommended by PT    Recommendations for Other Services OT consult     Precautions / Restrictions Precautions Precautions: Fall;Sternal Precaution Comments: pt able to verbalize sternal precautions Restrictions Weight Bearing Restrictions: Yes(sternal precautions) RLE Weight Bearing: Weight bearing as tolerated Other Position/Activity Restrictions: Sternal    Mobility  Bed Mobility               General bed mobility comments: pt in chair  Transfers Overall transfer level: Needs assistance Equipment used: Rolling walker (2 wheeled) Transfers: Sit to/from Stand Sit to Stand: Min assist         General transfer comment: pt prefers to hold pillow while standing but needs min A for fwd wt shift, especially from toilet, with this technique.   Ambulation/Gait Ambulation/Gait assistance: Min guard Ambulation Distance (Feet): 525 Feet Assistive device: Rolling walker (2 wheeled) Gait Pattern/deviations: Step-through pattern;Decreased stride length;Antalgic;Trunk flexed Gait velocity: Decreased Gait velocity interpretation: Below normal speed for age/gender General Gait Details: pt fatigued more quickly with RW  and needed 2 standing rest breaks but was able to complete 525' total as well as bkwd walking, side stepping, and performing mental tasks during ambulation without LOB   Stairs            Wheelchair Mobility    Modified Rankin (Stroke Patients Only)       Balance Overall balance assessment: Needs assistance Sitting-balance support: No upper extremity supported Sitting balance-Leahy Scale: Good     Standing balance support: No upper extremity supported Standing balance-Leahy Scale: Fair                              Cognition Arousal/Alertness: Awake/alert Behavior During Therapy: WFL for tasks assessed/performed Overall Cognitive Status: Within Functional Limits for tasks assessed                                        Exercises General Exercises - Lower Extremity Mini-Sqauts: AROM;10 reps;Standing    General Comments General comments (skin integrity, edema, etc.): HR 102 bpm with ambulation      Pertinent Vitals/Pain Pain Assessment: No/denies pain    Home Living Family/patient expects to be discharged to:: Private residence Living Arrangements: Spouse/significant other                  Prior Function            PT Goals (current goals can now be found in the care plan section) Acute Rehab PT Goals Patient Stated Goal: Return home; get knee  fixed PT Goal Formulation: With patient Time For Goal Achievement: 11/01/17 Potential to Achieve Goals: Good Progress towards PT goals: Progressing toward goals    Frequency    Min 3X/week      PT Plan Current plan remains appropriate    Co-evaluation              AM-PAC PT "6 Clicks" Daily Activity  Outcome Measure  Difficulty turning over in bed (including adjusting bedclothes, sheets and blankets)?: Unable Difficulty moving from lying on back to sitting on the side of the bed? : Unable Difficulty sitting down on and standing up from a chair with arms (e.g.,  wheelchair, bedside commode, etc,.)?: Unable Help needed moving to and from a bed to chair (including a wheelchair)?: A Little Help needed walking in hospital room?: A Little Help needed climbing 3-5 steps with a railing? : A Little 6 Click Score: 12    End of Session Equipment Utilized During Treatment: Gait belt Activity Tolerance: Patient tolerated treatment well Patient left: with call bell/phone within reach;in chair;with family/visitor present Nurse Communication: Mobility status PT Visit Diagnosis: Other abnormalities of gait and mobility (R26.89) Pain - part of body: (Sternum)     Time: 1400-1430 PT Time Calculation (min) (ACUTE ONLY): 30 min  Charges:  $Gait Training: 23-37 mins                    G Codes:       Lyanne CoVictoria Regnia Mathwig, PT  Acute Rehab Services  [phone]    Lawana ChambersVictoria L Karlye Ihrig 10/21/2017, 2:50 PM

## 2017-10-22 ENCOUNTER — Encounter (HOSPITAL_COMMUNITY): Payer: Self-pay | Admitting: *Deleted

## 2017-10-22 LAB — BASIC METABOLIC PANEL
ANION GAP: 8 (ref 5–15)
BUN: 17 mg/dL (ref 6–20)
CO2: 27 mmol/L (ref 22–32)
Calcium: 8 mg/dL — ABNORMAL LOW (ref 8.9–10.3)
Chloride: 100 mmol/L — ABNORMAL LOW (ref 101–111)
Creatinine, Ser: 0.6 mg/dL — ABNORMAL LOW (ref 0.61–1.24)
GFR calc Af Amer: >60 mL/min (ref >60)
GFR calc non Af Amer: >60 mL/min (ref >60)
GLUCOSE: 113 mg/dL — AB (ref 65–99)
POTASSIUM: 4.3 mmol/L (ref 3.5–5.1)
Sodium: 135 mmol/L (ref 135–145)

## 2017-10-22 LAB — CBC
HCT: 27 % — ABNORMAL LOW (ref 39.0–52.0)
Hemoglobin: 8.5 g/dL — ABNORMAL LOW (ref 13.0–17.0)
MCH: 26.8 pg (ref 26.0–34.0)
MCHC: 31.5 g/dL (ref 30.0–36.0)
MCV: 85.2 fL (ref 78.0–100.0)
Platelets: 210 10*3/uL (ref 150–400)
RBC: 3.17 MIL/uL — ABNORMAL LOW (ref 4.22–5.81)
RDW: 15.9 % — AB (ref 11.5–15.5)
WBC: 8 10*3/uL (ref 4.0–10.5)

## 2017-10-22 LAB — PROTIME-INR
INR: 2.04
Prothrombin Time: 22.9 seconds — ABNORMAL HIGH (ref 11.4–15.2)

## 2017-10-22 NOTE — Progress Notes (Signed)
   10/22/17 1617  Mobility  Activity Ambulated in hall  Level of Assistance Standby assist, set-up cues, supervision of patient - no hands on  Assistive Device Front wheel walker  Distance Ambulated (ft) 940 ft  Mobility Response Tolerated fair  Bed Position Chair

## 2017-10-22 NOTE — Discharge Summary (Signed)
Physician Discharge Summary  Patient ID: [name] MRN: [id number] DOB/AGE: [id number] y.o.  Admit date: 10/17/2017 Discharge date: 10/23/2017  Admission Diagnoses: Severe aortic stenosis and severe coronary artery disease  Discharge Diagnoses:  Principal Problem:   S/P aortic valve replacement with bioprosthetic valve + CABG x4 Active Problems:   Lupus anticoagulant with hypercoagulable state (HCC)   CAD S/P percutaneous coronary angioplasty: reports 7 stents   Multiple joint pain   Osteoarthritis   Lung involvement in systemic lupus erythematosus (HCC)   Essential hypertension   Unstable angina (HCC)   Severe aortic stenosis   Right leg pain   S/P CABG x 4   Patient Active Problem List   Diagnosis Date Noted  . S/P aortic valve replacement with bioprosthetic valve + CABG x4 10/17/2017  . S/P CABG x 4 10/17/2017  . Right leg pain 10/09/2017  . Pseudoaneurysm (HCC) 10/04/2017  . Severe aortic stenosis   . Unstable angina (HCC) 09/27/2017  . Long term (current) use of anticoagulants 06/26/2017  . Essential hypertension   . [name]'s esophagus 11/11/2014  . Encounter for therapeutic drug monitoring 03/25/2014  . Lung involvement in systemic lupus erythematosus (HCC) 01/20/2014  . Dyspnea 01/20/2014  . Lupus anticoagulant with hypercoagulable state (HCC) 01/14/2014  . Dyslipidemia 01/14/2014  . CAD S/P percutaneous coronary angioplasty: reports 7 stents 01/14/2014  . Multiple joint pain 01/14/2014  . Glaucoma 01/14/2014  . GERD (gastroesophageal reflux disease) 01/14/2014  . Allergic rhinitis 01/14/2014  . Osteoarthritis 01/14/2014    HPI:  Patient is a 67 year old male with history of aortic stenosis, coronary artery disease with multiple previous PCI and stents, hypertension, lupus, remote history of DVT with reported hypercoagulable state on long-term warfarin anticoagulation, GE reflux disease, [name]'s esophagus,and hyperlipidemia who has been referred for  surgical consultation to discuss treatment options for management of severe aortic stenosis and multivessel coronary artery disease. Patient has a long history of coronary artery disease dating back several years. He had reported history of PCI and stenting of the obtuse marginal branch of the left circumflex coronary artery, left anterior descending coronary artery, and the right coronary artery, all of which were performed at [facility]. Approximately 4 years ago the patient moved to [facility] where he has been followed by Dr. Antoine Poche.Catheterization in May 2016 revealed patent stents in the left anterior descending coronary artery with 70% in-stent stenosis, and patent stents in both the left circumflex and the right coronary distributions. He was treated medically. He has known history of heart murmur and previous echocardiograms have demonstrated the presence of mild aortic stenosis with normal left ventricular systolic function. Follow-up echocardiogram performed July 18, 2017 revealed significant progression of disease with moderate to severe aortic stenosis. Peak velocity across the aortic valve range from 3.2-4.0 m/s corresponding to mean transvalvular gradient estimated 38 mmHg. The DVI was reported 0.19. Medical therapy was recommended.   The patient states that he has developed progressive symptoms of fatigue and over the past month classical symptoms of angina pectoris. He states that initially in the morning he feels well and he usually goes to the [facility] to exercise on a regular basis. By the end of the afternoon he gets fatigued easily and developed substernal chest tightness. Symptoms have gotten much worse over the past month. Following the recent snowstorm he was carrying a pile of wood to his house and developed severe substernal chest pain for which she took nitroglycerin. The chest pain finally resolved. Since then he has had  more frequent episodes of chest  pain and chest tightness with moderate and low-level activity. He was seen in the office on September 26, 2017 by Alyson Reedy scheduled for elective outpatient catheterization on October 02, 2017. Catheterization revealed severe multivessel coronary artery disease including 80% ostial stenosis of the left anterior descending coronary artery, 80% stenosis of the mid left anterior descending coronary artery, ostial left circumflex stenosis, and 95% in-stent restenosis in the mid right coronary artery. The patient was hospitalized and cardiothoracic surgical consultation requested. He has developed a pseudoaneurysm of the right femoral artery following catheterization and was evaluated by Dr. Nelda Severe today. Injection of the pseudoaneurysm has been planned for tomorrow.  The patient is married and lives locally in [place] with his wife. He has been disabled for approximately 10 years secondary to his numerous medical problems. He has remained functionally independent and reasonably active until recently. He describes a long history of exertional chest tightness but symptoms have progressed dramatically over the past month. He gets short of breath with exertion. He denies resting shortness of breath, PND, orthopnea, or lower extremity edema. He reports occasional dizzy spells without history of syncope. His mobility is slightly limited secondary to degenerative arthritis in his right knee.  Patient is a 67 year old male with history of aortic stenosis, coronary artery disease with multiple previous PCI and stents, hypertension, lupus, remote history of DVT with reported possible hypercoagulable state, GE reflux disease, [name]'s esophagus, and hyperlipidemia who returns to the office today with tentative plans to proceed with elective aortic valve replacement and coronary artery bypass grafting tomorrow. The patient was originally seen in consultation while he was hospitalized following  diagnostic cardiac catheterization on October 03, 2017. 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 He recovered uneventfully and was seen in follow-up recently by Dr. Doretha Imus VVSbecause of increased pain surrounding his right knee. Follow-up duplex revealed thrombosed pseudoaneurysm and no DVT,and his pain was felt to be related to chronic orthopedic problems related to his right knee. He returns to our office today and reports that he is looking forward to putting his cardiac surgery behind him. He states that since hospital discharge she had one brief episode of chest discomfort for which he took sublingual nitroglycerin. He has otherwise not had any other episodes of chest pain. He denies any shortness of breath although he admits that he has not been very active because of significantly increased pain involving his right knee. The remainder of his review of systems is unchanged from previously.  The patient presented this hospitalization electively to proceed with aortic valve replacement and coronary artery bypass grafting.   Discharged Condition: good  Hospital Course: The patient was admitted electively and taken to the operating room on 10/17/2017 at which time he underwent the below described procedure.  He tolerated it well and was taken to the surgical intensive care unit in stable condition.  Postoperative hospital course:  The patient is overall progressed quite well.  He had stable hemodynamics and required no inotropic support early on.  EKG initially did show a first-degree AV block with a new left bundle branch block.  He was extubated without difficulty following standard protocols.  He does have an expected acute blood loss anemia most recent hemoglobin and hematocrit are 8.1/25.7. He has been restarted on Coumadin and most recent INR is 2.00.   He did develop postoperative  atrial fibrillation with controlled ventricular response but his maintaining sinus rhythm currently.  He has a history of possible hypercoagulable state with previous DVT in the remote past on chronic warfarin anticoagulation preoperatively.  All routine lines, monitors and drainage devices have been discontinued in a standard fashion.  He has some postoperative volume overload but is responding well to diuretics.  Physical therapy is assisting with mobility.  He did have some postoperative nausea which responded to Zofran and Reglan.  Blood sugars are under reasonable control.  Preoperative A1c is 6.1.  He will benefit from long-term lifestyle and nutrition management for insulin resistance in the setting of severe coronary artery disease.  He has been started on aggressive lipid management.  Incisions are noted to be healing well without evidence of infection.  He is tolerating routine activities commensurate for level of postoperative convalescence using standard protocols.  Oxygen has been weaned and he maintains good saturations on room air.  Consults: None  Significant Diagnostic Studies: routine post op labs/serial CXR's, intra-op TEE  Treatments: surgery:  CARDIOTHORACIC SURGERY OPERATIVE NOTE  Date of Procedure:                10/17/2017  Preoperative Diagnosis:       ? Severe Aortic Stenosis ? Severe 3-vessel Coronary Artery Disease ? Unstable Angina Pectoris  Postoperative Diagnosis:    Same  Procedure:        Aortic Valve Replacement             Edwards Intuity Elite rapid-deployment Stented Bovine Pericardial Tissue Valve (size 25 mm, model # 8300AB, serial # [id number])   Coronary Artery Bypass Grafting x 4 with Coronary Endarterectomy x1             Left Internal Mammary Artery to Distal Left Anterior Descending Coronary Artery with Coronary Endarterectomy             Saphenous Vein Graft to Distal Right Coronary Artery             Saphenous  Vein Graft to First Diagonal Branch Coronary Artery             Saphenous Vein Graft to Obtuse Marginal Branch of Left Circumflex Coronary Artery             Endoscopic Vein Harvest from Bilateral Thighs and Lower Legs  Surgeon:        Salvatore Decentlarence H. Cornelius Moraswen, MD  Assistant:       Jari Favreessa Conte, PA-C  Anesthesia:    Lewie LoronJohn Germeroth, MD  Operative Findings: ? Severe aortic stenosis ? Normal left ventricular systolic function ? Mild mitral regurgitation ? Moderate tricuspid regurgitation ? Good quality left internal mammary artery conduit ? Good quality saphenous vein conduit ? Poor quality target vessels for grafting ? Diffuse partially calcified adhesions throughout the mediastinum obliterating the pericardial space ? Severe atherosclerotic plaque in ascending thoracic aorta  Disposition: 01-Home or Self Care   Discharge Medications:     Allergies as of 10/23/2017      Reactions   Sulfa Antibiotics Other (See Comments)   Makes patient jittery, prefers not to take them.       Medication List    STOP taking these medications   amLODipine 5 MG tablet Commonly known as:  NORVASC   chlorhexidine 4 % external liquid Commonly known as:  HIBICLENS   enoxaparin 120 MG/0.8ML injection Commonly known as:  LOVENOX   ibuprofen 200 MG tablet Commonly known as:  ADVIL,MOTRIN   isosorbide  mononitrate 120 MG 24 hr tablet Commonly known as:  IMDUR     TAKE these medications   acetaminophen 325 MG tablet Commonly known as:  TYLENOL Take 2 tablets (650 mg total) by mouth every 4 (four) hours as needed for headache or mild pain.   aspirin 81 MG chewable tablet Chew 1 tablet (81 mg total) by mouth daily.   atorvastatin 80 MG tablet Commonly known as:  LIPITOR Take 1 tablet (80 mg total) by mouth daily at 6 PM.   Azelastine HCl 0.15 % Soln USE 2 SPRAYS BY NASAL ROUTE AS NEEDED FOR CONGESTION   brimonidine 0.15 % ophthalmic solution Commonly known as:  ALPHAGAN Place 1 drop into  both eyes 2 (two) times daily.   calcium carbonate 500 MG chewable tablet Commonly known as:  TUMS - dosed in mg elemental calcium Chew 0.5 tablets by mouth as needed for indigestion or heartburn.   FISH OIL PO Take 2 capsules by mouth daily.   Flax Seed Oil 1000 MG Caps Take 1,000 mg by mouth 2 (two) times daily.   furosemide 40 MG tablet Commonly known as:  LASIX Take 1 tablet (40 mg total) by mouth daily. For 7 days   GOLD BOND ULTIMATE HEALING Crea Apply 1 application topically daily as needed (wound care).   loperamide 2 MG tablet Commonly known as:  IMODIUM A-D Take 2-4 mg by mouth 2 (two) times daily as needed for diarrhea or loose stools.   metoprolol tartrate 25 MG tablet Commonly known as:  LOPRESSOR TAKE 0.5 TABLETS BY MOUTH 2 TIMES DAILY   NASONEX 50 MCG/ACT nasal spray Generic drug:  mometasone Place 2 sprays into the nose daily as needed (allergies).   nitroGLYCERIN 0.4 MG SL tablet Commonly known as:  NITROSTAT Place 1 tablet (0.4 mg total) under the tongue every 5 (five) minutes as needed for chest pain.   pantoprazole 40 MG tablet Commonly known as:  PROTONIX Take 1 tablet (40 mg total) by mouth daily.   potassium chloride SA 20 MEQ tablet Commonly known as:  K-DUR,KLOR-CON Take 1 tablet (20 mEq total) by mouth daily.   Probiotic Caps Take 1 capsule by mouth daily.   ramipril 5 MG capsule Commonly known as:  ALTACE TAKE ONE CAPSULE BY MOUTH DAILY   timolol 0.5 % ophthalmic solution Commonly known as:  BETIMOL Place 1 drop into both eyes 2 (two) times daily.   traMADol 50 MG tablet Commonly known as:  ULTRAM Take 1 tablet (50 mg total) by mouth every 4 (four) hours as needed for moderate pain.   Turmeric Curcumin 500 MG Caps Take 500 mg by mouth 2 (two) times daily.   warfarin 4 MG tablet Commonly known as:  COUMADIN Take as directed. If you are unsure how to take this medication, talk to your nurse or doctor. Original instructions:   Take 1 tablet (4 mg total) by mouth daily at 6 PM.      Follow-up Information    Purcell Nails, MD Follow up.   Specialty:  Cardiothoracic Surgery Why:  office will mail your appointment Contact information: [address] [place] [address] [phone]        Darrol Jump, PA-C Follow up on 11/11/2017.   Specialties:  Cardiology, Radiology Why:  Appointment is at 11:00 Contact information: [address] [place] [address] [phone]        [facility] Office Follow up on 10/28/2017.  Specialty:  Cardiology Why:  Appointment is at 10:00 Contact information: [address] [place] [address] [phone]         The patient has been discharged on:   1.Beta Blocker:  Yes [ y  ]                              No   [   ]                              If No, reason:  2.Ace Inhibitor/ARB: Yes [ y  ]                                     No  [    ]                                     If No, reason:  3.Statin:   Yes [ y  ]                  No  [   ]                  If No, reason:  4.Ecasa:  Yes  Cove.Etienne   ]                  No   [   ]                  If No, reason:  Signed: Iretha Kirley 10/23/2017, 8:14 AM

## 2017-10-22 NOTE — Progress Notes (Addendum)
[facility] DAILY ICU PROGRESS NOTE                   [address]            [place] [address]          [phone]   5 Days Post-Op Procedure(s) (LRB): AORTIC VALVE REPLACEMENT (AVR),CORONARY ARTERY BY-PASS GRAFTING,TEE (N/A) CORONARY ARTERY BYPASS GRAFTING (CABG) time four using right and left saphaneous vien, harvested endoscopicly. and Left internal mammary artery. Coronary endartorectomy times one. (N/A) TRANSESOPHAGEAL ECHOCARDIOGRAM (TEE) (N/A)  Total Length of Stay:  LOS: 5 days   Subjective:  Doing okay.  States he has been having loose stools which have been occurring quite frequently.  Denies N/V  Objective: Vital signs in last 24 hours: Temp:  [97.5 F (36.4 C)-98.6 F (37 C)] 98.2 F (36.8 C) (01/08 0530) Pulse Rate:  [72-86] 82 (01/08 0530) Cardiac Rhythm: Normal sinus rhythm;Heart block;Bundle branch block (01/08 0530) Resp:  [15-24] 19 (01/08 0600) BP: (99-144)/(64-98) 126/86 (01/08 0530) SpO2:  [95 %-100 %] 96 % (01/08 0530) Weight:  [167 lb (75.8 kg)] 167 lb (75.8 kg) (01/08 0300)  Filed Weights   10/20/17 0600 10/21/17 0400 10/22/17 0300  Weight: 166 lb (75.3 kg) 166 lb 0.1 oz (75.3 kg) 167 lb (75.8 kg)    Weight change: 15.9 oz (0.451 kg)    Intake/Output from previous day: 01/07 0701 - 01/08 0700 In: 1060 [P.O.:960; IV Piggyback:100] Out: 1125 [Urine:1125]  Intake/Output this shift: No intake/output data recorded.  Current Meds: Scheduled Meds: . acetaminophen (TYLENOL) oral liquid 160 mg/5 mL  1,000 mg Oral Q6H  . aspirin  81 mg Oral Daily  . atorvastatin  80 mg Oral q1800  . brimonidine  1 drop Both Eyes BID  . enoxaparin (LOVENOX) injection  30 mg Subcutaneous QHS  . folic acid-pyridoxine-cyancobalamin  1 tablet Oral Daily  . furosemide  40 mg Oral Daily  . metoprolol tartrate  12.5 mg Oral BID  . mupirocin ointment  1 application Nasal BID  . pantoprazole  40 mg Oral Daily  . potassium chloride  20 mEq Oral Daily  .  ramipril  5 mg Oral Daily  . sodium chloride flush  3 mL Intravenous Q12H  . timolol  1 drop Both Eyes BID  . warfarin  4 mg Oral q1800  . Warfarin - Physician Dosing Inpatient   Does not apply q1800   Continuous Infusions: . sodium chloride    . lactated ringers     PRN Meds:.calcium carbonate, metoprolol tartrate, ondansetron (ZOFRAN) IV, oxyCODONE, sodium chloride flush, traMADol  General appearance: alert, cooperative and no distress Heart: regular rate and rhythm Lungs: diminished breath sounds bibasilar Abdomen: soft, non-tender; bowel sounds normal; no masses,  no organomegaly Extremities: edema 1+ pitting R >L Wound: clean and dry  Lab Results: CBC: Recent Labs    10/21/17 0330 10/22/17 0258  WBC 7.2 8.0  HGB 8.5* 8.5*  HCT 26.2* 27.0*  PLT 180 210   BMET:  Recent Labs    10/21/17 0330 10/22/17 0258  NA 136 135  K 3.6 4.3  CL 102 100*  CO2 28 27  GLUCOSE 107* 113*  BUN 22* 17  CREATININE 0.62 0.60*  CALCIUM 7.6* 8.0*    CMET: Lab Results  Component Value Date   WBC 8.0 10/22/2017   HGB 8.5 (L) 10/22/2017   HCT 27.0 (L) 10/22/2017   PLT 210 10/22/2017   GLUCOSE 113 (H) 10/22/2017  CHOL 76 10/04/2017   TRIG 77 10/04/2017   HDL 21 (L) 10/04/2017   LDLCALC 40 10/04/2017   ALT 24 10/16/2017   AST 24 10/16/2017   NA 135 10/22/2017   K 4.3 10/22/2017   CL 100 (L) 10/22/2017   CREATININE 0.60 (L) 10/22/2017   BUN 17 10/22/2017   CO2 27 10/22/2017   TSH 3.05 10/26/2016   PSA 0.07 (L) 10/26/2016   INR 2.04 10/22/2017   HGBA1C 6.1 (H) 10/16/2017      PT/INR:  Recent Labs    10/22/17 0258  LABPROT 22.9*  INR 2.04   Radiology: No results found.   Assessment/Plan: S/P Procedure(s) (LRB): AORTIC VALVE REPLACEMENT (AVR),CORONARY ARTERY BY-PASS GRAFTING,TEE (N/A) CORONARY ARTERY BYPASS GRAFTING (CABG) time four using right and left saphaneous vien, harvested endoscopicly. and Left internal mammary artery. Coronary endartorectomy times one.  (N/A) TRANSESOPHAGEAL ECHOCARDIOGRAM (TEE) (N/A)  1. Previous A. Fib, has been in NSR since yesterday evening with BBB and some PVCs- tolerating Lopressor and Altace 2. INR 2.04, will continue home regimen and repeat 4 mg tonight 3. Pulm- no acute issues, continue IS 4. Renal- creatinine remains WNL, weight is up 1lbs, I think he appears more edematous this morning.. On Lasix 40 mg daily.. Will discuss possibly increasing dose vs giving IV with Dr. Cornelius Moraswen 5. Hypokalemia- improved, K at 4.6 continue 20 meQ daily 6. GI- diarrhea, stop stool softners 7. Dispo- patient stable, maintaining NSR with occasional PVCs, continue Lopressor, Altace... Continue coumadin per home regimen, INR is therapeutic at 2.04, edema a little worse today will discuss diuretic regimen with Dr. Cornelius Moraswen, awaiting transfer to step down.. But may be ready for d/c in 1-2 days   Lowella Dandyrin Barrett 10/22/2017 7:36 AM    I have seen and examined the patient and agree with the assessment and plan as outlined.  Purcell Nailslarence H Owen, MD 10/22/2017 3:18 PM

## 2017-10-22 NOTE — Discharge Instructions (Signed)
Aortic Valve Replacement, Care After °Refer to this sheet in the next few weeks. These instructions provide you with information about caring for yourself after your procedure. Your health care provider may also give you more specific instructions. Your treatment has been planned according to current medical practices, but problems sometimes occur. Call your health care provider if you have any problems or questions after your procedure. °What can I expect after the procedure? °After the procedure, it is common to have: °· Pain around your incision area. °· A small amount of blood or clear fluid coming from your incision. ° °Follow these instructions at home: °Eating and drinking ° °· Follow instructions from your health care provider about eating or drinking restrictions. °? Limit alcohol intake to no more than 1 drink per day for nonpregnant women and 2 drinks per day for men. One drink equals 12 oz of beer, 5 oz of wine, or 1½ oz of hard liquor. °? Limit how much caffeine you drink. Caffeine can affect your heart's rate and rhythm. °· Drink enough fluid to keep your urine clear or pale yellow. °· Eat a heart-healthy diet. This should include plenty of fresh fruits and vegetables. If you eat meat, it should be lean cuts. Avoid foods that are: °? High in salt, saturated fat, or sugar. °? Canned or highly processed. °? Fried. °Activity °· Return to your normal activities as told by your health care provider. Ask your health care provider what activities are safe for you. °· Exercise regularly once you have recovered, as told by your health care provider. °· Avoid sitting for more than 2 hours at a time without moving. Get up and move around at least once every 1-2 hours. This helps to prevent blood clots in the legs. °· Do not lift anything that is heavier than 10 lb (4.5 kg) until your health care provider approves. °· Avoid pushing or pulling things with your arms until your health care provider approves. This  includes pulling on handrails to help you climb stairs. °Incision care ° °· Follow instructions from your health care provider about how to take care of your incision. Make sure you: °? Wash your hands with soap and water before you change your bandage (dressing). If soap and water are not available, use hand sanitizer. °? Change your dressing as told by your health care provider. °? Leave stitches (sutures), skin glue, or adhesive strips in place. These skin closures may need to stay in place for 2 weeks or longer. If adhesive strip edges start to loosen and curl up, you may trim the loose edges. Do not remove adhesive strips completely unless your health care provider tells you to do that. °· Check your incision area every day for signs of infection. Check for: °? More redness, swelling, or pain. °? More fluid or blood. °? Warmth. °? Pus or a bad smell. °Medicines °· Take over-the-counter and prescription medicines only as told by your health care provider. °· If you were prescribed an antibiotic medicine, take it as told by your health care provider. Do not stop taking the antibiotic even if you start to feel better. °Travel °· Avoid airplane travel for as long as told by your health care provider. °· When you travel, bring a list of your medicines and a record of your medical history with you. Carry your medicines with you. °Driving °· Ask your health care provider when it is safe for you to drive. Do not drive until your health   care provider approves.  Do not drive or operate heavy machinery while taking prescription pain medicine. Lifestyle   Do not use any tobacco products, such as cigarettes, chewing tobacco, or e-cigarettes. If you need help quitting, ask your health care provider.  Resume sexual activity as told by your health care provider. Do not use medicines for erectile dysfunction unless your health care provider approves, if this applies.  Work with your health care provider to keep your  blood pressure and cholesterol under control, and to manage any other heart conditions that you have.  Maintain a healthy weight. General instructions  Do not take baths, swim, or use a hot tub until your health care provider approves.  Do not strain to have a bowel movement.  Avoid crossing your legs while sitting down.  Check your temperature every day for a fever. A fever may be a sign of infection.  If you are a woman and you plan to become pregnant, talk with your health care provider before you become pregnant.  Wear compression stockings if your health care provider instructs you to do this. These stockings help to prevent blood clots and reduce swelling in your legs.  Tell all health care providers who care for you that you have an artificial (prosthetic) aortic valve. If you have or have had heart disease or endocarditis, tell all health care providers about these conditions as well.  Keep all follow-up visits as told by your health care provider. This is important. Contact a health care provider if:  You develop a skin rash.  You experience sudden, unexplained changes in your weight.  You have more redness, swelling, or pain around your incision.  You have more fluid or blood coming from your incision.  Your incision feels warm to the touch.  You have pus or a bad smell coming from your incision.  You have a fever. Get help right away if:  You develop chest pain that is different from the pain coming from your incision.  You develop shortness of breath or difficulty breathing.  You start to feel light-headed. These symptoms may represent a serious problem that is an emergency. Do not wait to see if the symptoms will go away. Get medical help right away. Call your local emergency services (911 in the [place] Do not drive yourself to the hospital. This information is not intended to replace advice given to you by your health care provider. Make sure you discuss any  questions you have with your health care provider. Document Released: 04/19/2005 Document Revised: 03/08/2016 Document Reviewed: 09/04/2015 [employer] Interactive Patient Education  2017 [employer]  What You Need to Know About Warfarin Warfarin is a blood thinner (anticoagulant). Anticoagulants help to prevent the formation of blood clots. They also help to stop the growth of blood clots. Who should use warfarin? Warfarin is prescribed for people who are at risk for developing harmful blood clots, such as people who have:  Surgically implanted mechanical heart valves.  Irregular heart rhythms (atrial fibrillation).  Certain clotting disorders.  A history of harmful blood clotting in the past. This includes people who have had: ? A stroke. ? Blood clot in the lungs (pulmonary embolism, or PE). ? Blood clot in the legs (deep vein thrombosis, or DVT).  An existing blood clot.  How is warfarin taken?  Warfarin is a medicine that you take by mouth (orally). Warfarin tablets come in different strengths. Each tablet strength is a different color, with the amount of warfarin  printed on the tablet. If you get a new prescription filled and the color of your tablet is different than usual, tell your pharmacist or health care provider immediately. What blood tests do I need while taking warfarin? The goal of warfarin therapy is to lessen the clotting tendency of blood, but not to prevent clotting completely. Your health care provider will monitor the anticoagulation effect of warfarin closely and will adjust your dose as needed. Warfarin is a medicine that needs to be closely monitored, so it is very important to keep all lab visits and follow-up visits with your health care provider. While taking warfarin, you will need to have blood tests (prothrombin tests, or PT tests) regularly to measure your blood clotting time. This type of test can be done with a finger stick or a blood draw. What does the  INR test result mean? The PT test results will be reported as the International Normalized Ratio (INR). The INR tells your health care provider whether your dosage of warfarin needs to be changed. The longer it takes your blood to clot, the higher the INR. Your health care provider will tell you your target INR range. If your INR is not in your target range, your health care provider may adjust your dosage.  If your INR is above your target range, there is a risk of bleeding. Your dosage of warfarin may need to be decreased.  If your INR is below your target range, there is a risk of clotting. Your dosage of warfarin may need to be increased.  How often is the INR test needed?  When you first start warfarin, you will usually have your INR checked every few days.  You may need to have INR tests done more than once a week until you are taking the correct dosage of warfarin.  After you have reached your target INR, your INR will be tested less often. However, you will need to have your INR checked at least once every 4-6 weeks for the entire time you are taking warfarin. What are the side effects of warfarin? Too much warfarin can cause bleeding (hemorrhage) in any part of the body, such as:  Bleeding from the gums.  Unexplained bruises.  Bruises that get larger.  Blood in the urine.  Bloody or dark stools.  Bleeding in the brain (hemorrhagic stroke).  A nosebleed that is not easily stopped.  Coughing up blood.  Vomiting blood.  Warfarin use may also cause:  Skin rash or irritations  Nausea that does not go away.  Severe pain in the back or joints.  Painful toes that turn blue or purple (purple toe syndrome).  Painful ulcers that do not go away (skin necrosis).  What are the signs and symptoms of a blood clot? Too little warfarin can increase the risk of blood clots in your legs, lungs, or arms. Signs and symptoms of a DVT in your leg or arm may include:  Pain or  swelling in your leg or arm.  Skin that is red or warm to the touch on your arm or leg.  Signs and symptoms of a pulmonary embolism may include:  Shortness of breath or difficulty breathing.  Chest pain.  Unexplained fever.  What are the signs and symptoms of a stroke? If you are taking too much or too little warfarin, you can have a stroke. Signs and symptoms of a stroke may include:  Weakness or numbness of your face, arm, or leg, especially on one side  of your body.  Confusion or trouble thinking clearly.  Difficulty seeing with one or both eyes.  Difficulty walking or moving your arms or legs.  Dizziness.  Loss of balance or coordination.  Trouble speaking, trouble understanding speech, or both (aphasia).  Sudden, severe headache with no known cause.  Partial or total loss of consciousness.  What precautions do I need to take while using warfarin?   Take warfarin exactly as told by your health care provider. Doing this helps you avoid bleeding or blood clots that could result in serious injury, pain, or disability.  Take your medicine at the same time every day. If you forget to take your dose of warfarin, take it as soon as you remember that day. If you do not remember on that day, do not take an extra dose the next day.  Contact your health care provider if you miss or take an extra dose. Do not change your dosage on your own to make up for missed or extra doses.  Wear or carry identification that says that you are taking warfarin.  Make sure that all health care providers, including your dentist, know you are taking warfarin.  If you need surgery, talk with your health care provider about whether you should stop taking warfarin before your surgery.  Avoid situations that cause bleeding. You may bleed more easily while taking warfarin. To limit bleeding, take the following actions: ? Use a softer toothbrush. ? Floss with waxed floss, not unwaxed floss. ? Shave  with an electric razor, not with a blade. ? Limit your use of sharp objects. ? Avoid potentially harmful activities, such as contact sports. What do I need to know about warfarin and pregnancy or breastfeeding?  Warfarin is not recommended during the first trimester of pregnancy due to an increased risk of birth defects. In certain situations, a woman may take warfarin after her first trimester of pregnancy.  If you are taking warfarin and you become pregnant or plan to become pregnant, contact your health care provider right away.  If you plan to breastfeed while taking warfarin, talk with your health care provider first. What do I need to know about warfarin and alcohol or drug use?  Avoid drinking alcohol, or limit alcohol intake to no more than 1 drink a day for nonpregnant women and 2 drinks a day for men. One drink equals 12 oz of beer, 5 oz of wine, or 1 oz of hard liquor. ? If you change the amount of alcohol that you drink, tell your health care provider. Your warfarin dosage may need to be changed.  Avoid tobacco products, such as cigarettes, chewing tobacco, and e-cigarettes. If you need help quitting, ask your health care provider. ? If you change the amount of nicotine or tobacco that you use, tell your health care provider. Your warfarin dosage may need to be changed.  Avoid street drugs while taking warfarin. The effects of street drugs on warfarin are not known. What do I need to know about warfarin and other medicines or supplements?  Many prescription and over-the-counter medicines can interfere with warfarin. Talk with your health care provider or your pharmacist before starting or stopping any new medicines. This includes over-the-counter vitamins, dietary supplements, herbal medicines, and pain medicines. Your warfarin dosage may need to be adjusted.  Some common over-the-counter medicines that may increase the risk of bleeding while taking warfarin  include: ? Acetaminophen. ? Aspirin ? NSAIDs, such as ibuprofen or naproxen. ? Vitamin E.  What do I need to know about warfarin and my diet?  It is important to maintain a normal, balanced diet while taking warfarin. Avoid major changes in your diet. If you are going to change your diet, talk with your health care provider before making changes.  Your health care provider may recommend that you work with a diet and nutrition specialist (dietitian).  Vitamin K decreases the effect of warfarin, and it is found in many foods. Eat a consistent amount of foods that contain vitamin K. For example, you may decide to eat 2 vitamin K-containing foods each day. Most foods that are high in vitamin K are green and leafy. Common foods that contain high amounts of vitamin K include:  Kale, raw or cooked.  Spinach, raw or cooked.  Collards, raw or cooked.  Swiss chard, raw or cooked.  Mustard greens, raw or cooked.  Turnip greens, raw or cooked.  Parsley, raw.  Broccoli, cooked.  Noodles, eggs, and spinach, enriched.  Brussels sprouts, raw or cooked.  Beet greens, raw or cooked.  Endive, raw.  Cabbage, cooked.  Asparagus, cooked.  Foods that contain moderate amounts of vitamin K include:  Broccoli, raw.  Cabbage, raw.  Bok choy, cooked.  Green leaf lettuce, raw  Prunes, stewed.  Rosita Fire.  Kiwi.  Edamame, cooked.  Romaine lettuce, raw.  Avocado.  Tuna, canned in oil.  Okra, cooked.  Black-eyed peas, cooked.  Green beans, cooked or raw.  Blueberries, raw.  Blackberries, raw.  Peas, cooked or raw.  Contact a health care provider if:  You miss a dose.  You take an extra dose.  You plan to have any kind of surgery or procedure.  You are unable to take your medicine due to nausea, vomiting, or diarrhea.  You have any major changes in your diet or you plan to make any major changes in your diet.  You start or stop any over-the-counter medicine,  prescription medicine, or dietary supplement.  You become pregnant, plan to become pregnant, or think you may be pregnant.  You have menstrual periods that are heavier than usual.  You have unusual bruising. Get help right away if:  You develop symptoms of an allergic reaction, such as: ? Swelling of the lips, face, tongue, mouth, or throat. ? Rash. ? Itching. ? Itchy, red, swollen areas of skin (hives). ? Trouble breathing. ? Chest tightness.  You have: ? Signs or symptoms of a stroke. ? Signs or symptoms of a blood clot. ? A fall or have an accident, especially if you hit your head. ? Blood in your urine. Your urine may look reddish, pinkish, or tea-colored. ? Blood in your stool. Your stool may be black or bright red. ? Bleeding that does not stop after applying pressure to the area for 30 minutes. ? Severe pain in your joints or back. ? Purple or blue toes. ? Skin ulcers that do not go away.  You vomit blood or cough up blood. The blood may be bright red, or it may look like coffee grounds. These symptoms may represent a serious problem that is an emergency. Do not wait to see if the symptoms will go away. Get medical help right away. Call your local emergency services (911 in the [place] Do not drive yourself to the hospital. Summary  Warfarin needs to be closely monitored with blood tests. It is very important to keep all lab visits and follow-up visits with your health care provider.  Make sure that you know  your target INR range and your warfarin dosage.  Wear or carry identification that says that you are taking warfarin.  Take warfarin at the same time every day. Call your health care provider if you miss a dose or if you take an extra dose. Do not change the dosage of warfarin on your own.  Know the signs and symptoms of blood clots, bleeding, and a stroke. Know when to get emergency medical help.  Tell all health care providers who care for you that you are taking  warfarin.  Talk with your health care provider or your pharmacist before starting or stopping any new medicines.  Monitor how much vitamin K you eat every day. Try to eat the same amount every day. This information is not intended to replace advice given to you by your health care provider. Make sure you discuss any questions you have with your health care provider. Document Released: 10/01/2005 Document Revised: 06/12/2016 Document Reviewed: 12/28/2015 [employer] Interactive Patient Education  2017 [employer]  Endoscopic Saphenous Vein Harvesting, Care After Refer to this sheet in the next few weeks. These instructions provide you with information about caring for yourself after your procedure. Your health care provider may also give you more specific instructions. Your treatment has been planned according to current medical practices, but problems sometimes occur. Call your health care provider if you have any problems or questions after your procedure. What can I expect after the procedure? After the procedure, it is common to have:  Pain.  Bruising.  Swelling.  Numbness.  Follow these instructions at home: Medicine  Take over-the-counter and prescription medicines only as told by your health care provider.  Do not drive or operate heavy machinery while taking prescription pain medicine. Incision care   Follow instructions from your health care provider about how to take care of the cut made during surgery (incision). Make sure you: ? Wash your hands with soap and water before you change your bandage (dressing). If soap and water are not available, use hand sanitizer. ? Change your dressing as told by your health care provider. ? Leave stitches (sutures), skin glue, or adhesive strips in place. These skin closures may need to be in place for 2 weeks or longer. If adhesive strip edges start to loosen and curl up, you may trim the loose edges. Do not remove adhesive strips  completely unless your health care provider tells you to do that.  Check your incision area every day for signs of infection. Check for: ? More redness, swelling, or pain. ? More fluid or blood. ? Warmth. ? Pus or a bad smell. General instructions  Raise (elevate) your legs above the level of your heart while you are sitting or lying down.  Do any exercises your health care providers have given you. These may include deep breathing, coughing, and walking exercises.  Do not shower, take baths, swim, or use a hot tub unless told by your health care provider.  Wear your elastic stocking if told by your health care provider.  Keep all follow-up visits as told by your health care provider. This is important. Contact a health care provider if:  Medicine does not help your pain.  Your pain gets worse.  You have new leg bruises or your leg bruises get bigger.  You have a fever.  Your leg feels numb.  You have more redness, swelling, or pain around your incision.  You have more fluid or blood coming from your incision.  Your  incision feels warm to the touch.  You have pus or a bad smell coming from your incision. Get help right away if:  Your pain is severe.  You develop pain, tenderness, warmth, redness, or swelling in any part of your leg.  You have chest pain.  You have trouble breathing. This information is not intended to replace advice given to you by your health care provider. Make sure you discuss any questions you have with your health care provider. Document Released: 06/13/2011 Document Revised: 03/08/2016 Document Reviewed: 08/15/2015 [employer] Interactive Patient Education  2018 [employer]  Coronary Artery Bypass Grafting, Care After These instructions give you information on caring for yourself after your procedure. Your doctor may also give you more specific instructions. Call your doctor if you have any problems or questions after your procedure. Follow  these instructions at home:  Only take medicine as told by your doctor. Take medicines exactly as told. Do not stop taking medicines or start any new medicines without talking to your doctor first.  Take your pulse as told by your doctor.  Do deep breathing as told by your doctor. Use your breathing device (incentive spirometer), if given, to practice deep breathing several times a day. Support your chest with a pillow or your arms when you take deep breaths or cough.  Keep the area clean, dry, and protected where the surgery cuts (incisions) were made. Remove bandages (dressings) only as told by your doctor. If strips were applied to surgical area, do not take them off. They fall off on their own.  Check the surgery area daily for puffiness (swelling), redness, or leaking fluid.  If surgery cuts were made in your legs: ? Avoid crossing your legs. ? Avoid sitting for long periods of time. Change positions every 30 minutes. ? Raise your legs when you are sitting. Place them on pillows.  Wear stockings that help keep blood clots from forming in your legs (compression stockings).  Only take sponge baths until your doctor says it is okay to take showers. Pat the surgery area dry. Do not rub the surgery area with a washcloth or towel. Do not bathe, swim, or use a hot tub until your doctor says it is okay.  Eat foods that are high in fiber. These include raw fruits and vegetables, whole grains, beans, and nuts. Choose lean meats. Avoid canned, processed, and fried foods.  Drink enough fluids to keep your pee (urine) clear or pale yellow.  Weigh yourself every day.  Rest and limit activity as told by your doctor. You may be told to: ? Stop any activity if you have chest pain, shortness of breath, changes in heartbeat, or dizziness. Get help right away if this happens. ? Move around often for short amounts of time or take short walks as told by your doctor. Gradually become more active. You may  need help to strengthen your muscles and build endurance. ? Avoid lifting, pushing, or pulling anything heavier than 10 pounds (4.5 kg) for at least 6 weeks after surgery.  Do not drive until your doctor says it is okay.  Ask your doctor when you can go back to work.  Ask your doctor when you can begin sexual activity again.  Follow up with your doctor as told. Contact a doctor if:  You have puffiness, redness, more pain, or fluid draining from the incision site.  You have a fever.  You have puffiness in your ankles or legs.  You have pain in your  legs.  You gain 2 or more pounds (0.9 kg) a day.  You feel sick to your stomach (nauseous) or throw up (vomit).  You have watery poop (diarrhea). Get help right away if:  You have chest pain that goes to your jaw or arms.  You have shortness of breath.  You have a fast or irregular heartbeat.  You notice a "clicking" in your breastbone when you move.  You have numbness or weakness in your arms or legs.  You feel dizzy or light-headed. This information is not intended to replace advice given to you by your health care provider. Make sure you discuss any questions you have with your health care provider. Document Released: 10/06/2013 Document Revised: 03/08/2016 Document Reviewed: 03/10/2013 Elsevier Interactive Patient Education  2017 [employer].

## 2017-10-23 LAB — BASIC METABOLIC PANEL
Anion gap: 8 (ref 5–15)
BUN: 13 mg/dL (ref 6–20)
CO2: 24 mmol/L (ref 22–32)
CREATININE: 0.62 mg/dL (ref 0.61–1.24)
Calcium: 7.9 mg/dL — ABNORMAL LOW (ref 8.9–10.3)
Chloride: 102 mmol/L (ref 101–111)
GFR calc Af Amer: >60 mL/min (ref >60)
GFR calc non Af Amer: >60 mL/min (ref >60)
Glucose, Bld: 125 mg/dL — ABNORMAL HIGH (ref 65–99)
Potassium: 3.8 mmol/L (ref 3.5–5.1)
SODIUM: 134 mmol/L — AB (ref 135–145)

## 2017-10-23 LAB — CBC
HCT: 25.7 % — ABNORMAL LOW (ref 39.0–52.0)
Hemoglobin: 8.1 g/dL — ABNORMAL LOW (ref 13.0–17.0)
MCH: 26.6 pg (ref 26.0–34.0)
MCHC: 31.5 g/dL (ref 30.0–36.0)
MCV: 84.5 fL (ref 78.0–100.0)
PLATELETS: 232 10*3/uL (ref 150–400)
RBC: 3.04 MIL/uL — ABNORMAL LOW (ref 4.22–5.81)
RDW: 16.2 % — AB (ref 11.5–15.5)
WBC: 6.4 10*3/uL (ref 4.0–10.5)

## 2017-10-23 LAB — PROTIME-INR
INR: 2
Prothrombin Time: 22.5 seconds — ABNORMAL HIGH (ref 11.4–15.2)

## 2017-10-23 MED ORDER — POTASSIUM CHLORIDE CRYS ER 20 MEQ PO TBCR: 20.0000 meq | EXTENDED_RELEASE_TABLET | Freq: Every day | ORAL | 0 refills | Status: DC

## 2017-10-23 MED ORDER — FUROSEMIDE 40 MG PO TABS: 40.0000 mg | ORAL_TABLET | Freq: Every day | ORAL | 0 refills | Status: DC

## 2017-10-23 MED ORDER — CALCIUM CARBONATE ANTACID 500 MG PO CHEW: 1.0000 | CHEWABLE_TABLET | Freq: Every day | ORAL | Status: DC | PRN

## 2017-10-23 MED ORDER — TRAMADOL HCL 50 MG PO TABS: 50.0000 mg | ORAL_TABLET | ORAL | 0 refills | Status: DC | PRN

## 2017-10-23 MED ORDER — WARFARIN SODIUM 4 MG PO TABS: 4.0000 mg | ORAL_TABLET | Freq: Every day | ORAL | 3 refills | Status: AC

## 2017-10-23 NOTE — Progress Notes (Signed)
Pt has been walking independently with RW. No c/o. Ed completed with pt and wife. Good reception. Will send referral to [facility]. Pt is questioning his need for HHPT. Discussing with CM and PT.  [id number] [name] CES, ACSM 11:37 AM 10/23/2017

## 2017-10-23 NOTE — Progress Notes (Signed)
Pt D/C home per MD order, D/C instructions reviewed with pt and wife, all questions answered. Pt aware of follow up appt.,sutures and IV removed and site looks clean and intact, s Pt verbalized understanding of discharged instructions.

## 2017-10-23 NOTE — Progress Notes (Addendum)
      [address]       [place] [id number]             [phone]      Subjective:   No new complaints.  Diarrhea is improved, hoping to go home today  Objective:  Vital Signs in the last 24 hours: Temp:  [97.3 F (36.3 C)-99.7 F (37.6 C)] 98.2 F (36.8 C) (01/09 0422) Pulse Rate:  [77-114] 78 (01/09 0422) Resp:  [18-23] 18 (01/09 0422) BP: (114-137)/(70-93) 119/71 (01/09 0422) SpO2:  [95 %-100 %] 97 % (01/09 0422) Weight:  [165 lb 1.6 oz (74.9 kg)] 165 lb 1.6 oz (74.9 kg) (01/09 0422)  Intake/Output from previous day: 01/08 0701 - 01/09 0700 In: 480 [P.O.:480] Out: 200 [Urine:200]  Physical Exam: General appearance: alert, cooperative and no distress Lungs: diminished breath sounds bibasilar Heart: irregularly irregular rhythm Abdomen: soft, non-tender; bowel sounds normal; no masses,  no organomegaly Extremities: edema trace Incisions: clean and dry   Lab Results: Recent Labs    10/22/17 0258 10/23/17 0229  WBC 8.0 6.4  HGB 8.5* 8.1*  PLT 210 232   Assessment/Plan:   1. CV- Rate controlled A. Fib- continue Lopressor, Altace 2. INR 2.00, continue coumadin per home regimen, will plan to have PT/INR drawn on Friday 3. Pulm- no acute issues, continue IS 4. Renal- creatinine remains WNL, weight is down 2 lbs, continue diuretics 5. Expected blood loss anemia- stable, continue iron 6. Dispo- patient stable, will d/c home today if okay with Dr. Cornelius Moraswen   LOS: 6 days    Erin Barrett 10/23/2017, 8:00 AM   I have seen and examined the patient and agree with the assessment and plan as outlined.  D/C home today.  Purcell Nailslarence H Bell Carbo, MD 10/23/2017 8:46 AM

## 2017-10-23 NOTE — Consult Note (Signed)
   [facility] CM Inpatient Consult   10/23/2017  [name] Nov 06, 1950 [id number]   Patient screened for [facility] Care Management needs in the [employer] plan.  Met with the patient and his wife, [name],  at the bedside and they verified that they have [employer]. Patient was discharging to home, given a brochure, 24 hour magnet with contact information. No needs assessed.  Wife states they will have home health come out to evaluate their home and they plan to do Cardiac Rehab.  Patient's primary care provider is Dorothyann Peng, NP at [facility].  This office is listed to provide the transition of care calls and follow up.  Wife states no care management needs.  For questions, please contact:  Natividad Brood, RN BSN [facility] Liaison  [phone] business mobile phone Toll free office [phone]

## 2017-10-23 NOTE — Progress Notes (Signed)
Paged MD on call about patient reversing to Afib. The order is to keep monitoring as long as the heart rate remains in the 80's.

## 2017-10-23 NOTE — Care Management Important Message (Signed)
Important Message  Patient Details  Name: [name] MRN: [id number] Date of Birth: 02/15/1951   Medicare Important Message Given:  Yes    Taequan Stockhausen Abena 10/23/2017, 11:13 AM

## 2017-10-23 NOTE — Progress Notes (Signed)
Physical Therapy Treatment Patient Details Name: [name] MRN: [id number] DOB: [birth year] Today's Date: 10/23/2017    History of Present Illness Pt is a 67 y.o. male now s/p AVR and CABGx4 on 10/17/17. Pertinent PMH includes aortic stenosis, CAD with multiple previous PCI and stents, HTN, lupus, remote h/o DVT, Barrett's esophagus, HLD,  R knee sx.     PT Comments    Pt performed increased gait and functional mobility during session this am.  Plans for return home this pm with support from his wife and HHPT.  Pt reviewed sternal precautions and able to recall but remains to require cueing to maintain.  On arrival patient with stool on his R thigh and require assistance to clean himself up and put on his clothes to prepare for d/c home.  Pt reviewed stair training, bed mobility, transfer and gait training.  PTA answered all questions before leaving room.  Pt sitting in recliner await RN to d/c home.      Follow Up Recommendations  Home health PT;Supervision - Intermittent     Equipment Recommendations  None recommended by PT    Recommendations for Other Services       Precautions / Restrictions Precautions Precautions: Fall;Sternal Precaution Comments: pt able to verbalize sternal precautions Restrictions Weight Bearing Restrictions: Yes(sternal precautions. ) Other Position/Activity Restrictions: Sternal    Mobility  Bed Mobility Overal bed mobility: Needs Assistance Bed Mobility: Rolling;Sidelying to Sit;Sit to Sidelying Rolling: Supervision Sidelying to sit: Min assist     Sit to sidelying: Supervision General bed mobility comments: Pt placed in flat position to simulate home environment.  Pt performed with rolling technique holding to heart pillow,  Pt able to return to bed unassisted but remains to require min assist for trunk elevation during sidelying to sit.  Pt with increased time and WOB post practicing.    Transfers Overall transfer level: Needs  assistance Equipment used: Rolling walker (2 wheeled) Transfers: Sit to/from Stand Sit to Stand: Supervision         General transfer comment: Cues for hand placement on knees to maintain sternal precautions at time patient attempting to push despite being able to recall precuations when asked.    Ambulation/Gait Ambulation/Gait assistance: Min guard Ambulation Distance (Feet): 400 Feet Assistive device: Rolling walker (2 wheeled) Gait Pattern/deviations: Step-through pattern;Decreased stride length;Antalgic;Trunk flexed Gait velocity: Decreased   General Gait Details: Pt did not require a rest break and able to perform stair training and midway point of gait.  Pt remains to require cues for safety with RW and forward gaze.     Stairs Stairs: Yes   Stair Management: One rail Right;Forwards;Backwards;Step to pattern Number of Stairs: 4 General stair comments: Cues for sequencing and use of R rail while maintaining sternal precautions.  Pt performed with min guard in a step to pattern.   Forward to ascend and backward to descend.    Wheelchair Mobility    Modified Rankin (Stroke Patients Only)       Balance Overall balance assessment: Needs assistance   Sitting balance-Leahy Scale: Good       Standing balance-Leahy Scale: Fair                              Cognition Arousal/Alertness: Awake/alert Behavior During Therapy: WFL for tasks assessed/performed Overall Cognitive Status: Within Functional Limits for tasks assessed  Exercises      General Comments        Pertinent Vitals/Pain Pain Assessment: 0-10 Pain Score: 4  Pain Location: sternal incision Pain Descriptors / Indicators: Sore;Operative site guarding Pain Intervention(s): Monitored during session;Repositioned    Home Living                      Prior Function            PT Goals (current goals can now be found in  the care plan section) Acute Rehab PT Goals Patient Stated Goal: Return home; get knee fixed Potential to Achieve Goals: Good Progress towards PT goals: Progressing toward goals    Frequency    Min 3X/week      PT Plan Current plan remains appropriate    Co-evaluation              AM-PAC PT "6 Clicks" Daily Activity  Outcome Measure  Difficulty turning over in bed (including adjusting bedclothes, sheets and blankets)?: None Difficulty moving from lying on back to sitting on the side of the bed? : A Lot Difficulty sitting down on and standing up from a chair with arms (e.g., wheelchair, bedside commode, etc,.)?: A Little Help needed moving to and from a bed to chair (including a wheelchair)?: A Little Help needed walking in hospital room?: A Little Help needed climbing 3-5 steps with a railing? : A Little 6 Click Score: 18    End of Session Equipment Utilized During Treatment: Gait belt Activity Tolerance: Patient tolerated treatment well Patient left: with call bell/phone within reach;in chair;with family/visitor present Nurse Communication: Mobility status PT Visit Diagnosis: Other abnormalities of gait and mobility (R26.89) Pain - part of body: (sternum)     Time: [id number] PT Time Calculation (min) (ACUTE ONLY): 29 min  Charges:  $Gait Training: 8-22 mins $Therapeutic Activity: 8-22 mins                    G CodesJoycelyn Rua:       [name], PTA pager [phone]    Kenneth AversAimee J Akiya Morr 10/23/2017, 3:46 PM

## 2017-10-23 NOTE — Care Management Note (Signed)
Case Management Note Original Note Created Leone Havenaylor, Deborah Clinton, RN--10/21/2017, 12:45 PM   Patient Details  Name: [name] MRN: [id number] Date of Birth: 1951/05/11  Subjective/Objective:   From home with wife who will be able to assit him, he has a daughter who can help also, he has a cane and a walker at home, he states he has a bad knee.  He has a PCP and medication coverage. POD 4 CABG, AVR,  Per pt eval [facility] NCM informed patient that there is a 25.00 co pay with his insurance for each therapy session, he states ok. Will need HHPT orders at discharge with face to face. He will be HRI with [facility], discussed in LOS.                 Action/Plan: NCM will follow for dc needs.   Expected Discharge Date:  10/23/17               Expected Discharge Plan:  Home w Home Health Services  In-House Referral:     Discharge planning Services  CM Consult  Post Acute Care Choice:  Home Health Choice offered to:  Patient  DME Arranged:    DME Agency:     HH Arranged:  RN, PT HH Agency:  [facility]  Status of Service:  Completed, signed off  If discussed at Long Length of Stay Meetings, dates discussed:    Discharge Disposition: home/home health   Additional Comments:  10/23/17- 1130- Donn PieriniKristi Larua Collier RN, CM- pt for d/c home today with wife- orders have been placed for HHRN/PT- spoke with pt and wife at bedside- [facility] and [facility] - pt states he has a RW at home and daughter working on getting a Paediatric nurseshower chair . No other CM needs noted - notified Lupita LeashDonna with [facility] of discharge for today- [facility] will f[facility]u for [facility] and [facility].   Darrold SpanWebster, Antanasia Kaczynski Hall, RN 10/23/2017, 11:51 AM [phone]

## 2017-10-24 DIAGNOSIS — I739 Peripheral vascular disease, unspecified: Secondary | ICD-10-CM | POA: Diagnosis not present

## 2017-10-24 DIAGNOSIS — Z955 Presence of coronary angioplasty implant and graft: Secondary | ICD-10-CM | POA: Diagnosis not present

## 2017-10-24 DIAGNOSIS — Z48812 Encounter for surgical aftercare following surgery on the circulatory system: Secondary | ICD-10-CM | POA: Diagnosis not present

## 2017-10-24 DIAGNOSIS — Z952 Presence of prosthetic heart valve: Secondary | ICD-10-CM | POA: Diagnosis not present

## 2017-10-24 DIAGNOSIS — K219 Gastro-esophageal reflux disease without esophagitis: Secondary | ICD-10-CM | POA: Diagnosis not present

## 2017-10-24 DIAGNOSIS — Z7901 Long term (current) use of anticoagulants: Secondary | ICD-10-CM | POA: Diagnosis not present

## 2017-10-24 DIAGNOSIS — M329 Systemic lupus erythematosus, unspecified: Secondary | ICD-10-CM | POA: Diagnosis not present

## 2017-10-24 DIAGNOSIS — E785 Hyperlipidemia, unspecified: Secondary | ICD-10-CM | POA: Diagnosis not present

## 2017-10-24 DIAGNOSIS — I10 Essential (primary) hypertension: Secondary | ICD-10-CM | POA: Diagnosis not present

## 2017-10-24 DIAGNOSIS — K2270 Barrett's esophagus without dysplasia: Secondary | ICD-10-CM | POA: Diagnosis not present

## 2017-10-24 DIAGNOSIS — I350 Nonrheumatic aortic (valve) stenosis: Secondary | ICD-10-CM | POA: Diagnosis not present

## 2017-10-24 DIAGNOSIS — D6859 Other primary thrombophilia: Secondary | ICD-10-CM | POA: Diagnosis not present

## 2017-10-24 DIAGNOSIS — Z86718 Personal history of other venous thrombosis and embolism: Secondary | ICD-10-CM | POA: Diagnosis not present

## 2017-10-24 DIAGNOSIS — I77810 Thoracic aortic ectasia: Secondary | ICD-10-CM | POA: Diagnosis not present

## 2017-10-24 DIAGNOSIS — I25700 Atherosclerosis of coronary artery bypass graft(s), unspecified, with unstable angina pectoris: Secondary | ICD-10-CM | POA: Diagnosis not present

## 2017-10-25 ENCOUNTER — Telehealth: Payer: Self-pay | Admitting: Physician Assistant

## 2017-10-25 ENCOUNTER — Telehealth (HOSPITAL_COMMUNITY): Payer: Self-pay

## 2017-10-25 NOTE — Telephone Encounter (Signed)
Received a call back from Patty with [facility] she was calling to ask Dr.Hochrein if ok to enroll patient in the high risk initiative program ran by Dr.Aronson to help keep patients out of hospital.Advised I will send message to Dr.Hochrein.

## 2017-10-25 NOTE — Telephone Encounter (Signed)
Returned call to Foll[facility]beePatty [facility] no answer.LMTC.

## 2017-10-25 NOTE — Telephone Encounter (Signed)
Called and spoke with patient in regards to Cardiac Rehab to see if he is interested. Patient is interested. Explained our scheduling process. Will call after follow up appt.

## 2017-10-25 NOTE — Telephone Encounter (Signed)
Patients insurance is active and benefits verified through [place] - $10.00 co-pay, no deductible, out of pocket amount of $3,100/$0.00 has been met, no co-insurance, and no pre-authorization is required. Spoke with health team [employer] - reference [id number]  Patient will be contacted and scheduled after their follow up appt with the Cardiologist office upon review by the RN Navigator.

## 2017-10-25 NOTE — Telephone Encounter (Signed)
[name] ( [facility] ) is calling to get orders for Home health Care .  Please call

## 2017-10-27 NOTE — Telephone Encounter (Signed)
OK 

## 2017-10-28 ENCOUNTER — Ambulatory Visit: Payer: PPO

## 2017-10-28 ENCOUNTER — Ambulatory Visit (INDEPENDENT_AMBULATORY_CARE_PROVIDER_SITE_OTHER): Payer: PPO | Admitting: General Practice

## 2017-10-28 DIAGNOSIS — D6862 Lupus anticoagulant syndrome: Secondary | ICD-10-CM | POA: Diagnosis not present

## 2017-10-28 DIAGNOSIS — Z7901 Long term (current) use of anticoagulants: Secondary | ICD-10-CM

## 2017-10-28 LAB — POCT INR: INR: 4.1

## 2017-10-28 NOTE — Telephone Encounter (Signed)
[facility]rein advised ok to enroll patient in the initiative program.

## 2017-10-28 NOTE — Patient Instructions (Addendum)
Pre visit review using our clinic review tool, if applicable. No additional management support is needed unless otherwise documented below in the visit note.  Skip coumadin today and then continue to take 1 tablet daily except 1/2 tablet on Thursdays. Recheck in 2 weeks.

## 2017-10-30 DIAGNOSIS — I2510 Atherosclerotic heart disease of native coronary artery without angina pectoris: Secondary | ICD-10-CM | POA: Diagnosis not present

## 2017-10-31 ENCOUNTER — Telehealth: Payer: Self-pay | Admitting: Adult Health

## 2017-10-31 ENCOUNTER — Telehealth: Payer: Self-pay

## 2017-10-31 NOTE — Telephone Encounter (Signed)
Copied from CRM [id number]. Topic: General - Other >> Oct 31, 2017 10:12 AM Stephannie LiSimmons, Zarina Pe L, NT wrote: Reason for CRM: Pattie from [facility] acalled and needs a verbal for to draw PTI, INR ar home  so he does not have to go to the lab ,please call  [phone]

## 2017-10-31 NOTE — Telephone Encounter (Signed)
Ok for verbal order  °

## 2017-10-31 NOTE — Telephone Encounter (Signed)
PT with [facility] called this morning to notify the office of him having mild mid back pain which the patient has a history of back pain.  Stated that the pain was a 2-4/10 on the pain scale.  PT also stated that there is no shortness of breath, chest pain, or arm pain associated with this back pain.  Patient believes that it is musculoskeletal related due to sleeping in a recliner and using the pull lever to elevate his legs.  He also stated that the patient said he took a Tramadol and the pain subsided.  No other concerns at this time.

## 2017-11-01 ENCOUNTER — Telehealth: Payer: Self-pay | Admitting: Cardiology

## 2017-11-01 NOTE — Telephone Encounter (Signed)
New message    [name] from [facility] calling with concerns : Low temp 99.5 and 100.8 today Please call [name]   Pt c/o swelling: STAT is pt has developed SOB within 24 hours  1) How much weight have you gained and in what time span? 2lbs pounds in 7 days  2) If swelling, where is the swelling located? Right lower extremity +1  3) Are you currently taking a fluid pill? No  4) Are you currently SOB? NO  5) Do you have a log of your daily weights (if so, list)? 159.6, 156.8  6) Have you gained 3 pounds in a day or 5 pounds in a week? NO  7) Have you traveled recently? NO

## 2017-11-01 NOTE — Telephone Encounter (Signed)
Verbal orders called to Patty and left on voicemail.

## 2017-11-01 NOTE — Telephone Encounter (Signed)
Spoke with Morrie Sheldonashley, she reports [facility], dr Marius Ditcharenson has given the order for the patient to restart lasix and potassium. She states the patient took tylenol for the fever and it came down to 98.6. Advised if temp continues they will need to call the surgeon's office.

## 2017-11-04 ENCOUNTER — Ambulatory Visit: Payer: PPO | Admitting: Physician Assistant

## 2017-11-04 ENCOUNTER — Telehealth: Payer: Self-pay | Admitting: Physician Assistant

## 2017-11-04 DIAGNOSIS — I2510 Atherosclerotic heart disease of native coronary artery without angina pectoris: Secondary | ICD-10-CM | POA: Diagnosis not present

## 2017-11-04 NOTE — Telephone Encounter (Addendum)
[facility] Patient is post-CABG on Jan 3 (Dr. Cornelius Moraswen) He has been running a fever in the evenings - incision site and vein graft harvest sites look fine [facility] His weight has been fluctuating - stopped lasix on 1/15 1/16 156 lbs 1/17 156.8 lbs 1/18 159.6 lbs ** Dr. Jacky KindleAronson put him on lasix 20mg  daily + K+ 1/19 155.8 lbs 1/20 156 lbs 1/21 155 lbs  Patient denies shortness of breath and swelling since resuming low dose diuretic BMET was drawn today - advised nurse to fax to [phone] (attnFranky Macho: [place], [place]) as patient has appointment tomorrow Advised to continue diuretic dose as prescribed by Dr. Jacky KindleAronson until appt tomorrow  Deferred to DOD to review and advise if any further recommendations

## 2017-11-04 NOTE — Telephone Encounter (Signed)
I spoke with Alexia Freestoneatty, [facility] and gave orders to check INR on Thursday, 1/24.

## 2017-11-04 NOTE — Telephone Encounter (Signed)
[name] @ [facility] needs to know what day to take INR. Please call her.

## 2017-11-04 NOTE — Telephone Encounter (Signed)
Nothing further to add today. [facility].  Reatha Sur Swazil[place] MD, [facility]

## 2017-11-04 NOTE — Telephone Encounter (Signed)
Routed to [place], [place] in anticipation of 1/22 appointment

## 2017-11-05 ENCOUNTER — Telehealth: Payer: Self-pay | Admitting: *Deleted

## 2017-11-05 ENCOUNTER — Ambulatory Visit (INDEPENDENT_AMBULATORY_CARE_PROVIDER_SITE_OTHER): Payer: PPO | Admitting: Cardiology

## 2017-11-05 ENCOUNTER — Encounter: Payer: Self-pay | Admitting: Cardiology

## 2017-11-05 ENCOUNTER — Telehealth: Payer: Self-pay

## 2017-11-05 VITALS — HR 85 | Ht 69.0 in | Wt 160.0 lb

## 2017-11-05 DIAGNOSIS — R0602 Shortness of breath: Secondary | ICD-10-CM

## 2017-11-05 DIAGNOSIS — D6862 Lupus anticoagulant syndrome: Secondary | ICD-10-CM

## 2017-11-05 DIAGNOSIS — I2510 Atherosclerotic heart disease of native coronary artery without angina pectoris: Secondary | ICD-10-CM | POA: Diagnosis not present

## 2017-11-05 DIAGNOSIS — Z9861 Coronary angioplasty status: Secondary | ICD-10-CM | POA: Diagnosis not present

## 2017-11-05 DIAGNOSIS — Z953 Presence of xenogenic heart valve: Secondary | ICD-10-CM | POA: Diagnosis not present

## 2017-11-05 DIAGNOSIS — R509 Fever, unspecified: Secondary | ICD-10-CM | POA: Diagnosis not present

## 2017-11-05 NOTE — Assessment & Plan Note (Signed)
Chronic Coumadin Rx 

## 2017-11-05 NOTE — Progress Notes (Signed)
11/05/2017 [name]   Dec 03, 1950  [id number]  Primary Physician Nafziger, Kandee Keen, NP Primary Cardiologist: Dr Antoine Poche  HPI:  67 y/o male with a history of CAD, s/p multiple stents in 2014 at [facility]. He moved to [place] after this and has been followed here since. He was seen in the office 09/27/17 with complaints of accelerating exertional angina. He also has known moderate to severe AS and Lupus anticoagulant with a history of PE and DVT. He is on chronic Coumadin. His Coumadin was held and he was admitted for Rt and Lt heart cath 10/02/17. This revealed severe progression of CAD in all three major arteries and his OM (his previously placed pRCA, CFX x 2, OM1, and LAD stents were all patent). The pt was placed on IV Heparin post cath and seen in consult by Dr Cornelius Moras who recommended CABG and AVR. Unfortunately the pt developed a pseudoaneurysm post cath. He was seen on consult by Dr Randie Heinz and this was injected by Dr Darrick Penna on 10/03/17. F/U doppler 2 hrs post confirmed successful closure. He was placed on Lovenox and discharge with plans to readmit him Jan 3'd for surgery. On 10/17/17 he underwent CABG x 4 with an LIMA-LAD, SVG-RCA, SVG-Dx1, and an SVG-OM. He also had a pericardial tissue AVR placed. He was discharged 10/22/17. Since discharge his wife says he has had fevers with chills in the evenings with fevers up to 101. He also has generalized fatigue.  His wife has been concerned about LE edema and wgt but his wgt is not far off his pre op weight and less then his discharge wgt. His PCP had added Lasix 20 mg in the past few days.     Current Outpatient Medications  Medication Sig Dispense Refill  . acetaminophen (TYLENOL) 325 MG tablet Take 2 tablets (650 mg total) by mouth every 4 (four) hours as needed for headache or mild pain.    Marland Kitchen aspirin 81 MG chewable tablet Chew 1 tablet (81 mg total) by mouth daily.    Marland Kitchen atorvastatin (LIPITOR) 80 MG tablet Take 1 tablet (80 mg total) by mouth  daily at 6 PM. 90 tablet 3  . Azelastine HCl 0.15 % SOLN USE 2 SPRAYS BY NASAL ROUTE AS NEEDED FOR CONGESTION    . brimonidine (ALPHAGAN) 0.15 % ophthalmic solution Place 1 drop into both eyes 2 (two) times daily.    . calcium carbonate (TUMS - DOSED IN MG ELEMENTAL CALCIUM) 500 MG chewable tablet Chew 0.5 tablets by mouth as needed for indigestion or heartburn.    . Emollient (GOLD BOND ULTIMATE HEALING) CREA Apply 1 application topically daily as needed (wound care).    . Flaxseed, Linseed, (FLAX SEED OIL) 1000 MG CAPS Take 1,000 mg by mouth 2 (two) times daily.    . furosemide (LASIX) 40 MG tablet Take 1 tablet (40 mg total) by mouth daily. For 7 days 7 tablet 0  . loperamide (IMODIUM A-D) 2 MG tablet Take 2-4 mg by mouth 2 (two) times daily as needed for diarrhea or loose stools.    . metoprolol tartrate (LOPRESSOR) 25 MG tablet TAKE 0.5 TABLETS BY MOUTH 2 TIMES DAILY 60 tablet 12  . NASONEX 50 MCG/ACT nasal spray Place 2 sprays into the nose daily as needed (allergies).     . nitroGLYCERIN (NITROSTAT) 0.4 MG SL tablet Place 1 tablet (0.4 mg total) under the tongue every 5 (five) minutes as needed for chest pain. 25 tablet 3  . Omega-3 Fatty  Acids (FISH OIL PO) Take 2 capsules by mouth daily.    . pantoprazole (PROTONIX) 40 MG tablet Take 1 tablet (40 mg total) by mouth daily. 90 tablet 3  . potassium chloride SA (K-DUR,KLOR-CON) 20 MEQ tablet Take 1 tablet (20 mEq total) by mouth daily. 7 tablet 0  . Probiotic CAPS Take 1 capsule by mouth daily.    . ramipril (ALTACE) 5 MG capsule TAKE ONE CAPSULE BY MOUTH DAILY 90 capsule 2  . timolol (BETIMOL) 0.5 % ophthalmic solution Place 1 drop into both eyes 2 (two) times daily.    . traMADol (ULTRAM) 50 MG tablet Take 1 tablet (50 mg total) by mouth every 4 (four) hours as needed for moderate pain. 40 tablet 0  . Turmeric Curcumin 500 MG CAPS Take 500 mg by mouth 2 (two) times daily.    Marland Kitchen warfarin (COUMADIN) 4 MG tablet Take 1 tablet (4 mg total) by  mouth daily at 6 PM. 30 tablet 3   No current facility-administered medications for this visit.     Allergies  Allergen Reactions  . Sulfa Antibiotics Other (See Comments)    Makes patient jittery, prefers not to take them.     Past Medical History:  Diagnosis Date  . Anemia   . AS (aortic stenosis)    Mild  . Barrett's esophagus 11/11/2014  . CAD S/P percutaneous coronary angioplasty    7 stents per the patient (no record).  Stent to 90% inferior branch of an OM and mild scattered plaque in other vessels [facility]  01/08/13.  Cath 2016 with patent stents. He did have 70% stenosis in a small LAD that was about 2 mm. Size  . Coronary artery disease involving coronary bypass graft of native heart with angina pectoris (HCC) 06/27/2017  . Essential hypertension   . GERD (gastroesophageal reflux disease)   . Glaucoma   . Heart murmur   . History of hiatal hernia   . Hypercoagulable state (HCC)   . Hyperlipidemia   . Joint pain   . Lupus   . Peripheral vascular disease (HCC)    PE  dvt   hx 12-14 yrs  . Pneumonia    hx  . Pneumonitis    Lupus  . S/P aortic valve replacement with bioprosthetic valve 10/17/2017   25 mm Edwards Intuity Elite rapid-deployment stented bovine pericardial tissue valve  . S/P CABG x 4 10/17/2017   LIMA to LAD w/ endarterectomy of LAD, SVG to D1, SVG to OM, SVG to RCA, EVH via bilateral thighs  . Severe aortic stenosis   . Unstable angina (HCC) 09/27/2017  . Uses hearing aid     Social History   Socioeconomic History  . Marital status: Married    Spouse name: Not on file  . Number of children: 2  . Years of education: Not on file  . Highest education level: Not on file  Social Needs  . Financial resource strain: Not on file  . Food insecurity - worry: Not on file  . Food insecurity - inability: Not on file  . Transportation needs - medical: Not on file  . Transportation needs - non-medical: Not on file  Occupational History  . Not on file    Tobacco Use  . Smoking status: Never Smoker  . Smokeless tobacco: Never Used  Substance and Sexual Activity  . Alcohol use: Yes    Comment: 2 beers month?  . Drug use: No  . Sexual activity: Not on file  Other Topics Concern  . Not on file  Social History Narrative   Married for 42 years   Has two grown children and three grandchildren.    Retired on disability   No longer have pets. Moved from a home to an apartment   Likes to read,workout, travel.      Family History  Problem Relation Age of Onset  . Diabetes Mother   . Hypertension Mother   . Arthritis Mother   . Diabetes Father   . Hypertension Father   . Arthritis Father   . CAD Father 7560       died age 67 of MI     Review of Systems: General: negative for chills, fever, night sweats or weight changes.  Cardiovascular: negative for chest pain, dyspnea on exertion, edema, orthopnea, palpitations, paroxysmal nocturnal dyspnea or shortness of breath Dermatological: negative for rash Respiratory: negative for cough or wheezing Urologic: negative for hematuria Abdominal: negative for nausea, vomiting, diarrhea, bright red blood per rectum, melena, or hematemesis Neurologic: negative for visual changes, syncope, or dizziness All other systems reviewed and are otherwise negative except as noted above.    Pulse 85, height 5\' 9"  (1.753 m), weight 160 lb (72.6 kg).  General appearance: alert, cooperative and no distress Neck: no JVD Lungs: decreased breath sounds at the bases Heart: regular rate and rhythm and positive rub Extremities: trace edema Skin: pale cool dry Neurologic: Grossly normal  EKG NSR, LBBB  ASSESSMENT AND PLAN:   Fever and chills Post op fever and chill- r/o sepsis May also be atelectasis but the pt tells me he is walking every day  S/P aortic valve replacement with bioprosthetic valve + CABG x4 10/17/17- 25 mm Edwards Intuity Elite rapid-deployment stented bovine pericardial tissue  valve CABG x 4  Lupus anticoagulant with hypercoagulable state (HCC) Chronic Coumadin Rx  CAD S/P percutaneous coronary angioplasty: reports 7 stents History of multiple past PCI's   PLAN  Discussed with Dr Jens Somrenshaw- check an echo, check blood culture, check CBC, Sed Rate, BMP, and BNP. I will make recommendations about continued Lasix use based on labs but he did not appear volume overloaded on exam.   Corine ShelterLuke Dean Goldner PA-C 11/05/2017 4:51 PM

## 2017-11-05 NOTE — Assessment & Plan Note (Signed)
Post op fever and chill- r/o sepsis May also be atelectasis but the pt tells me he is walking every day

## 2017-11-05 NOTE — Assessment & Plan Note (Signed)
History of multiple past PCI's

## 2017-11-05 NOTE — Patient Instructions (Addendum)
Medication Instructions:  Your physician recommends that you continue on your current medications as directed. Please refer to the Current Medication list given to you today.  Labwork: CBC/BNP/SED RATE/BLOOD CULTURES X 2   Testing/Procedures: Your physician has requested that you have an echocardiogram. Echocardiography is a painless test that uses sound waves to create images of your heart. It provides your doctor with information about the size and shape of your heart and how well your heart's chambers and valves are working. This procedure takes approximately one hour. There are no restrictions for this procedure. [facility] AT [address]  Follow-Up: Your physician recommends that you schedule a follow-up appointment in: 6 WEEKS   If you need a refill on your cardiac medications before your next appointment, please call your pharmacy. 9

## 2017-11-05 NOTE — Telephone Encounter (Signed)
-----   Message from Purcell Nailslarence H Owen, MD sent at 11/05/2017 10:21 AM EST ----- [facility] No other rec's  ----- Message ----- From: Steve RattlerBurgess, Jalee Saine F, RN Sent: 11/04/2017  10:45 AM To: Purcell Nailslarence H Owen, MD  Got your message.  The nurse from [facility] called again this morning to say that [name] is still having temperature spikes in the evening.  Last night his temperature got to 101#F and took some Tylenol.  I asked how his incisions looked, she said that they looked great and seemed to be healing well.  I told her that he needed to get in to see Dr. Jens Somrenshaw this week even thought his appointment is next week.  She stated that she would tell him.  I also said that I would relay the temperature message to you for further instruction.  She asked again about Lasix and gave me weights for the past three days which seemed to stay at around a pound difference, and I told her to keep the lasix/ potassium as is, until seen by Cardiology, she acknowledged.  Any further instruction? Thanks. ----- Message ----- From: Purcell Nailswen, Clarence H, MD Sent: 11/02/2017   2:16 PM To: Steve RattlerAshley F Casidee Jann, RN  Dr Jens Somrenshaw is his cardiologist. Please make sure [facility]  ----- Message ----- From: Steve RattlerBurgess, Tamirah George F, RN Sent: 11/01/2017   3:50 PM To: Purcell Nailslarence H Owen, MD  [facility] nurse called this afternoon relaying that [name] had a 3 lbs. Weight gain over night.  She also stated that he did not have any Lasix.  She stated that he did not have any SOB but did present with 1+ dependant edema.  She said that his temp was 99.5 F after tylenol.  She said because he was a "high risk" patient, she notified her physician and he prescribed [name] Lasix and Potassium.  I asked about his wound, and she said it was healing nicely.  I also told her to notify us if his temperature got to be 101 F or higher to call the office.  Please let me know if  there is anything else I need to address.   Thanks, Morrie SheldonAshley

## 2017-11-05 NOTE — Assessment & Plan Note (Signed)
10/17/17- 25 mm Edwards Intuity Elite rapid-deployment stented bovine pericardial tissue valve CABG x 4

## 2017-11-05 NOTE — Telephone Encounter (Signed)
[facility] call from [facility] requesting parameters for weight gain and Lasix usage. Discussed with Eda PaschalLuke K PA who saw him in office today. Will await labs before making any decisions.

## 2017-11-05 NOTE — Telephone Encounter (Signed)
[name] asked that she be called as well as patient so she will know if any medication changes [phone]

## 2017-11-06 ENCOUNTER — Ambulatory Visit (INDEPENDENT_AMBULATORY_CARE_PROVIDER_SITE_OTHER): Payer: PPO | Admitting: General Practice

## 2017-11-06 ENCOUNTER — Telehealth: Payer: Self-pay | Admitting: Family Medicine

## 2017-11-06 DIAGNOSIS — Z7901 Long term (current) use of anticoagulants: Secondary | ICD-10-CM | POA: Diagnosis not present

## 2017-11-06 DIAGNOSIS — D6862 Lupus anticoagulant syndrome: Secondary | ICD-10-CM

## 2017-11-06 LAB — BRAIN NATRIURETIC PEPTIDE: Brain Natriuretic Peptide: 300 pg/mL — ABNORMAL HIGH (ref <100)

## 2017-11-06 LAB — CBC WITH DIFFERENTIAL/PLATELET
Basophils Absolute: 29 cells/uL (ref 0–200)
Basophils Relative: 0.5 %
Eosinophils Absolute: 120 cells/uL (ref 15–500)
Eosinophils Relative: 2.1 %
HCT: 27 % — ABNORMAL LOW (ref 38.5–50.0)
Hemoglobin: 8.6 g/dL — ABNORMAL LOW (ref 13.2–17.1)
Lymphs Abs: 2035 cells/uL (ref 850–3900)
MCH: 26 pg — ABNORMAL LOW (ref 27.0–33.0)
MCHC: 31.9 g/dL — ABNORMAL LOW (ref 32.0–36.0)
MCV: 81.6 fL (ref 80.0–100.0)
MPV: 10.8 fL (ref 7.5–12.5)
Monocytes Relative: 9.8 %
Neutro Abs: 2958 cells/uL (ref 1500–7800)
Neutrophils Relative %: 51.9 %
Platelets: 251 10*3/uL (ref 140–400)
RBC: 3.31 10*6/uL — ABNORMAL LOW (ref 4.20–5.80)
RDW: 15.1 % — ABNORMAL HIGH (ref 11.0–15.0)
Total Lymphocyte: 35.7 %
WBC mixed population: 559 cells/uL (ref 200–950)
WBC: 5.7 10*3/uL (ref 3.8–10.8)

## 2017-11-06 LAB — SEDIMENTATION RATE: Sed Rate: 70 mm/h — ABNORMAL HIGH (ref 0–20)

## 2017-11-06 LAB — POCT INR: INR: 4.4

## 2017-11-06 NOTE — Progress Notes (Signed)
I have reviewed and agree with this plan  

## 2017-11-06 NOTE — Telephone Encounter (Signed)
Copied from CRM [id number]. Topic: General - Other >> Oct 31, 2017 10:12 AM Stephannie LiSimmons, Janett L, NT wrote: Reason for CRM: Pattie from [facility] acalled and needs a verbal for to draw PTI, INR ar home  so he does not have to go to the lab ,please call  [phone]   >> Nov 06, 2017 12:18 PM Cipriano BunkerLambe, Annette S wrote: Alexia Freestoneatty [facility] [phone] asking Arline Aspindy from [facility],   please call regarding order.

## 2017-11-06 NOTE — Patient Instructions (Addendum)
Pre visit review using our clinic review tool, if applicable. No additional management support is needed unless otherwise documented below in the visit note.  Skip coumadin today and tomorrow and take 1 tablet daily except 1/2 tablet on Monday/Thursdays. Recheck in 1 week.  Dosing instructions given to LiberalPatty, RN @ [facility].

## 2017-11-07 ENCOUNTER — Telehealth: Payer: Self-pay | Admitting: Cardiology

## 2017-11-07 NOTE — Telephone Encounter (Signed)
I called Mr [name]. He says he feels pretty good. His Temp went to 99 last night and he took Tylenol. We discussed his labs and when to take Lasix (wgt gain of 5 lbs). I told him to call or go to the ED if his Temp went higher than 100.5. I explained this may be from atelectasis post op (decreased breath sounds at bases on exam) or possible post op pericarditis (pericardial rub on exam). He will follow through with the echo we ordered.   Corine ShelterLUKE Tylon Kemmerling PA-C 11/07/2017 12:32 PM

## 2017-11-07 NOTE — Telephone Encounter (Signed)
Kenneth PaschalLuke K Keith called and spoke [address]

## 2017-11-07 NOTE — Telephone Encounter (Signed)
This has been addressed with Alexia FreestonePatty, RN @ [facility]

## 2017-11-07 NOTE — Telephone Encounter (Signed)
I called Patty with Home Health. The pt's B/P is 110 systolic.  His wgt was 160 lbs when I saw him 11/05/17. His wgt pre op was 165 lbs. His temperature went to 101 once a few days ago. His Temp in the evening goes to 99. His blood cultures are negative, his WBC was 5.7 and his BNP was 300. I suggested they only give Lasix 20 mg if his wgt goes up 5 lbs. I did not prescribe any antibiotics but if his temperature does go over 100 he may need antibiotics, I would consult with Dr Cornelius Moraswen then. He does have an appointment with Dr Cornelius Moraswen 11/25/17.   Corine ShelterLUKE Vineeth Fell PA-C 11/07/2017 12:26 PM

## 2017-11-07 NOTE — Telephone Encounter (Signed)
Follow up     Please call Alexia Freestoneatty [facility] [phone]

## 2017-11-08 ENCOUNTER — Ambulatory Visit (HOSPITAL_COMMUNITY): Payer: PPO | Attending: Internal Medicine

## 2017-11-08 DIAGNOSIS — R011 Cardiac murmur, unspecified: Secondary | ICD-10-CM | POA: Insufficient documentation

## 2017-11-08 DIAGNOSIS — I119 Hypertensive heart disease without heart failure: Secondary | ICD-10-CM | POA: Diagnosis not present

## 2017-11-08 DIAGNOSIS — R0602 Shortness of breath: Secondary | ICD-10-CM | POA: Insufficient documentation

## 2017-11-08 DIAGNOSIS — M329 Systemic lupus erythematosus, unspecified: Secondary | ICD-10-CM | POA: Insufficient documentation

## 2017-11-08 DIAGNOSIS — I359 Nonrheumatic aortic valve disorder, unspecified: Secondary | ICD-10-CM | POA: Diagnosis present

## 2017-11-08 DIAGNOSIS — Z953 Presence of xenogenic heart valve: Secondary | ICD-10-CM | POA: Insufficient documentation

## 2017-11-08 DIAGNOSIS — R509 Fever, unspecified: Secondary | ICD-10-CM | POA: Insufficient documentation

## 2017-11-08 DIAGNOSIS — Z951 Presence of aortocoronary bypass graft: Secondary | ICD-10-CM | POA: Diagnosis not present

## 2017-11-08 DIAGNOSIS — E785 Hyperlipidemia, unspecified: Secondary | ICD-10-CM | POA: Insufficient documentation

## 2017-11-08 DIAGNOSIS — I081 Rheumatic disorders of both mitral and tricuspid valves: Secondary | ICD-10-CM | POA: Insufficient documentation

## 2017-11-08 DIAGNOSIS — I2510 Atherosclerotic heart disease of native coronary artery without angina pectoris: Secondary | ICD-10-CM | POA: Diagnosis not present

## 2017-11-11 ENCOUNTER — Telehealth: Payer: Self-pay | Admitting: Cardiology

## 2017-11-11 ENCOUNTER — Other Ambulatory Visit: Payer: Self-pay | Admitting: Cardiology

## 2017-11-11 ENCOUNTER — Telehealth: Payer: Self-pay

## 2017-11-11 ENCOUNTER — Ambulatory Visit: Payer: PPO

## 2017-11-11 ENCOUNTER — Ambulatory Visit: Payer: PPO | Admitting: Physician Assistant

## 2017-11-11 DIAGNOSIS — R5082 Postprocedural fever: Secondary | ICD-10-CM

## 2017-11-11 LAB — CULTURE, BLOOD (SINGLE)
MICRO NUMBER:: 90091353
MICRO NUMBER:: 90091355
Result:: NO GROWTH
SPECIMEN QUALITY:: ADEQUATE

## 2017-11-11 NOTE — Telephone Encounter (Signed)
Spoke with patient informed patient of of [name] instructions to have cxr and u/a. Patient voiced understanding.

## 2017-11-11 NOTE — Progress Notes (Signed)
I would send him back to surgery if they have not seen them.  Maybe get him on an APP schedule and give them a heads up.

## 2017-11-11 NOTE — Telephone Encounter (Signed)
Pt called- he is still having PM chills and low grade fever. Otherwise he says he feels well during the day. Echo, blood cultures, and WBC all looked good. I will pass this on to Dr Cornelius Moraswen and Dr Antoine PocheHochrein, I did order a PA and Lat CXR today.  Corine ShelterLUKE Scottlynn Lindell PA-C 11/11/2017 11:23 AM

## 2017-11-11 NOTE — Telephone Encounter (Signed)
-----   Message from Abelino DerrickLuke K Kilroy, [place] JerseyPA-C sent at 11/11/2017  3:20 PM EST ----- It means an out patient PA and lateral CXR   ----- Message ----- From: Kenneth FerraraYoung, Darl Kuss T, CMA Sent: 11/11/2017   2:59 PM To: Abelino DerrickLuke K Kilroy, PA-C  Luke What is a OP PA?  Ok I can handle the rest.  ----- Message ----- From: Abelino DerrickKilroy, Luke K, PA-C Sent: 11/11/2017  11:15 AM To: Kenneth Ferraraierica T Halee Glynn, CMA  T can you arrange for Mr [name] to have an OP PA and lateral CXR- reason is post op fevers. I called him earlier and spoke to him. You can tell him I wanted to get the CXR.  [name] PA-C 11/11/2017 11:16 AM

## 2017-11-12 ENCOUNTER — Ambulatory Visit
Admission: RE | Admit: 2017-11-12 | Discharge: 2017-11-12 | Disposition: A | Discharge status: Home / Self Care | Payer: PPO | Source: Ambulatory Visit | Attending: Cardiology | Admitting: Cardiology

## 2017-11-12 DIAGNOSIS — J90 Pleural effusion, not elsewhere classified: Secondary | ICD-10-CM | POA: Diagnosis not present

## 2017-11-12 DIAGNOSIS — R5082 Postprocedural fever: Secondary | ICD-10-CM

## 2017-11-12 DIAGNOSIS — R0602 Shortness of breath: Secondary | ICD-10-CM | POA: Diagnosis not present

## 2017-11-12 DIAGNOSIS — R509 Fever, unspecified: Secondary | ICD-10-CM | POA: Diagnosis not present

## 2017-11-12 DIAGNOSIS — Z953 Presence of xenogenic heart valve: Secondary | ICD-10-CM | POA: Diagnosis not present

## 2017-11-13 ENCOUNTER — Ambulatory Visit (INDEPENDENT_AMBULATORY_CARE_PROVIDER_SITE_OTHER): Payer: PPO | Admitting: General Practice

## 2017-11-13 ENCOUNTER — Telehealth: Payer: Self-pay | Admitting: Family Medicine

## 2017-11-13 ENCOUNTER — Telehealth: Payer: Self-pay | Admitting: Cardiology

## 2017-11-13 DIAGNOSIS — Z7901 Long term (current) use of anticoagulants: Secondary | ICD-10-CM

## 2017-11-13 DIAGNOSIS — R509 Fever, unspecified: Secondary | ICD-10-CM | POA: Diagnosis not present

## 2017-11-13 DIAGNOSIS — D6862 Lupus anticoagulant syndrome: Secondary | ICD-10-CM

## 2017-11-13 LAB — POCT INR: INR: 3.3

## 2017-11-13 LAB — SEDIMENTATION RATE: Sed Rate: 24 mm/h (ref 0–30)

## 2017-11-13 NOTE — Patient Instructions (Addendum)
.  lbpcmh  Skip coumadin today and continue to take 1 tablet daily except 1/2 tablet on Thursdays. Recheck in 1 week.  Dosing instructions given to MenardPatty, RN @ [facility].

## 2017-11-13 NOTE — Telephone Encounter (Signed)
Copied from CRM [id number]. Topic: Quick Communication - See Telephone Encounter >> Nov 13, 2017  9:07 AM Herby AbrahamJohnson, Shiquita C wrote: CRM for notification. See Telephone encounter for: Nurse Patty with [facility] called in to speak with a nurse. Please call back at: [phone]    11/13/17.

## 2017-11-13 NOTE — Telephone Encounter (Signed)
Spoke with patty, patient did not urine as ordered. Verbal given for patient to get a UA. Order placed for quest due to patient preference.

## 2017-11-13 NOTE — Telephone Encounter (Signed)
Return phone call to Pattie from [facility]. Pattie stated that she poke with Bailey Mechindy Boyd about the issue she called about.

## 2017-11-13 NOTE — Telephone Encounter (Signed)
Patty RN at [facility] is calling about [name] to find out if [name] wants him to have a Urine Analysis Culture done on today . Please call

## 2017-11-14 ENCOUNTER — Telehealth: Payer: Self-pay | Admitting: Cardiology

## 2017-11-14 LAB — URINALYSIS
BILIRUBIN URINE: NEGATIVE
GLUCOSE, UA: NEGATIVE
HGB URINE DIPSTICK: NEGATIVE
KETONES UR: NEGATIVE
LEUKOCYTES UA: NEGATIVE
Nitrite: NEGATIVE
PROTEIN: NEGATIVE
Specific Gravity, Urine: 1.012 (ref 1.001–1.03)
pH: 6.5 (ref 5.0–8.0)

## 2017-11-14 NOTE — Telephone Encounter (Signed)
Spoke with jim and patient wife, the patient is watching sodium and fluid restrictions. His right ankle is greater than the left and his weight is gradually going up. They are very concerned, okay given for patient to take 1/2 of the furosemide and potassium today. They will continue to monitor and call with concerns.

## 2017-11-14 NOTE — Progress Notes (Signed)
I have reviewed this visit and I agree on the patient's plan of dosage and recommendations. Jenniferann Stuckert, DO   

## 2017-11-14 NOTE — Telephone Encounter (Signed)
New Message   Pt c/o swelling: STAT is pt has developed SOB within 24 hours  1) How much weight have you gained and in what time span? 3lbs  In 3 days  2) If swelling, where is the swelling located? Ankle   3) Are you currently taking a fluid pill? No. Patient was advised not to take lasik until he gains 5lbs  4) Are you currently SOB? No   5) Do you have a log of your daily weights (if so, list)? 1/31 158.6, 1/30 157.8, 1/28 155.4  6) Have you gained 3 pounds in a day or 5 pounds in a week? No   7) Have you traveled recently? No   Jim with [facility] is calling on behalf of patient. Please call to discuss.

## 2017-11-15 NOTE — Progress Notes (Signed)
Yes.  We cal call it in.

## 2017-11-19 LAB — CULTURE, BLOOD (SINGLE)

## 2017-11-20 ENCOUNTER — Ambulatory Visit (INDEPENDENT_AMBULATORY_CARE_PROVIDER_SITE_OTHER): Payer: PPO | Admitting: General Practice

## 2017-11-20 DIAGNOSIS — D6862 Lupus anticoagulant syndrome: Secondary | ICD-10-CM

## 2017-11-20 DIAGNOSIS — Z7901 Long term (current) use of anticoagulants: Secondary | ICD-10-CM | POA: Diagnosis not present

## 2017-11-20 LAB — POCT INR: INR: 3.3

## 2017-11-20 NOTE — Patient Instructions (Signed)
Change dosage and take 1 tablet daily except 1/2 tablet on Sundays and Thursdays. Recheck in 1 week.  Dosing instructions given to HeuveltonPatty, RN @ [facility].

## 2017-11-20 NOTE — Progress Notes (Signed)
I have reviewed and agree with note, evaluation, plan.   Mahesh Sizemore, MD  

## 2017-11-25 ENCOUNTER — Ambulatory Visit: Payer: PPO | Admitting: Thoracic Surgery (Cardiothoracic Vascular Surgery)

## 2017-11-27 ENCOUNTER — Ambulatory Visit (INDEPENDENT_AMBULATORY_CARE_PROVIDER_SITE_OTHER): Payer: PPO | Admitting: General Practice

## 2017-11-27 DIAGNOSIS — M25561 Pain in right knee: Secondary | ICD-10-CM | POA: Diagnosis not present

## 2017-11-27 DIAGNOSIS — Z7901 Long term (current) use of anticoagulants: Secondary | ICD-10-CM

## 2017-11-27 DIAGNOSIS — D6862 Lupus anticoagulant syndrome: Secondary | ICD-10-CM

## 2017-11-27 DIAGNOSIS — M1711 Unilateral primary osteoarthritis, right knee: Secondary | ICD-10-CM | POA: Diagnosis not present

## 2017-11-27 DIAGNOSIS — S86811D Strain of other muscle(s) and tendon(s) at lower leg level, right leg, subsequent encounter: Secondary | ICD-10-CM | POA: Diagnosis not present

## 2017-11-27 LAB — POCT INR: INR: 4.7

## 2017-11-27 NOTE — Progress Notes (Signed)
I have reviewed and agree with this plan  

## 2017-11-27 NOTE — Patient Instructions (Addendum)
Pre visit review using our clinic review tool, if applicable. No additional management support is needed unless otherwise documented below in the visit note.  Skip coumadin today and tomorrow (2/13 and 2/14) and take 4 mg Friday, 2 mg Saturday and Sunday, and 4 mg Monday and Tuesday.  Re-check on Wednesday.   Recheck in 1 week.  Dosing instructions given to JosephvillePatty, RN @ [facility].

## 2017-12-02 ENCOUNTER — Telehealth: Payer: Self-pay | Admitting: Cardiology

## 2017-12-02 NOTE — Telephone Encounter (Signed)
New message   Patty from [facility] calling to report swelling, SOB and a few episodes of patient gasping for air when he wakes up. Please call  Pt c/o swelling: STAT is pt has developed SOB within 24 hours  1) How much weight have you gained and in what time span? 6lbs in 5 days  2) If swelling, where is the swelling located? ANKLES  3) Are you currently taking a fluid pill? YES  4) Are you currently SOB? YES  5) Do you have a log of your daily weights (if so, list)? 162, 163, 161, 157.4  6) Have you gained 3 pounds in a day or 5 pounds in a week? YES  7) Have you traveled recently? NO

## 2017-12-02 NOTE — Telephone Encounter (Signed)
Per pt felt bad yesterday and this am was unable to exercise was able to take a Furosemide 40 mg today and this has helped feels better now  was able to do some walking also notes elevated hr of 85 with just sitting Will forward to Dr Antoine PocheHochrein for review .Zack Seal/cy

## 2017-12-03 ENCOUNTER — Telehealth: Payer: Self-pay | Admitting: Cardiology

## 2017-12-03 LAB — POCT INR: INR: 3.2

## 2017-12-03 NOTE — Telephone Encounter (Signed)
[facility].She stated patient has gained 6 lbs within the past 1 week.He has been waking up at night sob.Stated he took Lasix 40 mg with a Kdur 20 meq yesterday.Stated he lost 1 lb.Advised he can take Lasix 40 mg with a Kdur 20 meq daily for the next 2 days.Advised to get a bmet in 1 week.Advised to call back if not better.

## 2017-12-04 ENCOUNTER — Ambulatory Visit (INDEPENDENT_AMBULATORY_CARE_PROVIDER_SITE_OTHER): Payer: PPO | Admitting: General Practice

## 2017-12-04 DIAGNOSIS — Z7901 Long term (current) use of anticoagulants: Secondary | ICD-10-CM

## 2017-12-04 DIAGNOSIS — D6862 Lupus anticoagulant syndrome: Secondary | ICD-10-CM

## 2017-12-04 NOTE — Progress Notes (Signed)
I have reviewed and agree with note, evaluation, plan.   Rubin Dais, MD  

## 2017-12-04 NOTE — Patient Instructions (Signed)
Take take 2 mg daily except 4 mg on Mondays and Tuesdays. Re-check on Wednesday.   Dosing instructions given to MaustonPatty, RN @ [facility]. [phone].  Dosing instructions also given to patient.

## 2017-12-04 NOTE — Progress Notes (Signed)
I have reviewed and agree with this plan  

## 2017-12-05 ENCOUNTER — Other Ambulatory Visit: Payer: Self-pay | Admitting: Thoracic Surgery (Cardiothoracic Vascular Surgery)

## 2017-12-05 DIAGNOSIS — Z951 Presence of aortocoronary bypass graft: Secondary | ICD-10-CM

## 2017-12-06 NOTE — Telephone Encounter (Signed)
Addressed in the note from the next day.

## 2017-12-06 NOTE — Telephone Encounter (Signed)
Agree with plan.  Please call him on Monday to see how he is doing.

## 2017-12-09 ENCOUNTER — Ambulatory Visit: Payer: Self-pay

## 2017-12-10 ENCOUNTER — Ambulatory Visit
Admission: RE | Admit: 2017-12-10 | Discharge: 2017-12-10 | Disposition: A | Discharge status: Home / Self Care | Payer: PPO | Source: Ambulatory Visit | Attending: Thoracic Surgery (Cardiothoracic Vascular Surgery) | Admitting: Thoracic Surgery (Cardiothoracic Vascular Surgery)

## 2017-12-10 ENCOUNTER — Other Ambulatory Visit: Payer: Self-pay

## 2017-12-10 ENCOUNTER — Ambulatory Visit (INDEPENDENT_AMBULATORY_CARE_PROVIDER_SITE_OTHER): Payer: Self-pay | Admitting: Physician Assistant

## 2017-12-10 VITALS — BP 112/75 | HR 86 | Resp 18 | Ht 69.0 in | Wt 156.0 lb

## 2017-12-10 DIAGNOSIS — I25810 Atherosclerosis of coronary artery bypass graft(s) without angina pectoris: Secondary | ICD-10-CM | POA: Diagnosis not present

## 2017-12-10 DIAGNOSIS — Z953 Presence of xenogenic heart valve: Secondary | ICD-10-CM

## 2017-12-10 DIAGNOSIS — J9811 Atelectasis: Secondary | ICD-10-CM | POA: Diagnosis not present

## 2017-12-10 DIAGNOSIS — Z9861 Coronary angioplasty status: Secondary | ICD-10-CM

## 2017-12-10 DIAGNOSIS — I2510 Atherosclerotic heart disease of native coronary artery without angina pectoris: Secondary | ICD-10-CM

## 2017-12-10 DIAGNOSIS — Z951 Presence of aortocoronary bypass graft: Secondary | ICD-10-CM

## 2017-12-10 DIAGNOSIS — I350 Nonrheumatic aortic (valve) stenosis: Secondary | ICD-10-CM

## 2017-12-10 NOTE — Telephone Encounter (Signed)
Call and spoke with pt to see how he was doing, pt he's doing fine, he had lost 5 lbs after taking extra lasix, and he is going to see Dr Cornelius Moraswen today at 2 pm.

## 2017-12-10 NOTE — Progress Notes (Signed)
HPI:  Patient returns for routine postoperative follow-up having undergone AVR, CABG x 4 on 10/17/2017. The patient's early postoperative recovery while in the hospital was notable for Atrial Fibrillation. However he is on coumadin for H/O of DVT and possible hyper-coaguable state. Since hospital discharge the patient reports he has been slow to recover.  He states that he has had a week of low grade temperatures.  He states they seem to occur at night and are worse if he has been outside.  Cardiology has performed a workup with negative blood cultures and urinalysis.  His wounds are healing without evidence of infection.  He also states he has not sleeping well and has awoken from sleep several times gasping for breath.  He states however recently this has improved.  He is ambulating 4x per day without difficulty, however he states the weather has been hindering him becoming more active.  His weight is down and he is monitoring per Cardiology recommendations and takes Lasix as needed for edema and weight gain.  Current Outpatient Medications  Medication Sig Dispense Refill  . acetaminophen (TYLENOL) 325 MG tablet Take 2 tablets (650 mg total) by mouth every 4 (four) hours as needed for headache or mild pain.    Marland Kitchen. aspirin 81 MG chewable tablet Chew 1 tablet (81 mg total) by mouth daily.    Marland Kitchen. atorvastatin (LIPITOR) 80 MG tablet Take 1 tablet (80 mg total) by mouth daily at 6 PM. 90 tablet 3  . Azelastine HCl 0.15 % SOLN USE 2 SPRAYS BY NASAL ROUTE AS NEEDED FOR CONGESTION    . brimonidine (ALPHAGAN) 0.15 % ophthalmic solution Place 1 drop into both eyes 2 (two) times daily.    . calcium carbonate (TUMS - DOSED IN MG ELEMENTAL CALCIUM) 500 MG chewable tablet Chew 0.5 tablets by mouth as needed for indigestion or heartburn.    . Emollient (GOLD BOND ULTIMATE HEALING) CREA Apply 1 application topically daily as needed (wound care).    . Flaxseed, Linseed, (FLAX SEED OIL) 1000 MG CAPS Take 1,000 mg by mouth 2  (two) times daily.    . furosemide (LASIX) 40 MG tablet Take 1 tablet (40 mg total) by mouth daily. For 7 days 7 tablet 0  . loperamide (IMODIUM A-D) 2 MG tablet Take 2-4 mg by mouth 2 (two) times daily as needed for diarrhea or loose stools.    . metoprolol tartrate (LOPRESSOR) 25 MG tablet TAKE 0.5 TABLETS BY MOUTH 2 TIMES DAILY 60 tablet 12  . NASONEX 50 MCG/ACT nasal spray Place 2 sprays into the nose daily as needed (allergies).     . nitroGLYCERIN (NITROSTAT) 0.4 MG SL tablet Place 1 tablet (0.4 mg total) under the tongue every 5 (five) minutes as needed for chest pain. 25 tablet 3  . pantoprazole (PROTONIX) 40 MG tablet Take 1 tablet (40 mg total) by mouth daily. 90 tablet 3  . potassium chloride SA (K-DUR,KLOR-CON) 20 MEQ tablet Take 1 tablet (20 mEq total) by mouth daily. 7 tablet 0  . ramipril (ALTACE) 5 MG capsule TAKE ONE CAPSULE BY MOUTH DAILY 90 capsule 2  . timolol (BETIMOL) 0.5 % ophthalmic solution Place 1 drop into both eyes 2 (two) times daily.    . traMADol (ULTRAM) 50 MG tablet Take 1 tablet (50 mg total) by mouth every 4 (four) hours as needed for moderate pain. 40 tablet 0  . warfarin (COUMADIN) 4 MG tablet Take 1 tablet (4 mg total) by mouth daily at 6 PM. 30  tablet 3  . Omega-3 Fatty Acids (FISH OIL PO) Take 2 capsules by mouth daily.    . Turmeric Curcumin 500 MG CAPS Take 500 mg by mouth 2 (two) times daily.     No current facility-administered medications for this visit.     Physical Exam:  BP 112/75 (BP Location: Right Arm, Patient Position: Sitting, Cuff Size: Large)   Pulse 86   Resp 18   Ht 5\' 9"  (1.753 m)   Wt 156 lb (70.8 kg)   SpO2 96% Comment: RA  BMI 23.04 kg/m   Gen: no apparent distress Heart: RRR Lungs: CTA bilaterally Ext: no edema present Incisions: well healed, no signs or evidence of infection  Diagnostic Tests:  CXR: some residual atelectasis, minimal pleural effusions  A/P:  1. S/P AVR, CABG x 4- patient seems to be slow to  recover, however he has made big improvements over the last week.  He is maintaining NSR.  He is euvolemic on exam 2. INR- coumadin is therapeutic for H/O DVT 3. Referral to Cardiac rehab placed, okay to proceed 4. Activity- increase as tolerated, patient given instructions in regards to weight limitations.  He was instructed he may start driving short distances 5. RTC in 3 months, if patient feels he is not progressing, he was instructed to contact office for earlier followup  Lowella Dandy, PA-C [facility] [phone]

## 2017-12-11 ENCOUNTER — Ambulatory Visit (INDEPENDENT_AMBULATORY_CARE_PROVIDER_SITE_OTHER): Payer: PPO | Admitting: General Practice

## 2017-12-11 ENCOUNTER — Telehealth: Payer: Self-pay | Admitting: Cardiology

## 2017-12-11 DIAGNOSIS — Z7901 Long term (current) use of anticoagulants: Secondary | ICD-10-CM

## 2017-12-11 DIAGNOSIS — D6862 Lupus anticoagulant syndrome: Secondary | ICD-10-CM

## 2017-12-11 LAB — POCT INR: INR: 2.3

## 2017-12-11 NOTE — Patient Instructions (Signed)
Pre visit review using our clinic review tool, if applicable. No additional management support is needed unless otherwise documented below in the visit note.  Continue to take 2 mg daily except 4 mg on Monday and Tuesdays.  Re-check on Wednesday.   Dosing instructions given to Vianammy, RN @ [facility]. [phone].

## 2017-12-11 NOTE — Telephone Encounter (Signed)
Received call from Tammy RN regarding patients INR. INR today is 2.3. Advised Tammy PCP monitors INR. Gave her PCP number and she will follow up with them. Will also forward note to Lodema Pilotynthia Boyd RN than has taken care of INR's previously

## 2017-12-11 NOTE — Progress Notes (Signed)
I have reviewed and agree with this plan  

## 2017-12-16 ENCOUNTER — Telehealth: Payer: Self-pay | Admitting: Cardiology

## 2017-12-16 NOTE — Telephone Encounter (Signed)
Spoke with patty, they were unable to draw his bmp last week and they are calling to find out if we still want it. They were told we still needed the bmp and they will draw when they do his INR Wednesday this week.

## 2017-12-16 NOTE — Telephone Encounter (Signed)
[name] calling with [facility] , would like to speak with nurse and did not give details.

## 2017-12-17 ENCOUNTER — Telehealth (HOSPITAL_COMMUNITY): Payer: Self-pay

## 2017-12-17 NOTE — Telephone Encounter (Signed)
Called to speak with patient in regards to Cardiac Rehab - patient is interested in the program. Scheduled orientation on 01/28/2018 at 8:30am. Patient will attend the 6:45am exc class. Went over insurance with patient and patient stated he understands what he is responsible for. Mailed packet.

## 2017-12-18 ENCOUNTER — Ambulatory Visit (INDEPENDENT_AMBULATORY_CARE_PROVIDER_SITE_OTHER): Payer: PPO | Admitting: General Practice

## 2017-12-18 DIAGNOSIS — Z7901 Long term (current) use of anticoagulants: Secondary | ICD-10-CM | POA: Diagnosis not present

## 2017-12-18 DIAGNOSIS — D6862 Lupus anticoagulant syndrome: Secondary | ICD-10-CM

## 2017-12-18 DIAGNOSIS — I2510 Atherosclerotic heart disease of native coronary artery without angina pectoris: Secondary | ICD-10-CM | POA: Diagnosis not present

## 2017-12-18 LAB — POCT INR: INR: 2.1

## 2017-12-18 NOTE — Patient Instructions (Signed)
Pre visit review using our clinic review tool, if applicable. No additional management support is needed unless otherwise documented below in the visit note.  Continue to take 2 mg daily except 4 mg on Monday and Tuesdays.  Re-check on Wednesday.   Dosing instructions given to PaiaPatty, RN @ [facility]. [phone].  Patient dc'd from home health today.

## 2017-12-19 ENCOUNTER — Ambulatory Visit: Payer: PPO | Admitting: Cardiology

## 2017-12-19 ENCOUNTER — Encounter: Payer: Self-pay | Admitting: Cardiology

## 2017-12-19 VITALS — BP 122/74 | HR 68 | Ht 68.0 in | Wt 158.0 lb

## 2017-12-19 DIAGNOSIS — Z953 Presence of xenogenic heart valve: Secondary | ICD-10-CM | POA: Diagnosis not present

## 2017-12-19 DIAGNOSIS — I2510 Atherosclerotic heart disease of native coronary artery without angina pectoris: Secondary | ICD-10-CM

## 2017-12-19 DIAGNOSIS — Z9861 Coronary angioplasty status: Secondary | ICD-10-CM

## 2017-12-19 DIAGNOSIS — I10 Essential (primary) hypertension: Secondary | ICD-10-CM | POA: Diagnosis not present

## 2017-12-19 DIAGNOSIS — I350 Nonrheumatic aortic (valve) stenosis: Secondary | ICD-10-CM | POA: Diagnosis not present

## 2017-12-19 DIAGNOSIS — M3213 Lung involvement in systemic lupus erythematosus: Secondary | ICD-10-CM | POA: Diagnosis not present

## 2017-12-19 DIAGNOSIS — E785 Hyperlipidemia, unspecified: Secondary | ICD-10-CM | POA: Diagnosis not present

## 2017-12-19 DIAGNOSIS — Z951 Presence of aortocoronary bypass graft: Secondary | ICD-10-CM

## 2017-12-19 DIAGNOSIS — D6862 Lupus anticoagulant syndrome: Secondary | ICD-10-CM

## 2017-12-19 DIAGNOSIS — Z7901 Long term (current) use of anticoagulants: Secondary | ICD-10-CM

## 2017-12-19 MED ORDER — PREDNISONE 1 MG PO TABS: 10.0000 mg | ORAL_TABLET | Freq: Every day | ORAL | Status: DC

## 2017-12-19 MED ORDER — PREDNISONE 10 MG PO TABS: ORAL_TABLET | ORAL | 0 refills | Status: DC

## 2017-12-19 NOTE — Progress Notes (Signed)
12/19/2017 [name]   [birth year]  [id number]  Primary Physician Nafziger, Kandee Keen, NP Primary Cardiologist: Dr Antoine Poche  HPI:    67 y/o male with a history of CAD, s/p multiple stents in 2014 at [facility] He moved to [place] after this and has been followed here since. He was seen in the office 09/27/17 with complaints of accelerating exertional angina. He also has known moderate to severe AS and Lupus anticoagulant with a history of PE and DVT. He is on chronic Coumadin. He was admitted for Rt and Lt heart cath 10/02/17. This revealed severe progression of CAD in all three major arteries and his OM (his previously placed pRCA, CFX x 2, OM1, and LAD stents were all patent). The pt was seen in consult by Dr Cornelius Moras who recommended CABG and AVR. Unfortunately the pt developed a pseudoaneurysm post cath. He was seen on consult by Dr Randie Heinz and this was injected by Dr Darrick Penna on 10/03/17.F/U doppler 2 hrs post confirmed successful closure. He was placed on Lovenox and discharge with plans to readmit him Jan 3'd for surgery. On 10/17/17 he underwent CABG x 4 with an LIMA-LAD, SVG-RCA, SVG-Dx1, and an SVG-OM. He also had a pericardial tissue AVR placed. He was discharged 10/22/17. Since discharge he has been slow to progress. He had some PM fevers. Echo, blood cultures, CXR, and WBC all were unremarkable. He is in the office today for follow up. He is slowly getting his strength back but still feels like he should be farther along. He has not had any fevers in a week. He does have a rub on exam and Dr Cornelius Moras had suggested we consider a dose pack- I'll go ahead and start that now. The pt did relate to me that he had been on steroids "for 25 years" for Lupus. He was weaned of steroids 5-6 years ago.      Current Outpatient Medications  Medication Sig Dispense Refill  . acetaminophen (TYLENOL) 325 MG tablet Take 2 tablets (650 mg total) by mouth every 4 (four) hours as needed for headache or mild pain.    Marland Kitchen  aspirin 81 MG chewable tablet Chew 1 tablet (81 mg total) by mouth daily.    Marland Kitchen atorvastatin (LIPITOR) 80 MG tablet Take 1 tablet (80 mg total) by mouth daily at 6 PM. 90 tablet 3  . Azelastine HCl 0.15 % SOLN USE 2 SPRAYS BY NASAL ROUTE AS NEEDED FOR CONGESTION    . brimonidine (ALPHAGAN) 0.15 % ophthalmic solution Place 1 drop into both eyes 2 (two) times daily.    . calcium carbonate (TUMS - DOSED IN MG ELEMENTAL CALCIUM) 500 MG chewable tablet Chew 0.5 tablets by mouth as needed for indigestion or heartburn.    . Emollient (GOLD BOND ULTIMATE HEALING) CREA Apply 1 application topically daily as needed (wound care).    . Flaxseed, Linseed, (FLAX SEED OIL) 1000 MG CAPS Take 1,000 mg by mouth 2 (two) times daily.    . furosemide (LASIX) 40 MG tablet Take 1 tablet (40 mg total) by mouth daily. For 7 days 7 tablet 0  . loperamide (IMODIUM A-D) 2 MG tablet Take 2-4 mg by mouth 2 (two) times daily as needed for diarrhea or loose stools.    . metoprolol tartrate (LOPRESSOR) 25 MG tablet TAKE 0.5 TABLETS BY MOUTH 2 TIMES DAILY 60 tablet 12  . NASONEX 50 MCG/ACT nasal spray Place 2 sprays into the nose daily as needed (allergies).     Marland Kitchen  nitroGLYCERIN (NITROSTAT) 0.4 MG SL tablet Place 1 tablet (0.4 mg total) under the tongue every 5 (five) minutes as needed for chest pain. 25 tablet 3  . Omega-3 Fatty Acids (FISH OIL PO) Take 2 capsules by mouth daily.    . pantoprazole (PROTONIX) 40 MG tablet Take 1 tablet (40 mg total) by mouth daily. 90 tablet 3  . potassium chloride SA (K-DUR,KLOR-CON) 20 MEQ tablet Take 1 tablet (20 mEq total) by mouth daily. 7 tablet 0  . ramipril (ALTACE) 5 MG capsule TAKE ONE CAPSULE BY MOUTH DAILY 90 capsule 2  . timolol (BETIMOL) 0.5 % ophthalmic solution Place 1 drop into both eyes 2 (two) times daily.    . traMADol (ULTRAM) 50 MG tablet Take 1 tablet (50 mg total) by mouth every 4 (four) hours as needed for moderate pain. 40 tablet 0  . Turmeric Curcumin 500 MG CAPS Take 500  mg by mouth 2 (two) times daily.    Marland Kitchen warfarin (COUMADIN) 4 MG tablet Take 1 tablet (4 mg total) by mouth daily at 6 PM. 30 tablet 3   No current facility-administered medications for this visit.     Allergies  Allergen Reactions  . Sulfa Antibiotics Other (See Comments)    Makes patient jittery, prefers not to take them.     Past Medical History:  Diagnosis Date  . Anemia   . AS (aortic stenosis)    Mild  . Barrett's esophagus 11/11/2014  . CAD S/P percutaneous coronary angioplasty    7 stents per the patient (no record).  Stent to 90% inferior branch of an OM and mild scattered plaque in other vessels [facility]  01/08/13.  Cath 2016 with patent stents. He did have 70% stenosis in a small LAD that was about 2 mm. Size  . Coronary artery disease involving coronary bypass graft of native heart with angina pectoris (HCC) 06/27/2017  . Essential hypertension   . GERD (gastroesophageal reflux disease)   . Glaucoma   . Heart murmur   . History of hiatal hernia   . Hypercoagulable state (HCC)   . Hyperlipidemia   . Joint pain   . Lupus   . Peripheral vascular disease (HCC)    PE  dvt   hx 12-14 yrs  . Pneumonia    hx  . Pneumonitis    Lupus  . S/P aortic valve replacement with bioprosthetic valve 10/17/2017   25 mm Edwards Intuity Elite rapid-deployment stented bovine pericardial tissue valve  . S/P CABG x 4 10/17/2017   LIMA to LAD w/ endarterectomy of LAD, SVG to D1, SVG to OM, SVG to RCA, EVH via bilateral thighs  . Severe aortic stenosis   . Unstable angina (HCC) 09/27/2017  . Uses hearing aid     Social History   Socioeconomic History  . Marital status: Married    Spouse name: Not on file  . Number of children: 2  . Years of education: Not on file  . Highest education level: Not on file  Social Needs  . Financial resource strain: Not on file  . Food insecurity - worry: Not on file  . Food insecurity - inability: Not on file  . Transportation needs - medical: Not  on file  . Transportation needs - non-medical: Not on file  Occupational History  . Not on file  Tobacco Use  . Smoking status: Never Smoker  . Smokeless tobacco: Never Used  Substance and Sexual Activity  . Alcohol use: Yes  Comment: 2 beers month?  . Drug use: No  . Sexual activity: Not on file  Other Topics Concern  . Not on file  Social History Narrative   Married for 42 years   Has two grown children and three grandchildren.    Retired on disability   No longer have pets. Moved from a home to an apartment   Likes to read,workout, travel.      Family History  Problem Relation Age of Onset  . Diabetes Mother   . Hypertension Mother   . Arthritis Mother   . Diabetes Father   . Hypertension Father   . Arthritis Father   . CAD Father [id number]       died age 67 of MI     Review of Systems: General: negative for chills, fever, night sweats or weight changes.  Cardiovascular: negative for chest pain, dyspnea on exertion, edema, orthopnea, palpitations, paroxysmal nocturnal dyspnea or shortness of breath Dermatological: negative for rash Respiratory: negative for cough or wheezing Urologic: negative for hematuria Abdominal: negative for nausea, vomiting, diarrhea, bright red blood per rectum, melena, or hematemesis Neurologic: negative for visual changes, syncope, or dizziness All other systems reviewed and are otherwise negative except as noted above.    Blood pressure 122/74, pulse 68, height 5\' 8"  (1.727 m), weight 158 lb (71.7 kg).  General appearance: alert, cooperative, cachectic and no distress Neck: no carotid bruit and no JVD Lungs: decreased breath sounds at bases Heart: regular rate and rhythm and friction rub heard LSB Extremities: extremities normal, atraumatic, no cyanosis or edema Skin: pale cool dry Neurologic: Grossly normal   ASSESSMENT AND PLAN:   Slow to progress post CABG-AVR Suspect he has post op pericarditis  S/P aortic valve replacement  with bioprosthetic valve + CABG x4 10/17/17- 25 mm Edwards Intuity Elite rapid-deployment stented bovine pericardial tissue valve CABG x 4  Lupus anticoagulant with hypercoagulable state (HCC) Chronic Coumadin Rx  CAD S/P percutaneous coronary angioplasty: reports 7 stents History of multiple past PCI's   PLAN  Start 7 day dose pack. I encouraged him to continue to mobilize, start cardiac rehab in April as scheduled. F/U with Dr Antoine PocheHochrein in 6 weeks.   He is going for a dental cleaning in a few weeks and he asked about antibiotics pre cleaning. I'll discuss this with Dr Antoine PocheHochrein but it would appear he qualifies as "high risk" and would require antibiotics.   Corine ShelterLuke Ahniyah Giancola PA-C 12/19/2017 1:47 PM

## 2017-12-19 NOTE — Patient Instructions (Addendum)
Medication Instructions:  START Prednisolone TODAY take 40mg  (4 tabs)  40mg  (4 tabs) tomorrow Friday 12-20-2017 30mg  (3 tabs) Saturday 12-21-2017 30mg  (3tabs) Sunday 12-22-2017 20mg  (2 tabs) Monday 12-23-2017 20mg (2 tabs) Tuesday 12-24-2017 10mg  (1 tab) Wednesday 12-25-2017 10mg  (1 tab) Thursday 12-26-2017 5mg  (1/2 tab) Friday 12-27-2017 5mg  (1/2 tab) Saturday 12-28-2017 STOP on Sunday  Labwork: None   Testing/Procedures: None   Follow-Up: Your physician recommends that you schedule a follow-up appointment in: 6 weeks with Dr Antoine PocheHochrein  Any Other Special Instructions Will Be Listed Below (If Applicable).  If you need a refill on your cardiac medications before your next appointment, please call your pharmacy.

## 2017-12-19 NOTE — Progress Notes (Signed)
Prednisolone injection added to chart in error. TY, cma 3.7.2019 @ 2:03pm

## 2017-12-19 NOTE — Progress Notes (Signed)
I have reviewed and agree with this plan  

## 2017-12-19 NOTE — Addendum Note (Signed)
Addended by: Kandice RobinsonsYOUNG, Garris Melhorn T on: 12/19/2017 03:06 PM   Modules accepted: Orders

## 2017-12-20 NOTE — Progress Notes (Signed)
Please let Mr Barry DienesOwens know he will need SBE prophylaxis prior to dental work.  Corine ShelterLUKE Blakelee Allington PA-C 12/20/2017 10:44 AM

## 2017-12-26 ENCOUNTER — Telehealth: Payer: Self-pay

## 2017-12-26 MED ORDER — AMOXICILLIN 500 MG PO TABS: 2000.0000 mg | ORAL_TABLET | Freq: Once | ORAL | 0 refills | Status: AC

## 2017-12-26 NOTE — Telephone Encounter (Signed)
Per [name], patient needs SBE Prophylaxis. Called patient and informed him that he would have to take an antibiotic before his procedure due to his valve replacement. Rx sent to pharmacy Patient voiced understanding.

## 2018-01-02 ENCOUNTER — Other Ambulatory Visit: Payer: Self-pay | Admitting: Adult Health

## 2018-01-02 ENCOUNTER — Telehealth: Payer: Self-pay | Admitting: Cardiology

## 2018-01-02 NOTE — Telephone Encounter (Signed)
New Message   Pt c/o Shortness Of Breath: STAT if SOB developed within the last 24 hours or pt is noticeably SOB on the phone  1. Are you currently SOB (can you hear that pt is SOB on the phone)? No, does not sound short of breath   2. How long have you been experiencing SOB? 4 or 5 days   3. Are you SOB when sitting or when up moving around? Moving around or tying his shoes.  4. Are you currently experiencing any other symptoms? Some chest pains from time to time. He takes about 6 tylenol in a 24 hour period. Most of the time the pain is in the evening.

## 2018-01-02 NOTE — Telephone Encounter (Signed)
Spoke with patient and he has been having shortness of breath, chest and back pain. This is worse bending over and with exercise. He is taking Tylenol daily. He did take the Prednisone tapering dose as [facility]. He felt better but as he started to taper he started feeling bad. Scheduled appointment with Dr Antoine PocheHochrein for 3/26 Discussed with Dr Tresa EndoKelly DOD, continue Tylenol and keep appointment as scheduled. Advised patient of appointment and to go to ED if worse, verbalized understanding

## 2018-01-03 NOTE — Telephone Encounter (Signed)
PATIENT NEEDS AN APPOINTMENT FOR FURTHER REFILLS OF THIS MEDICATION.

## 2018-01-05 ENCOUNTER — Encounter: Payer: Self-pay | Admitting: Cardiology

## 2018-01-05 ENCOUNTER — Telehealth: Payer: Self-pay | Admitting: Cardiology

## 2018-01-05 NOTE — Telephone Encounter (Signed)
I called Mr [name] to see if the dose pack we prescribed helped at all. He indicated he felt OK for a couple days, then back to being weak. He feels he is not where he should be as far as endurance.  I also called to let him know his last BMP showed a K+ of 5.3. He says he only takes K+ supplement when he takes lasix, I suggested he stop taking K+. He'll need a BMP when he comes in 3/26.   Corine ShelterLUKE Dionisio Aragones PA-C 01/05/2018 8:45 AM

## 2018-01-05 NOTE — Progress Notes (Signed)
Cardiology Office Note   Date:  01/07/2018   ID:  [name], DOB March 17, 1951, MRN [id number]  PCP:  Dorothyann Peng, NP  Cardiologist:   Minus Breeding, MD   No chief complaint on file.     History of Present Illness: [name] is a 67 y.o. male who presents for follow up of CAD.   He had multiple stents in 2014 at [facility]. He moved to [place] after this and has been followed here since. He was seen in the office 09/27/17 with complaints of accelerating exertional angina. He also has known moderate to severe AS and Lupus anticoagulant with a history of PE and DVT. He is on chronic Coumadin. He was admitted for Rt and Lt heart cath 10/02/17. This revealed severe progression of CAD in all three major arteries and his OM (his previously placed pRCA, CFX x 2, OM1, and LAD stents were all patent). The pt was seen in consult by Dr Roxy Manns who recommended CABG and AVR. Unfortunately the pt developed a pseudoaneurysm post cath. He was seen on consult by Dr Donzetta Matters and this was injected by Dr Janeece Riggers 10/03/17.F/U doppler 2 hrs post confirmed successful closure.He was placed on Lovenox and discharge with plans to readmit him Jan 3'd for surgery. On 10/17/17 he underwent CABG x 4 with an LIMA-LAD, SVG-RCA, SVG-Dx1, and an SVG-OM. He also had a pericardial tissue AVR placed. He was discharged 10/22/17.  He has progressed very slowly.  At the follow up visit in the office he was placed on a steroid dose pack.  He said that he felt better during the dose pack peak.  However, he started feeling worse again when the dose came down.  He has pain and cannot lie completely back.  He has had soreness in his chest and back.  He has been SOB on level ground.  He has had weight loss not gain and improved foot edema.  He has had no chills, fevers or cough.   He feels worse now than he did a few weeks ago.  I do note that in Feb he had no effusion on echo.  He had an elevated ESR of 70 that did go down to  normal while he was on steroids.  Blood cultures were negative.    Past Medical History:  Diagnosis Date  . Anemia   . AS (aortic stenosis)    Mild  . Barrett's esophagus 11/11/2014  . CAD S/P percutaneous coronary angioplasty    7 stents per the patient (no record).  Stent to 90% inferior branch of an OM and mild scattered plaque in other vessels [facility]  01/08/13.  Cath 2016 with patent stents. He did have 70% stenosis in a small LAD that was about 2 mm. Size  . Coronary artery disease involving coronary bypass graft of native heart with angina pectoris ([place]) 06/27/2017  . Essential hypertension   . GERD (gastroesophageal reflux disease)   . Glaucoma   . History of hiatal hernia   . Hypercoagulable state ([place])   . Hyperlipidemia   . Lupus   . Peripheral vascular disease ([place])    PE  dvt   hx 12-14 yrs  . Pneumonia    hx  . Pneumonitis    Lupus  . S/P aortic valve replacement with bioprosthetic valve 10/17/2017   25 mm Edwards Intuity Elite rapid-deployment stented bovine pericardial tissue valve  . S/P CABG x 4 10/17/2017   LIMA to LAD w/  endarterectomy of LAD, SVG to D1, SVG to OM, SVG to RCA, EVH via bilateral thighs  . Severe aortic stenosis   . Uses hearing aid     Past Surgical History:  Procedure Laterality Date  . AORTIC VALVE REPLACEMENT N/A 10/17/2017   Procedure: AORTIC VALVE REPLACEMENT (AVR),CORONARY ARTERY BY-PASS GRAFTING,TEE;  Surgeon: Rexene Alberts, MD;  Location: [place];  Service: Open Heart Surgery;  Laterality: N/A;  . CARDIAC CATHETERIZATION N/A 04/01/2015   Procedure: Left Heart Cath and Coronary Angiography;  Surgeon: Leonie Man, MD;  Location: [place] CV [facility];  Service: Cardiovascular;  Laterality: N/A;  . CATARACT EXTRACTION     x 2  . COLONOSCOPY    . CORONARY ARTERY BYPASS GRAFT N/A 10/17/2017   Procedure: CORONARY ARTERY BYPASS GRAFTING (CABG) time four using right and left saphaneous vien, harvested endoscopicly. and Left internal mammary  artery. Coronary endartorectomy times one.;  Surgeon: Rexene Alberts, MD;  Location: [place];  Service: Open Heart Surgery;  Laterality: N/A;  . EYE SURGERY    . KNEE SURGERY Right    x2 tendon  . NASAL SINUS SURGERY    . TEE WITHOUT CARDIOVERSION N/A 10/17/2017   Procedure: TRANSESOPHAGEAL ECHOCARDIOGRAM (TEE);  Surgeon: Rexene Alberts, MD;  Location: [place];  Service: Open Heart Surgery;  Laterality: N/A;  . TONSILLECTOMY AND ADENOIDECTOMY    . UPPER GASTROINTESTINAL ENDOSCOPY       Current Outpatient Medications  Medication Sig Dispense Refill  . acetaminophen (TYLENOL) 325 MG tablet Take 2 tablets (650 mg total) by mouth every 4 (four) hours as needed for headache or mild pain.    Marland Kitchen aspirin 81 MG chewable tablet Chew 1 tablet (81 mg total) by mouth daily.    Marland Kitchen atorvastatin (LIPITOR) 80 MG tablet Take 1 tablet (80 mg total) by mouth daily at 6 PM. 90 tablet 3  . Azelastine HCl 0.15 % SOLN USE 2 SPRAYS BY NASAL ROUTE AS NEEDED FOR CONGESTION    . brimonidine (ALPHAGAN) 0.15 % ophthalmic solution Place 1 drop into both eyes 2 (two) times daily.    . calcium carbonate (TUMS - DOSED IN MG ELEMENTAL CALCIUM) 500 MG chewable tablet Chew 0.5 tablets by mouth as needed for indigestion or heartburn.    . Emollient (GOLD BOND ULTIMATE HEALING) CREA Apply 1 application topically daily as needed (wound care).    . Flaxseed, Linseed, (FLAX SEED OIL) 1000 MG CAPS Take 1,000 mg by mouth 2 (two) times daily.    Marland Kitchen loperamide (IMODIUM A-D) 2 MG tablet Take 2-4 mg by mouth 2 (two) times daily as needed for diarrhea or loose stools.    . metoprolol tartrate (LOPRESSOR) 25 MG tablet TAKE 0.5 TABLETS BY MOUTH 2 TIMES DAILY 60 tablet 12  . NASONEX 50 MCG/ACT nasal spray Place 2 sprays into the nose daily as needed (allergies).     . Omega-3 Fatty Acids (FISH OIL PO) Take 2 capsules by mouth daily.    . pantoprazole (PROTONIX) 40 MG tablet Take 1 tablet (40 mg total) by mouth daily. 90 tablet 3  . ramipril  (ALTACE) 5 MG capsule TAKE ONE CAPSULE BY MOUTH DAILY 90 capsule 2  . timolol (BETIMOL) 0.5 % ophthalmic solution Place 1 drop into both eyes 2 (two) times daily.    . traMADol (ULTRAM) 50 MG tablet Take 1 tablet (50 mg total) by mouth every 4 (four) hours as needed for moderate pain. 40 tablet 0  . Turmeric Curcumin 500 MG  CAPS Take 500 mg by mouth 2 (two) times daily.    Marland Kitchen warfarin (COUMADIN) 4 MG tablet Take 1 tablet (4 mg total) by mouth daily at 6 PM. 30 tablet 3  . colchicine 0.6 MG tablet Take 1 tablet (0.6 mg total) by mouth 2 (two) times daily for 14 days. 28 tablet 0  . nitroGLYCERIN (NITROSTAT) 0.4 MG SL tablet Place 1 tablet (0.4 mg total) under the tongue every 5 (five) minutes as needed for chest pain. 25 tablet 3  . predniSONE (DELTASONE) 10 MG tablet Take 6 tablets (60 mg total) by mouth daily with breakfast for 14 days, THEN 5 tablets (50 mg total) daily with breakfast for 14 days, THEN 4 tablets (40 mg total) daily with breakfast for 14 days, THEN 3 tablets (30 mg total) daily with breakfast for 14 days, THEN 2 tablets (20 mg total) daily with breakfast for 14 days, THEN 1 tablet (10 mg total) daily with breakfast for 14 days. 294 tablet 0   No current facility-administered medications for this visit.     Allergies:   Sulfa antibiotics    ROS:  Please see the history of present illness.   Otherwise, review of systems are positive for none.   All other systems are reviewed and negative.    PHYSICAL EXAM: VS:  BP 118/72   Pulse 88   Ht 5' 9"  (1.753 m)   Wt 161 lb 6.4 oz (73.2 kg)   BMI 23.83 kg/m  , BMI Body mass index is 23.83 kg/m. GENERAL:  Well appearing NECK:  No jugular venous distention, waveform within normal limits, carotid upstroke brisk and symmetric, no bruits, no thyromegaly LUNGS:  Clear to auscultation bilaterally BACK:  No CVA tenderness CHEST:  Well healed sternotomy scar. HEART:  PMI not displaced or sustained,S1 and S2 within normal limits, no S3, no  S4, no clicks, positive rub, soft systolic murmur ABD:  Flat, positive bowel sounds normal in frequency in pitch, no bruits, no rebound, no guarding, no midline pulsatile mass, no hepatomegaly, no splenomegaly EXT:  2 plus pulses throughout, trace edema, no cyanosis no clubbing    EKG:  EKG is ordered today. The ekg ordered today demonstrates NSR, LBBB rate 89.  No change from previous    Recent Labs: 10/16/2017: ALT 24 10/18/2017: Magnesium 2.0 10/23/2017: BUN 13; Creatinine, Ser 0.62; Potassium 3.8; Sodium 134 11/05/2017: Brain Natriuretic Peptide 300; Hemoglobin 8.6; Platelets 251    Lipid Panel    Component Value Date/Time   CHOL 76 10/04/2017 0306   TRIG 77 10/04/2017 0306   HDL 21 (L) 10/04/2017 0306   CHOLHDL 3.6 10/04/2017 0306   VLDL 15 10/04/2017 0306   LDLCALC 40 10/04/2017 0306      Wt Readings from Last 3 Encounters:  01/07/18 161 lb 6.4 oz (73.2 kg)  12/19/17 158 lb (71.7 kg)  12/10/17 156 lb (70.8 kg)      Other studies Reviewed: Additional studies/ records that were reviewed today include: Labs, echo. Review of the above records demonstrates:  Please see elsewhere in the note.     ASSESSMENT AND PLAN:   CAD/CABG:   He will continue with risk reduction.  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  I am going to try a moderate dose/long taper course of steroids.  (I will not at this point use NSAIDs as he has demonstrated that he already seems to have a somewhat refractory case. )  I will give colchicine in addition.  I will repeat his CRP which can be used to guide the taper.  I will also check a CBC and ESR.    I would like to refer him to endocrinology who might as he also might be having a flair of his lupus.  In addition, if we need therapy such as IV IG in the future they would be able to assist.  I will order a limited echo to make sure he has not developed an effusion.  I would like to him have close follow up in our clinic.  He will be scheduled  to come back in one month.    S/P aortic valve replacement with bioprosthetic valve:    10/17/17- 25 mm Edwards Intuity Elite rapid-deployment stented bovine pericardial tissue valve.  This was stable on follow up echo.   Lupus anticoagulant with hypercoagulable state ([place]):    Chronic Coumadin Rx  HTN: BP is well controlled.  Continue current therapy.  Dyslipidemia:     Current medicines are reviewed at length with the patient today.  The patient does not have concerns regarding medicines.  The following changes have been made:  As above  Labs/ tests ordered today include: As above  Orders Placed This Encounter  Procedures  . CBC  . Sed Rate (ESR)  . C-reactive protein  . Ambulatory referral to Rheumatology  . ECHOCARDIOGRAM COMPLETE     Disposition:   FU with Kerin Ransom, PAc in 1 month.     Signed, Minus Breeding, MD  01/07/2018 5:51 PM    [facility]

## 2018-01-07 ENCOUNTER — Ambulatory Visit: Payer: PPO | Admitting: Cardiology

## 2018-01-07 ENCOUNTER — Ambulatory Visit (INDEPENDENT_AMBULATORY_CARE_PROVIDER_SITE_OTHER): Payer: PPO | Admitting: Pharmacist

## 2018-01-07 ENCOUNTER — Encounter: Payer: Self-pay | Admitting: Cardiology

## 2018-01-07 VITALS — BP 118/72 | HR 88 | Ht 69.0 in | Wt 161.4 lb

## 2018-01-07 DIAGNOSIS — Z7901 Long term (current) use of anticoagulants: Secondary | ICD-10-CM

## 2018-01-07 DIAGNOSIS — I10 Essential (primary) hypertension: Secondary | ICD-10-CM

## 2018-01-07 DIAGNOSIS — E785 Hyperlipidemia, unspecified: Secondary | ICD-10-CM | POA: Diagnosis not present

## 2018-01-07 DIAGNOSIS — R0600 Dyspnea, unspecified: Secondary | ICD-10-CM

## 2018-01-07 DIAGNOSIS — R079 Chest pain, unspecified: Secondary | ICD-10-CM

## 2018-01-07 DIAGNOSIS — R012 Other cardiac sounds: Secondary | ICD-10-CM | POA: Insufficient documentation

## 2018-01-07 DIAGNOSIS — D6862 Lupus anticoagulant syndrome: Secondary | ICD-10-CM

## 2018-01-07 DIAGNOSIS — I313 Pericardial effusion (noninflammatory): Secondary | ICD-10-CM | POA: Diagnosis not present

## 2018-01-07 LAB — POCT INR: INR: 3.1

## 2018-01-07 MED ORDER — COLCHICINE 0.6 MG PO TABS: 0.6000 mg | ORAL_TABLET | Freq: Two times a day (BID) | ORAL | 0 refills | Status: DC

## 2018-01-07 MED ORDER — PREDNISONE 10 MG PO TABS: ORAL_TABLET | ORAL | 0 refills | Status: AC

## 2018-01-07 NOTE — Patient Instructions (Signed)
HOLD warfarin does today ONLY; then continue to take 2 mg daily except 4 mg on Monday and Tuesdays.  Re-check in 1 week at PCP.   Dosing instructions given to HeppnerPatty, RN @ [facility] [phone].

## 2018-01-07 NOTE — Patient Instructions (Addendum)
Medication Instructions: START Prednisone at breakfast after the 2 weeks of Colchicine: Please take 60 mg for two weeks, 50 mg for two weeks, 40 mg for two weeks, 30 mg for 2 weeks, 20 mg for two weeks, and 10 mg for two weeks. START Colchicine 0.6 mg twice daily for two weeks  If you need a refill on your cardiac medications before your next appointment, please call your pharmacy.   Labwork: Your physician recommends that you have a C reactive, Sed rate and a CBC today.  Procedures/Testing: Your physician has requested that you have an limited echocardiogram. Echocardiography is a painless test that uses sound waves to create images of your heart. It provides your doctor with information about the size and shape of your heart and how well your heart's chambers and valves are working. This procedure takes approximately one hour. There are no restrictions for this procedure. This will take place at [address], [address].  Follow-Up: Your physician wants you to follow-up within 2 weeks with [name], PA  Special Instructions: You have been referred to a Rheumatologist. They will call for an appointment   [facility] you for [facility]!!

## 2018-01-08 LAB — CBC
HEMOGLOBIN: 9.1 g/dL — AB (ref 13.0–17.7)
Hematocrit: 30.5 % — ABNORMAL LOW (ref 37.5–51.0)
MCH: 22.6 pg — ABNORMAL LOW (ref 26.6–33.0)
MCHC: 29.8 g/dL — ABNORMAL LOW (ref 31.5–35.7)
MCV: 76 fL — ABNORMAL LOW (ref 79–97)
Platelets: 195 10*3/uL (ref 150–379)
RBC: 4.03 x10E6/uL — AB (ref 4.14–5.80)
RDW: 17.6 % — ABNORMAL HIGH (ref 12.3–15.4)
WBC: 6.2 10*3/uL (ref 3.4–10.8)

## 2018-01-08 LAB — C-REACTIVE PROTEIN: CRP: 11 mg/L — AB (ref 0.0–4.9)

## 2018-01-08 LAB — SEDIMENTATION RATE: SED RATE: 81 mm/h — AB (ref 0–30)

## 2018-01-08 NOTE — Addendum Note (Signed)
Addended by: Neta EhlersRUITT, ANGELA M on: 01/08/2018 03:51 PM   Modules accepted: Orders

## 2018-01-13 ENCOUNTER — Ambulatory Visit: Payer: PPO

## 2018-01-15 ENCOUNTER — Other Ambulatory Visit: Payer: Self-pay

## 2018-01-15 ENCOUNTER — Telehealth: Payer: Self-pay | Admitting: Cardiology

## 2018-01-15 ENCOUNTER — Ambulatory Visit (HOSPITAL_COMMUNITY): Payer: PPO | Attending: Cardiovascular Disease

## 2018-01-15 ENCOUNTER — Other Ambulatory Visit: Payer: Self-pay | Admitting: Cardiology

## 2018-01-15 DIAGNOSIS — I081 Rheumatic disorders of both mitral and tricuspid valves: Secondary | ICD-10-CM | POA: Diagnosis not present

## 2018-01-15 DIAGNOSIS — I313 Pericardial effusion (noninflammatory): Secondary | ICD-10-CM | POA: Diagnosis not present

## 2018-01-15 DIAGNOSIS — R012 Other cardiac sounds: Secondary | ICD-10-CM

## 2018-01-15 DIAGNOSIS — R0600 Dyspnea, unspecified: Secondary | ICD-10-CM | POA: Diagnosis not present

## 2018-01-15 DIAGNOSIS — I10 Essential (primary) hypertension: Secondary | ICD-10-CM

## 2018-01-15 NOTE — Telephone Encounter (Signed)
Returned the call to the patient. He stated that his shortness of breath has not gotten any better since his office visit on 3/26. He was prescribed Colchicine which he has started but wants to know if he can start the Prednisone now and take it with the Colchicine. Message has been routed to the provider.  Patient was currently at his limited echo appointment. He has a follow up on 01/20/18

## 2018-01-15 NOTE — Telephone Encounter (Signed)
Pt c/o Shortness Of Breath: STAT if SOB developed within the last 24 hours or pt is noticeably SOB on the phone  1. Are you currently SOB (can you hear that pt is SOB on the phone)? Struggle when he breathe,hurting in his chest-talking to his wife 2. How long have you been experiencing SOB?since last week  3. Are you SOB when sitting or when up moving around? When he moves around 4.  Are you currently experiencing any other symptoms? Hurting in his chest,not chest pain

## 2018-01-16 ENCOUNTER — Telehealth: Payer: Self-pay | Admitting: Cardiology

## 2018-01-16 ENCOUNTER — Other Ambulatory Visit: Payer: Self-pay | Admitting: Physician Assistant

## 2018-01-16 NOTE — Telephone Encounter (Signed)
Returned call to patient's wife.She stated husband continues to have chest pain and sob.Stated he cannot walk from room to room without being sob.Stated he has took 9 days of colchicine that Dr.Hochrein prescribed when  finished he is suppose to start on prednisone dose pk.She wanted to ask if he can go ahead and start on prednisone.[facility] to [facility] he advised ok to start prednisone as prescribed.Advised to keep appointment with [name] PA Mon 4/8,.

## 2018-01-16 NOTE — Telephone Encounter (Signed)
F/U Call:  Patient wife returning call for an update. Patient wife states that the patient needs pain medicine. Patient is in "serious pain."

## 2018-01-16 NOTE — Telephone Encounter (Signed)
Called Dr. Fatima Sangereveshwar's office to find out about referral.  They had just recently gotten the referral on 01-08-18.  Dr. Corliss Skainseveshwar reviews the notes and then decides if she is going to take the patient.  I will contact their office next week and check again.

## 2018-01-17 ENCOUNTER — Other Ambulatory Visit: Payer: Self-pay | Admitting: Cardiology

## 2018-01-17 ENCOUNTER — Telehealth: Payer: Self-pay | Admitting: Cardiology

## 2018-01-17 NOTE — Telephone Encounter (Signed)
F/U call from note 01-15-18:   Patient wife calling back,  States that pharmacy did not receive prescription for Prednisone and she would like to have it filled before weekend

## 2018-01-17 NOTE — Telephone Encounter (Signed)
Spoke with patient's wife who states they received the Rx for prednisone as prescribed by Dr. Antoine PocheHochrein but she is calling for refill of tramadol. She states she mentioned this to MD at patient's last visit but I did not see this prescribed. Explained that [facility] prescribed this post-op and suggested she either call this office or PCP for request for pain medication. She states patient will be in pain over the weekend and she is frustrated that this is not being addressed. Apologized that I am unable to authorize a refill for pain medication without MD OK and he is out of the office today and did not originally prescribed this medication.   Routed to MD as Lorain ChildesFYI

## 2018-01-17 NOTE — Telephone Encounter (Signed)
I had intended for him to start the dose pack at the appointment.   Agree that he needs to start this.

## 2018-01-19 NOTE — Telephone Encounter (Signed)
OK to refill the tramadol.

## 2018-01-20 ENCOUNTER — Encounter: Payer: Self-pay | Admitting: Cardiology

## 2018-01-20 ENCOUNTER — Ambulatory Visit (INDEPENDENT_AMBULATORY_CARE_PROVIDER_SITE_OTHER): Payer: PPO | Admitting: Pharmacist Clinician (PhC)/ Clinical Pharmacy Specialist

## 2018-01-20 ENCOUNTER — Telehealth: Payer: Self-pay

## 2018-01-20 ENCOUNTER — Ambulatory Visit: Payer: PPO

## 2018-01-20 ENCOUNTER — Ambulatory Visit: Payer: PPO | Admitting: Cardiology

## 2018-01-20 VITALS — BP 115/68 | HR 72 | Ht 69.0 in | Wt 168.0 lb

## 2018-01-20 DIAGNOSIS — Z7901 Long term (current) use of anticoagulants: Secondary | ICD-10-CM

## 2018-01-20 DIAGNOSIS — D6862 Lupus anticoagulant syndrome: Secondary | ICD-10-CM

## 2018-01-20 DIAGNOSIS — I340 Nonrheumatic mitral (valve) insufficiency: Secondary | ICD-10-CM | POA: Diagnosis not present

## 2018-01-20 DIAGNOSIS — M3213 Lung involvement in systemic lupus erythematosus: Secondary | ICD-10-CM

## 2018-01-20 DIAGNOSIS — R079 Chest pain, unspecified: Secondary | ICD-10-CM | POA: Diagnosis not present

## 2018-01-20 DIAGNOSIS — I241 Dressler's syndrome: Secondary | ICD-10-CM | POA: Insufficient documentation

## 2018-01-20 LAB — POCT INR: INR: 3.6

## 2018-01-20 MED ORDER — COLCHICINE 0.6 MG PO TABS: 0.6000 mg | ORAL_TABLET | Freq: Every day | ORAL | 2 refills | Status: AC

## 2018-01-20 MED ORDER — IBUPROFEN 800 MG PO TABS: 800.0000 mg | ORAL_TABLET | Freq: Two times a day (BID) | ORAL | 0 refills | Status: AC

## 2018-01-20 MED ORDER — TRAMADOL HCL 50 MG PO TABS: 50.0000 mg | ORAL_TABLET | ORAL | 0 refills | Status: AC | PRN

## 2018-01-20 NOTE — Patient Instructions (Addendum)
Medication Instructions:  Start Colchicine 0.6mg  daily Ibuprofen 800 mg 2 times a day for 5 days ( take with food) Stop Atorvastatin(lipitor) 80 mg  Follow-Up: Your physician recommends that you schedule a follow-up appointment in 2 weeks with [name], PA  Any Other Special Instructions Will Be Listed Below (If Applicable).     If you need a refill on your cardiac medications before your next appointment, please call your pharmacy.

## 2018-01-20 NOTE — Assessment & Plan Note (Signed)
Pt with pain, SOB, slow to progress, and pericardial thickening on echo. Discussed with Dr Antoine PocheHochrein- plan longer steroid dose taper, add Ibuprofen 800 mg BID with food x 5 days, resume colchicine 0.6 mg daily.Early f/u, if no improvement  We may need to consider surgical consult for possible pericardiectomy.

## 2018-01-20 NOTE — Telephone Encounter (Signed)
Per Dr Jenene SlickerHochrein's request; Lavenia AtlasIve called Dr Fatima Sangereveshwar's office and requested the patient be scheduled an appointment with in the next 2-3 weeks. The secretary informed me that Dr Corliss Skainseveshwar reviews her own charts and does not do urgent appointments.  Dr Antoine PocheHochrein informed and voiced understanding.

## 2018-01-20 NOTE — Assessment & Plan Note (Signed)
10/17/17- 25 mm Edwards Intuity Elite rapid-deployment stented bovine pericardial tissue valve CABG x 4

## 2018-01-20 NOTE — Assessment & Plan Note (Signed)
On chronic Coumadin. 

## 2018-01-20 NOTE — Progress Notes (Signed)
01/20/2018 [name]   1951/07/05  [id number]  Primary Physician Nafziger, Kandee Keen, NP Primary Cardiologist: Dr Antoine Poche  HPI:  67 y/o male with a history of CAD, s/p multiple stents in 2014 at [facility]. He moved to [place] after this and has been followed here since. He was seen in the office 09/27/17 with complaints of accelerating exertional angina. He also has known moderate to severe AS and Lupus anticoagulant with a history of PE and DVT. He is on chronic Coumadin. He was evaluated and on 10/17/17 he underwent CABG x 4 with an LIMA-LAD, SVG-RCA, SVG-Dx1, and an SVG-OM. He also had a pericardial tissue AVR placed. He was discharged 10/22/17.   Since discharge he has been slow to progress. He has issues with pleuritic chest pain.  Echo, blood cultures, CXR, and WBC all were unremarkable. He was placed on a dose pack that helped some but his symptoms came back after this was stopped.  He saw Dr Antoine Poche 01/07/18 and was started on a longer taper dose pack. He was supposed to be taking colchicine as well but there was some misunderstanding in the instructions. We have also been trying to get him in to see a rheumatologist as his his rheumatologist retired. He is in the office seeing me today fo follow up. He still feels poorly. His wife says he has pain all the time. The pt describes mainly pleuritic type epigastric pain that can radiate to his back. He has no energy and has dyspnea walking across the room. 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     Current Outpatient Medications  Medication Sig Dispense Refill  . acetaminophen (TYLENOL) 325 MG tablet Take 2 tablets (650 mg total) by mouth every 4 (four) hours as needed for headache or mild pain.    Marland Kitchen aspirin 81 MG chewable tablet Chew 1 tablet (81 mg total) by mouth daily.    . Azelastine HCl 0.15 % SOLN USE 2 SPRAYS BY NASAL ROUTE AS NEEDED FOR CONGESTION     . brimonidine (ALPHAGAN) 0.15 % ophthalmic solution Place 1 drop into both eyes 2 (two) times daily.    . calcium carbonate (TUMS - DOSED IN MG ELEMENTAL CALCIUM) 500 MG chewable tablet Chew 0.5 tablets by mouth as needed for indigestion or heartburn.    . Emollient (GOLD BOND ULTIMATE HEALING) CREA Apply 1 application topically daily as needed (wound care).    . Flaxseed, Linseed, (FLAX SEED OIL) 1000 MG CAPS Take 1,000 mg by mouth 2 (two) times daily.    Marland Kitchen loperamide (IMODIUM A-D) 2 MG tablet Take 2-4 mg by mouth 2 (two) times daily as needed for diarrhea or loose stools.    . metoprolol tartrate (LOPRESSOR) 25 MG tablet TAKE 0.5 TABLETS BY MOUTH 2 TIMES DAILY 60 tablet 12  . NASONEX 50 MCG/ACT nasal spray Place 2 sprays into the nose daily as needed (allergies).     . Omega-3 Fatty Acids (FISH OIL PO) Take 2 capsules by mouth daily.    . pantoprazole (PROTONIX) 40 MG tablet Take 1 tablet (40 mg total) by mouth daily. 90 tablet 3  . predniSONE (DELTASONE) 10 MG tablet Take 6 tablets (60 mg total) by mouth daily with breakfast for 14 days, THEN 5 tablets (50 mg total) daily with breakfast for 14 days, THEN 4 tablets (40 mg total) daily with breakfast for 14 days, THEN 3 tablets (30 mg total) daily  with breakfast for 14 days, THEN 2 tablets (20 mg total) daily with breakfast for 14 days, THEN 1 tablet (10 mg total) daily with breakfast for 14 days. 294 tablet 0  . ramipril (ALTACE) 5 MG capsule TAKE ONE CAPSULE BY MOUTH DAILY 90 capsule 2  . timolol (BETIMOL) 0.5 % ophthalmic solution Place 1 drop into both eyes 2 (two) times daily.    . traMADol (ULTRAM) 50 MG tablet Take 1 tablet (50 mg total) by mouth every 4 (four) hours as needed for moderate pain. 30 tablet 0  . Turmeric Curcumin 500 MG CAPS Take 500 mg by mouth 2 (two) times daily.    Marland Kitchen. warfarin (COUMADIN) 4 MG tablet Take 1 tablet (4 mg total) by mouth daily at 6 PM. 30 tablet 3  . colchicine 0.6 MG tablet Take 1 tablet (0.6 mg total) by  mouth daily. 30 tablet 2  . ibuprofen (ADVIL,MOTRIN) 800 MG tablet Take 1 tablet (800 mg total) by mouth 2 (two) times daily for 5 days. 10 tablet 0  . nitroGLYCERIN (NITROSTAT) 0.4 MG SL tablet Place 1 tablet (0.4 mg total) under the tongue every 5 (five) minutes as needed for chest pain. 25 tablet 3   No current facility-administered medications for this visit.     Allergies  Allergen Reactions  . Sulfa Antibiotics Other (See Comments)    Makes patient jittery, prefers not to take them.     Past Medical History:  Diagnosis Date  . Anemia   . AS (aortic stenosis)    Mild  . Barrett's esophagus 11/11/2014  . CAD S/P percutaneous coronary angioplasty    7 stents per the patient (no record).  Stent to 90% inferior branch of an OM and mild scattered plaque in other vessels [facility]  [facility]08[facility].  Cath 2016 with patent stents. He did have 70% stenosis in a small LAD that was about 2 mm. Size  . Coronary artery disease involving coronary bypass graft of native heart with angina pectoris (HCC) 06/27/2017  . Essential hypertension   . GERD (gastroesophageal reflux disease)   . Glaucoma   . History of hiatal hernia   . Hypercoagulable state (HCC)   . Hyperlipidemia   . Lupus (HCC)   . Peripheral vascular disease (HCC)    PE  dvt   hx 12-14 yrs  . Pneumonia    hx  . Pneumonitis    Lupus  . S/P aortic valve replacement with bioprosthetic valve 10/17/2017   25 mm Edwards Intuity Elite rapid-deployment stented bovine pericardial tissue valve  . S/P CABG x 4 10/17/2017   LIMA to LAD w/ endarterectomy of LAD, SVG to D1, SVG to OM, SVG to RCA, EVH via bilateral thighs  . Severe aortic stenosis   . Uses hearing aid     Social History   Socioeconomic History  . Marital status: Married    Spouse name: Not on file  . Number of children: 2  . Years of education: Not on file  . Highest education level: Not on file  Occupational History  . Not on file  Social Needs  . Financial resource  strain: Not on file  . Food insecurity:    Worry: Not on file    Inability: Not on file  . Transportation needs:    Medical: Not on file    Non-medical: Not on file  Tobacco Use  . Smoking status: Never Smoker  . Smokeless tobacco: Never Used  Substance and Sexual Activity  .  Alcohol use: Yes    Comment: 2 beers month?  . Drug use: No  . Sexual activity: Not on file  Lifestyle  . Physical activity:    Days per week: Not on file    Minutes per session: Not on file  . Stress: Not on file  Relationships  . Social connections:    Talks on phone: Not on file    Gets together: Not on file    Attends religious service: Not on file    Active member of club or organization: Not on file    Attends meetings of clubs or organizations: Not on file    Relationship status: Not on file  . Intimate partner violence:    Fear of current or ex partner: Not on file    Emotionally abused: Not on file    Physically abused: Not on file    Forced sexual activity: Not on file  Other Topics Concern  . Not on file  Social History Narrative   Married for 42 years   Has two grown children and three grandchildren.    Retired on disability   No longer have pets. Moved from a home to an apartment   Likes to read,workout, travel.      Family History  Problem Relation Age of Onset  . Diabetes Mother   . Hypertension Mother   . Arthritis Mother   . Diabetes Father   . Hypertension Father   . Arthritis Father   . CAD Father 30       died age 87 of MI     Review of Systems: General: negative for chills, fever, night sweats or weight changes.  Cardiovascular: negative for chest pain, dyspnea on exertion, edema, orthopnea, palpitations, paroxysmal nocturnal dyspnea or shortness of breath Dermatological: negative for rash Respiratory: negative for cough or wheezing Urologic: negative for hematuria Abdominal: negative for nausea, vomiting, diarrhea, bright red blood per rectum, melena, or  hematemesis Neurologic: negative for visual changes, syncope, or dizziness All other systems reviewed and are otherwise negative except as noted above.    Blood pressure 115/68, pulse 72, height 5\' 9"  (1.753 m), weight 168 lb (76.2 kg), SpO2 97 %.  General appearance: alert, cooperative, appears older than stated age and no distress Neck: no carotid bruit and no JVD Lungs: decreased Rt base Heart: regular rate and rhythm and 2/6 MR murmur Extremities: trace edema Skin: pale cool dry Neurologic: Grossly normal  EKG NSR, 1st degree AVB, LBBB, PVC  ASSESSMENT AND PLAN:   Dressler's syndrome (HCC) Pt with pain, SOB, slow to progress, and pericardial thickening on echo. Discussed with Dr Antoine Poche- plan longer steroid dose taper, add Ibuprofen 800 mg BID with food x 5 days, resume colchicine 0.6 mg daily.Early f/u, if no improvement  We may need to consider surgical consult for possible pericardiectomy.   S/P aortic valve replacement with bioprosthetic valve + CABG x4 10/17/17- 25 mm Edwards Intuity Elite rapid-deployment stented bovine pericardial tissue valve CABG x 4  Lung involvement in systemic lupus erythematosus (HCC) Pt needs to see a rheumatologist- his retired  Lupus anticoagulant with hypercoagulable state (HCC) On chronic Coumadin  Moderate mitral regurgitation Seen on echo 01/15/18   PLAN  Discussed with Dr Antoine Poche- steroid taper (long), colchicine 0.6 mg, Ibuprofen 800 mg BID x 5 days. (hold Lipitor for now secondary to possible interaction with colchicine). We are trying to facilitate referral to Dr Titus Dubin for rheumatology evaluation. The pt will need early follow up in 2  weeks.   Corine Shelter PA-C 01/20/2018 11:59 AM

## 2018-01-20 NOTE — Assessment & Plan Note (Signed)
Seen on echo 01/15/18

## 2018-01-20 NOTE — Assessment & Plan Note (Signed)
Pt needs to see a rheumatologist- his retired

## 2018-01-21 ENCOUNTER — Telehealth (HOSPITAL_COMMUNITY): Payer: Self-pay | Admitting: Pharmacist

## 2018-01-22 ENCOUNTER — Telehealth: Payer: Self-pay | Admitting: Cardiology

## 2018-01-22 NOTE — Telephone Encounter (Signed)
Called Dr. Fatima Sangereveshwar's office to find out if patient has gotten an appointment yet.  LVM to call me back.

## 2018-01-22 NOTE — Telephone Encounter (Signed)
Kenneth NimrodAdrian from Dr. Fatima Sangereveshwar's office called and left a message stating that the doctor has not decided on taking [name]'s as a patient.

## 2018-01-23 ENCOUNTER — Ambulatory Visit: Payer: PPO | Admitting: Cardiology

## 2018-01-23 NOTE — Telephone Encounter (Signed)
Rx has been sent to the pharmacy electronically. Pt made aware medication was send into pharmacy

## 2018-01-23 NOTE — Telephone Encounter (Signed)
Cardiac Rehab Medication Review by a Pharmacist  Pharmacist comments: Patient self-report is that he is not doing very well clinically. Having trouble with myocardium tightening up and does not feel that he will be at cardiac rehab anytime soon. Did not want to go through medications because he thinks things will change prior to next visit. Advised patient to call me if there is anything that I can do for him in the future.   Donnella Biyler Laronda Lisby, PharmD PGY1 Acute Care Pharmacy Resident Pager: [phone] 01/23/2018 1:31 PM

## 2018-01-25 ENCOUNTER — Inpatient Hospital Stay (HOSPITAL_COMMUNITY): Payer: PPO

## 2018-01-25 ENCOUNTER — Encounter (HOSPITAL_COMMUNITY): Payer: Self-pay | Admitting: Emergency Medicine

## 2018-01-25 ENCOUNTER — Emergency Department (HOSPITAL_COMMUNITY): Payer: PPO | Admitting: Anesthesiology

## 2018-01-25 ENCOUNTER — Other Ambulatory Visit: Payer: Self-pay

## 2018-01-25 ENCOUNTER — Inpatient Hospital Stay (HOSPITAL_COMMUNITY): Admission: EM | Disposition: E | Payer: Self-pay | Source: Home / Self Care | Attending: Cardiology

## 2018-01-25 ENCOUNTER — Inpatient Hospital Stay (HOSPITAL_COMMUNITY)
Admission: EM | Admit: 2018-01-25 | Discharge: 2018-02-12 | DRG: 250 | Disposition: E | Payer: PPO | Attending: Cardiology | Admitting: Cardiology

## 2018-01-25 ENCOUNTER — Emergency Department (HOSPITAL_COMMUNITY): Payer: PPO

## 2018-01-25 DIAGNOSIS — H409 Unspecified glaucoma: Secondary | ICD-10-CM | POA: Diagnosis present

## 2018-01-25 DIAGNOSIS — Z882 Allergy status to sulfonamides status: Secondary | ICD-10-CM

## 2018-01-25 DIAGNOSIS — I5023 Acute on chronic systolic (congestive) heart failure: Secondary | ICD-10-CM | POA: Diagnosis present

## 2018-01-25 DIAGNOSIS — Z953 Presence of xenogenic heart valve: Secondary | ICD-10-CM | POA: Diagnosis not present

## 2018-01-25 DIAGNOSIS — J9601 Acute respiratory failure with hypoxia: Secondary | ICD-10-CM | POA: Diagnosis not present

## 2018-01-25 DIAGNOSIS — I110 Hypertensive heart disease with heart failure: Secondary | ICD-10-CM | POA: Diagnosis present

## 2018-01-25 DIAGNOSIS — K922 Gastrointestinal hemorrhage, unspecified: Secondary | ICD-10-CM | POA: Diagnosis not present

## 2018-01-25 DIAGNOSIS — I2510 Atherosclerotic heart disease of native coronary artery without angina pectoris: Secondary | ICD-10-CM | POA: Diagnosis not present

## 2018-01-25 DIAGNOSIS — Z9842 Cataract extraction status, left eye: Secondary | ICD-10-CM | POA: Diagnosis not present

## 2018-01-25 DIAGNOSIS — M329 Systemic lupus erythematosus, unspecified: Secondary | ICD-10-CM | POA: Diagnosis not present

## 2018-01-25 DIAGNOSIS — I241 Dressler's syndrome: Secondary | ICD-10-CM | POA: Diagnosis present

## 2018-01-25 DIAGNOSIS — I739 Peripheral vascular disease, unspecified: Secondary | ICD-10-CM | POA: Diagnosis present

## 2018-01-25 DIAGNOSIS — E872 Acidosis: Secondary | ICD-10-CM | POA: Diagnosis not present

## 2018-01-25 DIAGNOSIS — T380X5A Adverse effect of glucocorticoids and synthetic analogues, initial encounter: Secondary | ICD-10-CM | POA: Diagnosis present

## 2018-01-25 DIAGNOSIS — Z978 Presence of other specified devices: Secondary | ICD-10-CM | POA: Diagnosis not present

## 2018-01-25 DIAGNOSIS — J9600 Acute respiratory failure, unspecified whether with hypoxia or hypercapnia: Secondary | ICD-10-CM | POA: Diagnosis not present

## 2018-01-25 DIAGNOSIS — Z7901 Long term (current) use of anticoagulants: Secondary | ICD-10-CM | POA: Diagnosis not present

## 2018-01-25 DIAGNOSIS — Z8261 Family history of arthritis: Secondary | ICD-10-CM

## 2018-01-25 DIAGNOSIS — E785 Hyperlipidemia, unspecified: Secondary | ICD-10-CM | POA: Diagnosis present

## 2018-01-25 DIAGNOSIS — K2270 Barrett's esophagus without dysplasia: Secondary | ICD-10-CM | POA: Diagnosis not present

## 2018-01-25 DIAGNOSIS — D6862 Lupus anticoagulant syndrome: Secondary | ICD-10-CM

## 2018-01-25 DIAGNOSIS — I213 ST elevation (STEMI) myocardial infarction of unspecified site: Secondary | ICD-10-CM | POA: Diagnosis not present

## 2018-01-25 DIAGNOSIS — Z79899 Other long term (current) drug therapy: Secondary | ICD-10-CM

## 2018-01-25 DIAGNOSIS — Z86718 Personal history of other venous thrombosis and embolism: Secondary | ICD-10-CM

## 2018-01-25 DIAGNOSIS — I469 Cardiac arrest, cause unspecified: Secondary | ICD-10-CM | POA: Diagnosis not present

## 2018-01-25 DIAGNOSIS — M1990 Unspecified osteoarthritis, unspecified site: Secondary | ICD-10-CM | POA: Diagnosis not present

## 2018-01-25 DIAGNOSIS — I10 Essential (primary) hypertension: Secondary | ICD-10-CM

## 2018-01-25 DIAGNOSIS — D6861 Antiphospholipid syndrome: Secondary | ICD-10-CM | POA: Diagnosis not present

## 2018-01-25 DIAGNOSIS — Z9841 Cataract extraction status, right eye: Secondary | ICD-10-CM

## 2018-01-25 DIAGNOSIS — K219 Gastro-esophageal reflux disease without esophagitis: Secondary | ICD-10-CM | POA: Diagnosis not present

## 2018-01-25 DIAGNOSIS — I970 Postcardiotomy syndrome: Secondary | ICD-10-CM | POA: Diagnosis present

## 2018-01-25 DIAGNOSIS — R012 Other cardiac sounds: Secondary | ICD-10-CM | POA: Diagnosis not present

## 2018-01-25 DIAGNOSIS — I25810 Atherosclerosis of coronary artery bypass graft(s) without angina pectoris: Secondary | ICD-10-CM | POA: Diagnosis present

## 2018-01-25 DIAGNOSIS — I5043 Acute on chronic combined systolic (congestive) and diastolic (congestive) heart failure: Secondary | ICD-10-CM

## 2018-01-25 DIAGNOSIS — Z8701 Personal history of pneumonia (recurrent): Secondary | ICD-10-CM

## 2018-01-25 DIAGNOSIS — Z9861 Coronary angioplasty status: Secondary | ICD-10-CM | POA: Diagnosis not present

## 2018-01-25 DIAGNOSIS — Z8249 Family history of ischemic heart disease and other diseases of the circulatory system: Secondary | ICD-10-CM

## 2018-01-25 DIAGNOSIS — R739 Hyperglycemia, unspecified: Secondary | ICD-10-CM | POA: Diagnosis present

## 2018-01-25 DIAGNOSIS — Z7982 Long term (current) use of aspirin: Secondary | ICD-10-CM

## 2018-01-25 DIAGNOSIS — R0602 Shortness of breath: Secondary | ICD-10-CM | POA: Diagnosis not present

## 2018-01-25 DIAGNOSIS — Z974 Presence of external hearing-aid: Secondary | ICD-10-CM

## 2018-01-25 DIAGNOSIS — Z4682 Encounter for fitting and adjustment of non-vascular catheter: Secondary | ICD-10-CM | POA: Diagnosis not present

## 2018-01-25 DIAGNOSIS — Z4659 Encounter for fitting and adjustment of other gastrointestinal appliance and device: Secondary | ICD-10-CM

## 2018-01-25 DIAGNOSIS — I340 Nonrheumatic mitral (valve) insufficiency: Secondary | ICD-10-CM | POA: Diagnosis not present

## 2018-01-25 DIAGNOSIS — R0603 Acute respiratory distress: Secondary | ICD-10-CM | POA: Diagnosis not present

## 2018-01-25 DIAGNOSIS — Z955 Presence of coronary angioplasty implant and graft: Secondary | ICD-10-CM

## 2018-01-25 DIAGNOSIS — R339 Retention of urine, unspecified: Secondary | ICD-10-CM | POA: Diagnosis not present

## 2018-01-25 DIAGNOSIS — Z7952 Long term (current) use of systemic steroids: Secondary | ICD-10-CM

## 2018-01-25 DIAGNOSIS — M7989 Other specified soft tissue disorders: Secondary | ICD-10-CM | POA: Diagnosis not present

## 2018-01-25 DIAGNOSIS — Z86711 Personal history of pulmonary embolism: Secondary | ICD-10-CM

## 2018-01-25 DIAGNOSIS — I214 Non-ST elevation (NSTEMI) myocardial infarction: Secondary | ICD-10-CM | POA: Diagnosis not present

## 2018-01-25 DIAGNOSIS — R079 Chest pain, unspecified: Secondary | ICD-10-CM | POA: Diagnosis not present

## 2018-01-25 HISTORY — PX: CORONARY/GRAFT ACUTE MI REVASCULARIZATION: CATH118305

## 2018-01-25 HISTORY — PX: LEFT HEART CATH AND CORONARY ANGIOGRAPHY: CATH118249

## 2018-01-25 LAB — CBC
HCT: 33.4 % — ABNORMAL LOW (ref 39.0–52.0)
HCT: 33.2 % — ABNORMAL LOW (ref 39.0–52.0)
HEMATOCRIT: 33.7 % — AB (ref 39.0–52.0)
Hemoglobin: 10.1 g/dL — ABNORMAL LOW (ref 13.0–17.0)
Hemoglobin: 10.1 g/dL — ABNORMAL LOW (ref 13.0–17.0)
Hemoglobin: 10 g/dL — ABNORMAL LOW (ref 13.0–17.0)
MCH: 21.9 pg — AB (ref 26.0–34.0)
MCH: 21.8 pg — ABNORMAL LOW (ref 26.0–34.0)
MCH: 21.8 pg — ABNORMAL LOW (ref 26.0–34.0)
MCHC: 30 g/dL (ref 30.0–36.0)
MCHC: 30.2 g/dL (ref 30.0–36.0)
MCHC: 30.1 g/dL (ref 30.0–36.0)
MCV: 72.9 fL — AB (ref 78.0–100.0)
MCV: 72.1 fL — ABNORMAL LOW (ref 78.0–100.0)
MCV: 72.3 fL — ABNORMAL LOW (ref 78.0–100.0)
PLATELETS: 195 10*3/uL (ref 150–400)
PLATELETS: 205 10*3/uL (ref 150–400)
PLATELETS: 190 10*3/uL (ref 150–400)
RBC: 4.62 MIL/uL (ref 4.22–5.81)
RBC: 4.63 MIL/uL (ref 4.22–5.81)
RBC: 4.59 MIL/uL (ref 4.22–5.81)
RDW: 18.5 % — ABNORMAL HIGH (ref 11.5–15.5)
RDW: 18.4 % — AB (ref 11.5–15.5)
RDW: 18.4 % — AB (ref 11.5–15.5)
WBC: 14.7 10*3/uL — ABNORMAL HIGH (ref 4.0–10.5)
WBC: 11.7 10*3/uL — AB (ref 4.0–10.5)
WBC: 11.9 10*3/uL — AB (ref 4.0–10.5)

## 2018-01-25 LAB — POCT I-STAT 3, ART BLOOD GAS (G3+)
ACID-BASE DEFICIT: 7 mmol/L — AB (ref 0.0–2.0)
ACID-BASE EXCESS: 3 mmol/L — AB (ref 0.0–2.0)
Acid-Base Excess: 2 mmol/L (ref 0.0–2.0)
BICARBONATE: 26.6 mmol/L (ref 20.0–28.0)
Bicarbonate: 19.2 mmol/L — ABNORMAL LOW (ref 20.0–28.0)
Bicarbonate: 27 mmol/L (ref 20.0–28.0)
O2 SAT: 100 %
O2 Saturation: 100 %
O2 Saturation: 100 %
PCO2 ART: 41.8 mmHg (ref 32.0–48.0)
PH ART: 7.417 (ref 7.350–7.450)
PO2 ART: 202 mmHg — AB (ref 83.0–108.0)
Patient temperature: 97.8
TCO2: 20 mmol/L — ABNORMAL LOW (ref 22–32)
TCO2: 28 mmol/L (ref 22–32)
TCO2: 28 mmol/L (ref 22–32)
pCO2 arterial: 37.3 mmHg (ref 32.0–48.0)
pCO2 arterial: 39.2 mmHg (ref 32.0–48.0)
pH, Arterial: 7.298 — ABNORMAL LOW (ref 7.350–7.450)
pH, Arterial: 7.461 — ABNORMAL HIGH (ref 7.350–7.450)
pO2, Arterial: 236 mmHg — ABNORMAL HIGH (ref 83.0–108.0)
pO2, Arterial: 432 mmHg — ABNORMAL HIGH (ref 83.0–108.0)

## 2018-01-25 LAB — URINALYSIS, ROUTINE W REFLEX MICROSCOPIC
BILIRUBIN URINE: NEGATIVE
GLUCOSE, UA: NEGATIVE mg/dL
KETONES UR: NEGATIVE mg/dL
LEUKOCYTES UA: NEGATIVE
Nitrite: NEGATIVE
PH: 5 (ref 5.0–8.0)
Protein, ur: NEGATIVE mg/dL
Specific Gravity, Urine: 1.016 (ref 1.005–1.030)
Squamous Epithelial / LPF: NONE SEEN

## 2018-01-25 LAB — BASIC METABOLIC PANEL
ANION GAP: 12 (ref 5–15)
Anion gap: 11 (ref 5–15)
Anion gap: 13 (ref 5–15)
BUN: 26 mg/dL — AB (ref 6–20)
BUN: 20 mg/dL (ref 6–20)
BUN: 21 mg/dL — AB (ref 6–20)
CALCIUM: 8.2 mg/dL — AB (ref 8.9–10.3)
CHLORIDE: 100 mmol/L — AB (ref 101–111)
CHLORIDE: 97 mmol/L — AB (ref 101–111)
CO2: 23 mmol/L (ref 22–32)
CO2: 24 mmol/L (ref 22–32)
CO2: 26 mmol/L (ref 22–32)
CREATININE: 0.99 mg/dL (ref 0.61–1.24)
CREATININE: 0.72 mg/dL (ref 0.61–1.24)
Calcium: 8.7 mg/dL — ABNORMAL LOW (ref 8.9–10.3)
Calcium: 8 mg/dL — ABNORMAL LOW (ref 8.9–10.3)
Chloride: 98 mmol/L — ABNORMAL LOW (ref 101–111)
Creatinine, Ser: 0.6 mg/dL — ABNORMAL LOW (ref 0.61–1.24)
GFR calc Af Amer: >60 mL/min (ref >60)
GFR calc Af Amer: >60 mL/min (ref >60)
GFR calc Af Amer: >60 mL/min (ref >60)
GFR calc non Af Amer: >60 mL/min (ref >60)
GLUCOSE: 110 mg/dL — AB (ref 65–99)
GLUCOSE: 107 mg/dL — AB (ref 65–99)
Glucose, Bld: 103 mg/dL — ABNORMAL HIGH (ref 65–99)
POTASSIUM: 3.7 mmol/L (ref 3.5–5.1)
Potassium: 3.6 mmol/L (ref 3.5–5.1)
Potassium: 4 mmol/L (ref 3.5–5.1)
SODIUM: 134 mmol/L — AB (ref 135–145)
Sodium: 134 mmol/L — ABNORMAL LOW (ref 135–145)
Sodium: 136 mmol/L (ref 135–145)

## 2018-01-25 LAB — TYPE AND SCREEN
ABO/RH(D): B POS
Antibody Screen: NEGATIVE

## 2018-01-25 LAB — LIPID PANEL
CHOL/HDL RATIO: 3.5 ratio
CHOLESTEROL: 76 mg/dL (ref 0–200)
HDL: 22 mg/dL — AB (ref >40)
LDL CALC: 38 mg/dL (ref 0–99)
TRIGLYCERIDES: 82 mg/dL (ref <150)
VLDL: 16 mg/dL (ref 0–40)

## 2018-01-25 LAB — SEDIMENTATION RATE: SED RATE: 12 mm/h (ref 0–16)

## 2018-01-25 LAB — TROPONIN I
TROPONIN I: 7.46 ng/mL — AB (ref <0.03)
Troponin I: 1.04 ng/mL (ref <0.03)
Troponin I: 5.66 ng/mL (ref <0.03)
Troponin I: 9.93 ng/mL (ref <0.03)

## 2018-01-25 LAB — BRAIN NATRIURETIC PEPTIDE
B Natriuretic Peptide: 1002.9 pg/mL — ABNORMAL HIGH (ref 0.0–100.0)
B Natriuretic Peptide: 1288 pg/mL — ABNORMAL HIGH (ref 0.0–100.0)

## 2018-01-25 LAB — C-REACTIVE PROTEIN: CRP: <0.8 mg/dL (ref <1.0)

## 2018-01-25 LAB — GLUCOSE, CAPILLARY
GLUCOSE-CAPILLARY: 106 mg/dL — AB (ref 65–99)
GLUCOSE-CAPILLARY: 66 mg/dL (ref 65–99)
Glucose-Capillary: 81 mg/dL (ref 65–99)
Glucose-Capillary: 92 mg/dL (ref 65–99)

## 2018-01-25 LAB — I-STAT TROPONIN, ED: TROPONIN I, POC: 1.18 ng/mL — AB (ref 0.00–0.08)

## 2018-01-25 LAB — APTT: APTT: 34 s (ref 24–36)

## 2018-01-25 LAB — CBG MONITORING, ED: GLUCOSE-CAPILLARY: 95 mg/dL (ref 65–99)

## 2018-01-25 LAB — PROTIME-INR
INR: 3.56
Prothrombin Time: 35.4 seconds — ABNORMAL HIGH (ref 11.4–15.2)

## 2018-01-25 LAB — POCT ACTIVATED CLOTTING TIME
Activated Clotting Time: 268 seconds
Activated Clotting Time: 285 seconds

## 2018-01-25 LAB — PROCALCITONIN

## 2018-01-25 LAB — MRSA PCR SCREENING: MRSA BY PCR: NEGATIVE

## 2018-01-25 SURGERY — CORONARY/GRAFT ACUTE MI REVASCULARIZATION
Anesthesia: LOCAL

## 2018-01-25 MED ORDER — NITROGLYCERIN 0.4 MG SL SUBL: 0.4000 mg | SUBLINGUAL_TABLET | SUBLINGUAL | Status: DC | PRN

## 2018-01-25 MED ORDER — LIDOCAINE HCL (PF) 1 % IJ SOLN
INTRAMUSCULAR | Status: AC
  Filled 2018-01-25: qty 30

## 2018-01-25 MED ORDER — VERAPAMIL HCL 2.5 MG/ML IV SOLN
INTRAVENOUS | Status: AC
  Filled 2018-01-25: qty 2

## 2018-01-25 MED ORDER — FUROSEMIDE 10 MG/ML IJ SOLN
INTRAMUSCULAR | Status: AC
  Filled 2018-01-25: qty 4

## 2018-01-25 MED ORDER — LABETALOL HCL 5 MG/ML IV SOLN: 10.0000 mg | INTRAVENOUS | Status: AC | PRN

## 2018-01-25 MED ORDER — PREDNISONE 50 MG PO TABS
50.0000 mg | ORAL_TABLET | Freq: Every day | ORAL | Status: DC
  Administered 2018-01-25 – 2018-01-26 (×2): 50 mg via ORAL
  Filled 2018-01-25 (×2): qty 1

## 2018-01-25 MED ORDER — SODIUM CHLORIDE 0.9 % IV SOLN: 250.0000 mL | INTRAVENOUS | Status: DC | PRN

## 2018-01-25 MED ORDER — PANTOPRAZOLE SODIUM 40 MG IV SOLR
40.0000 mg | Freq: Two times a day (BID) | INTRAVENOUS | Status: DC
  Filled 2018-01-25: qty 40

## 2018-01-25 MED ORDER — FUROSEMIDE 10 MG/ML IJ SOLN
60.0000 mg | Freq: Two times a day (BID) | INTRAMUSCULAR | Status: DC
  Administered 2018-01-25 – 2018-01-26 (×3): 60 mg via INTRAVENOUS
  Filled 2018-01-25 (×3): qty 6

## 2018-01-25 MED ORDER — SUCCINYLCHOLINE CHLORIDE 20 MG/ML IJ SOLN
INTRAMUSCULAR | Status: DC | PRN
  Administered 2018-01-25: 100 mg via INTRAVENOUS

## 2018-01-25 MED ORDER — MORPHINE SULFATE (PF) 10 MG/ML IV SOLN: 2.0000 mg | INTRAVENOUS | Status: DC | PRN

## 2018-01-25 MED ORDER — HEPARIN SODIUM (PORCINE) 1000 UNIT/ML IJ SOLN
INTRAMUSCULAR | Status: AC
  Filled 2018-01-25: qty 1

## 2018-01-25 MED ORDER — FENTANYL BOLUS VIA INFUSION
25.0000 ug | INTRAVENOUS | Status: DC | PRN
  Filled 2018-01-25: qty 25

## 2018-01-25 MED ORDER — IBUPROFEN 200 MG PO TABS: 800.0000 mg | ORAL_TABLET | Freq: Two times a day (BID) | ORAL | Status: DC

## 2018-01-25 MED ORDER — PREDNISONE 10 MG PO TABS: 10.0000 mg | ORAL_TABLET | Freq: Every day | ORAL | Status: DC

## 2018-01-25 MED ORDER — BRIMONIDINE TARTRATE 0.15 % OP SOLN
1.0000 [drp] | Freq: Two times a day (BID) | OPHTHALMIC | Status: DC
  Administered 2018-01-25 – 2018-01-26 (×2): 1 [drp] via OPHTHALMIC
  Filled 2018-01-25: qty 5

## 2018-01-25 MED ORDER — FENTANYL CITRATE (PF) 100 MCG/2ML IJ SOLN: 50.0000 ug | INTRAMUSCULAR | Status: DC | PRN

## 2018-01-25 MED ORDER — ETOMIDATE 2 MG/ML IV SOLN
INTRAVENOUS | Status: DC | PRN
  Administered 2018-01-25: 12 mg via INTRAVENOUS

## 2018-01-25 MED ORDER — ASPIRIN 325 MG PO TABS: 650.0000 mg | ORAL_TABLET | Freq: Three times a day (TID) | ORAL | Status: DC

## 2018-01-25 MED ORDER — PANTOPRAZOLE SODIUM 40 MG IV SOLR
40.0000 mg | Freq: Every day | INTRAVENOUS | Status: DC
  Administered 2018-01-25: 40 mg via INTRAVENOUS
  Filled 2018-01-25: qty 40

## 2018-01-25 MED ORDER — CARVEDILOL 3.125 MG PO TABS
3.1250 mg | ORAL_TABLET | Freq: Two times a day (BID) | ORAL | Status: DC
  Administered 2018-01-25 – 2018-01-26 (×2): 3.125 mg via ORAL
  Filled 2018-01-25 (×2): qty 1

## 2018-01-25 MED ORDER — COLCHICINE 0.6 MG PO TABS
0.6000 mg | ORAL_TABLET | Freq: Every day | ORAL | Status: DC
  Administered 2018-01-25 – 2018-01-26 (×2): 0.6 mg via ORAL
  Filled 2018-01-25 (×2): qty 1

## 2018-01-25 MED ORDER — POTASSIUM CHLORIDE CRYS ER 20 MEQ PO TBCR: 40.0000 meq | EXTENDED_RELEASE_TABLET | Freq: Once | ORAL | Status: DC

## 2018-01-25 MED ORDER — LOPERAMIDE HCL 2 MG PO CAPS: 2.0000 mg | ORAL_CAPSULE | Freq: Two times a day (BID) | ORAL | Status: DC | PRN

## 2018-01-25 MED ORDER — NITROGLYCERIN 0.4 MG SL SUBL
0.4000 mg | SUBLINGUAL_TABLET | SUBLINGUAL | Status: DC | PRN
  Administered 2018-01-25 – 2018-01-26 (×2): 0.4 mg via SUBLINGUAL
  Filled 2018-01-25: qty 1

## 2018-01-25 MED ORDER — SODIUM CHLORIDE 0.9 % IV SOLN
INTRAVENOUS | Status: AC
  Administered 2018-01-25: 08:00:00 via INTRAVENOUS

## 2018-01-25 MED ORDER — ACETAMINOPHEN 325 MG PO TABS: 650.0000 mg | ORAL_TABLET | ORAL | Status: DC | PRN

## 2018-01-25 MED ORDER — ASPIRIN 81 MG PO CHEW: 324.0000 mg | CHEWABLE_TABLET | Freq: Once | ORAL | Status: AC

## 2018-01-25 MED ORDER — TICAGRELOR 90 MG PO TABS
ORAL_TABLET | ORAL | Status: DC | PRN
  Administered 2018-01-25: 180 mg via ORAL

## 2018-01-25 MED ORDER — DEXTROSE 5 % IV SOLN
INTRAVENOUS | Status: DC
  Administered 2018-01-25: 18:00:00 via INTRAVENOUS

## 2018-01-25 MED ORDER — FENTANYL CITRATE (PF) 100 MCG/2ML IJ SOLN
50.0000 ug | Freq: Once | INTRAMUSCULAR | Status: AC
  Administered 2018-01-25: 50 ug via INTRAVENOUS

## 2018-01-25 MED ORDER — HEPARIN SODIUM (PORCINE) 1000 UNIT/ML IJ SOLN
INTRAMUSCULAR | Status: DC | PRN
  Administered 2018-01-25: 4000 [IU] via INTRAVENOUS
  Administered 2018-01-25: 2000 [IU] via INTRAVENOUS

## 2018-01-25 MED ORDER — HEPARIN (PORCINE) IN NACL 2-0.9 UNIT/ML-% IJ SOLN
INTRAMUSCULAR | Status: AC
  Filled 2018-01-25: qty 1000

## 2018-01-25 MED ORDER — CHLORHEXIDINE GLUCONATE 0.12% ORAL RINSE (MEDLINE KIT)
15.0000 mL | Freq: Two times a day (BID) | OROMUCOSAL | Status: DC
  Administered 2018-01-25 – 2018-01-26 (×3): 15 mL via OROMUCOSAL

## 2018-01-25 MED ORDER — POTASSIUM CHLORIDE 20 MEQ PO PACK
40.0000 meq | PACK | Freq: Once | ORAL | Status: AC
  Administered 2018-01-25: 40 meq
  Filled 2018-01-25: qty 2

## 2018-01-25 MED ORDER — LIDOCAINE HCL (PF) 1 % IJ SOLN
INTRAMUSCULAR | Status: DC | PRN
  Administered 2018-01-25: 23 mL

## 2018-01-25 MED ORDER — TIMOLOL MALEATE 0.5 % OP SOLN
1.0000 [drp] | Freq: Two times a day (BID) | OPHTHALMIC | Status: DC
  Administered 2018-01-25 – 2018-01-26 (×2): 1 [drp] via OPHTHALMIC
  Filled 2018-01-25: qty 5

## 2018-01-25 MED ORDER — IOPAMIDOL (ISOVUE-370) INJECTION 76%
INTRAVENOUS | Status: AC
  Filled 2018-01-25: qty 125

## 2018-01-25 MED ORDER — DOCUSATE SODIUM 50 MG/5ML PO LIQD
100.0000 mg | Freq: Two times a day (BID) | ORAL | Status: DC | PRN
  Filled 2018-01-25: qty 10

## 2018-01-25 MED ORDER — PREDNISONE 50 MG PO TABS: 50.0000 mg | ORAL_TABLET | Freq: Every day | ORAL | Status: DC

## 2018-01-25 MED ORDER — FUROSEMIDE 10 MG/ML IJ SOLN
INTRAMUSCULAR | Status: AC
  Filled 2018-01-25: qty 8

## 2018-01-25 MED ORDER — NITROGLYCERIN 1 MG/10 ML FOR IR/CATH LAB
INTRA_ARTERIAL | Status: AC
  Filled 2018-01-25: qty 10

## 2018-01-25 MED ORDER — PANTOPRAZOLE SODIUM 40 MG PO TBEC: 40.0000 mg | DELAYED_RELEASE_TABLET | Freq: Every day | ORAL | Status: DC

## 2018-01-25 MED ORDER — INSULIN ASPART 100 UNIT/ML ~~LOC~~ SOLN
1.0000 [IU] | SUBCUTANEOUS | Status: DC
  Administered 2018-01-26: 1 [IU] via SUBCUTANEOUS
  Filled 2018-01-25: qty 0.03

## 2018-01-25 MED ORDER — FENTANYL CITRATE (PF) 100 MCG/2ML IJ SOLN
INTRAMUSCULAR | Status: AC
  Filled 2018-01-25: qty 2

## 2018-01-25 MED ORDER — PREDNISONE 20 MG PO TABS: 30.0000 mg | ORAL_TABLET | Freq: Every day | ORAL | Status: DC

## 2018-01-25 MED ORDER — SODIUM CHLORIDE 0.9 % IV SOLN
INTRAVENOUS | Status: DC
  Administered 2018-01-25: 02:00:00 via INTRAVENOUS

## 2018-01-25 MED ORDER — PREDNISONE 20 MG PO TABS: 20.0000 mg | ORAL_TABLET | Freq: Every day | ORAL | Status: DC

## 2018-01-25 MED ORDER — SODIUM CHLORIDE 0.9 % IV SOLN: 25.0000 ug/h | INTRAVENOUS | Status: DC

## 2018-01-25 MED ORDER — SODIUM CHLORIDE 0.9% FLUSH: 3.0000 mL | INTRAVENOUS | Status: DC | PRN

## 2018-01-25 MED ORDER — SODIUM CHLORIDE 0.9 % IV BOLUS
250.0000 mL | Freq: Once | INTRAVENOUS | Status: AC
  Administered 2018-01-25: 250 mL via INTRAVENOUS

## 2018-01-25 MED ORDER — IOHEXOL 350 MG/ML SOLN
INTRAVENOUS | Status: DC | PRN
  Administered 2018-01-25: 80 mL via INTRA_ARTERIAL

## 2018-01-25 MED ORDER — PANTOPRAZOLE SODIUM 40 MG IV SOLR
40.0000 mg | Freq: Two times a day (BID) | INTRAVENOUS | Status: DC
  Administered 2018-01-25 – 2018-01-26 (×2): 40 mg via INTRAVENOUS
  Filled 2018-01-25 (×2): qty 40

## 2018-01-25 MED ORDER — PREDNISONE 20 MG PO TABS: 40.0000 mg | ORAL_TABLET | Freq: Every day | ORAL | Status: DC

## 2018-01-25 MED ORDER — FENTANYL 2500MCG IN NS 250ML (10MCG/ML) PREMIX INFUSION
0.0000 ug/h | INTRAVENOUS | Status: DC
  Administered 2018-01-25 (×2): 25 ug/h via INTRAVENOUS
  Filled 2018-01-25: qty 250

## 2018-01-25 MED ORDER — ONDANSETRON HCL 4 MG/2ML IJ SOLN
4.0000 mg | Freq: Four times a day (QID) | INTRAMUSCULAR | Status: DC | PRN
  Administered 2018-01-26: 4 mg via INTRAVENOUS
  Filled 2018-01-25: qty 2

## 2018-01-25 MED ORDER — FENTANYL CITRATE (PF) 100 MCG/2ML IJ SOLN
INTRAMUSCULAR | Status: DC | PRN
  Administered 2018-01-25: 50 ug via INTRAVENOUS
  Administered 2018-01-25 (×2): 25 ug via INTRAVENOUS

## 2018-01-25 MED ORDER — SODIUM BICARBONATE 8.4 % IV SOLN
INTRAVENOUS | Status: DC | PRN
  Administered 2018-01-25: 50 meq via INTRAVENOUS

## 2018-01-25 MED ORDER — NITROGLYCERIN 0.4 MG SL SUBL
SUBLINGUAL_TABLET | SUBLINGUAL | Status: AC
  Filled 2018-01-25: qty 1

## 2018-01-25 MED ORDER — IPRATROPIUM-ALBUTEROL 0.5-2.5 (3) MG/3ML IN SOLN
3.0000 mL | Freq: Four times a day (QID) | RESPIRATORY_TRACT | Status: DC | PRN
  Filled 2018-01-25: qty 3

## 2018-01-25 MED ORDER — MIDAZOLAM HCL 2 MG/2ML IJ SOLN
1.0000 mg | INTRAMUSCULAR | Status: DC | PRN
  Administered 2018-01-25: 1 mg via INTRAVENOUS
  Filled 2018-01-25: qty 2

## 2018-01-25 MED ORDER — MIDAZOLAM HCL 2 MG/2ML IJ SOLN
INTRAMUSCULAR | Status: DC | PRN
  Administered 2018-01-25 (×2): 1 mg via INTRAVENOUS

## 2018-01-25 MED ORDER — TICAGRELOR 90 MG PO TABS
ORAL_TABLET | ORAL | Status: AC
  Filled 2018-01-25: qty 2

## 2018-01-25 MED ORDER — FUROSEMIDE 10 MG/ML IJ SOLN
80.0000 mg | Freq: Once | INTRAMUSCULAR | Status: AC
  Administered 2018-01-25: 80 mg via INTRAVENOUS

## 2018-01-25 MED ORDER — HEPARIN SODIUM (PORCINE) 5000 UNIT/ML IJ SOLN
4000.0000 [IU] | Freq: Once | INTRAMUSCULAR | Status: AC
  Administered 2018-01-25: 4000 [IU] via INTRAVENOUS
  Filled 2018-01-25: qty 1

## 2018-01-25 MED ORDER — HEPARIN (PORCINE) IN NACL 2-0.9 UNIT/ML-% IJ SOLN
INTRAMUSCULAR | Status: AC | PRN
  Administered 2018-01-25 (×2): 500 mL via INTRA_ARTERIAL

## 2018-01-25 MED ORDER — RAMIPRIL 5 MG PO CAPS
5.0000 mg | ORAL_CAPSULE | Freq: Every day | ORAL | Status: DC
  Administered 2018-01-25 – 2018-01-26 (×2): 5 mg via ORAL
  Filled 2018-01-25 (×2): qty 1

## 2018-01-25 MED ORDER — FLUTICASONE PROPIONATE 50 MCG/ACT NA SUSP
2.0000 | Freq: Every day | NASAL | Status: DC
  Administered 2018-01-25 – 2018-01-26 (×2): 2 via NASAL
  Filled 2018-01-25: qty 16

## 2018-01-25 MED ORDER — MIDAZOLAM HCL 2 MG/2ML IJ SOLN
1.0000 mg | INTRAMUSCULAR | Status: DC | PRN
  Administered 2018-01-25 (×2): 1 mg via INTRAVENOUS
  Filled 2018-01-25: qty 2

## 2018-01-25 MED ORDER — SUCRALFATE 1 GM/10ML PO SUSP
1.0000 g | Freq: Three times a day (TID) | ORAL | Status: DC
  Administered 2018-01-25 – 2018-01-26 (×3): 1 g via ORAL
  Filled 2018-01-25 (×7): qty 10

## 2018-01-25 MED ORDER — FUROSEMIDE 10 MG/ML IJ SOLN
INTRAMUSCULAR | Status: DC | PRN
  Administered 2018-01-25: 40 mg via INTRAVENOUS

## 2018-01-25 MED ORDER — MORPHINE SULFATE (PF) 4 MG/ML IV SOLN
4.0000 mg | Freq: Once | INTRAVENOUS | Status: AC
  Administered 2018-01-25: 4 mg via INTRAVENOUS
  Filled 2018-01-25: qty 1

## 2018-01-25 MED ORDER — ATORVASTATIN CALCIUM 80 MG PO TABS
80.0000 mg | ORAL_TABLET | Freq: Every day | ORAL | Status: DC
  Administered 2018-01-25: 80 mg via ORAL
  Filled 2018-01-25: qty 1

## 2018-01-25 MED ORDER — ORAL CARE MOUTH RINSE
15.0000 mL | Freq: Four times a day (QID) | OROMUCOSAL | Status: DC
  Administered 2018-01-25 – 2018-01-26 (×4): 15 mL via OROMUCOSAL

## 2018-01-25 MED ORDER — VERAPAMIL HCL 2.5 MG/ML IV SOLN
INTRAVENOUS | Status: DC | PRN
  Administered 2018-01-25 (×2): 100 ug via INTRACORONARY

## 2018-01-25 MED ORDER — IOPAMIDOL (ISOVUE-370) INJECTION 76%
INTRAVENOUS | Status: DC | PRN
  Administered 2018-01-25: 115 mL via INTRA_ARTERIAL

## 2018-01-25 MED ORDER — METOPROLOL TARTRATE 12.5 MG HALF TABLET: 12.5000 mg | ORAL_TABLET | Freq: Two times a day (BID) | ORAL | Status: DC

## 2018-01-25 MED ORDER — SODIUM BICARBONATE 8.4 % IV SOLN
INTRAVENOUS | Status: AC
  Filled 2018-01-25: qty 50

## 2018-01-25 MED ORDER — SODIUM CHLORIDE 0.9% FLUSH
3.0000 mL | Freq: Two times a day (BID) | INTRAVENOUS | Status: DC
  Administered 2018-01-25 – 2018-01-26 (×3): 3 mL via INTRAVENOUS

## 2018-01-25 MED ORDER — MIDAZOLAM HCL 2 MG/2ML IJ SOLN
INTRAMUSCULAR | Status: AC
  Filled 2018-01-25: qty 2

## 2018-01-25 MED ORDER — BISACODYL 10 MG RE SUPP
10.0000 mg | Freq: Every day | RECTAL | Status: DC | PRN
  Filled 2018-01-25: qty 1

## 2018-01-25 SURGICAL SUPPLY — 24 items
BALLN SAPPHIRE 2.5X12 (BALLOONS) ×2
BALLN SAPPHIRE 2.5X20 (BALLOONS) ×2
BALLOON SAPPHIRE 2.5X12 (BALLOONS) ×1 IMPLANT
BALLOON SAPPHIRE 2.5X20 (BALLOONS) ×1 IMPLANT
CATH EXTRAC PRONTO LP 6F RND (CATHETERS) ×2 IMPLANT
CATH INFINITI 5 FR AR1 MOD (CATHETERS) ×2 IMPLANT
CATH INFINITI 5 FR RCB (CATHETERS) ×2 IMPLANT
CATH INFINITI 5FR MULTPACK ANG (CATHETERS) ×2 IMPLANT
CATH LAUNCHER 6FR AR1 SH (CATHETERS) ×2 IMPLANT
CATH VISTA GUIDE RCB (CATHETERS) ×2 IMPLANT
CATHETER LAUNCHER 6FR MP1 (CATHETERS) ×2 IMPLANT
COVER PRB 48X5XTLSCP FOLD TPE (BAG) ×1 IMPLANT
COVER PROBE 5X48 (BAG) ×1
ELECT DEFIB PAD ADLT CADENCE (PAD) ×2 IMPLANT
KIT ENCORE 26 ADVANTAGE (KITS) ×4 IMPLANT
KIT HEART LEFT (KITS) ×2 IMPLANT
KIT MICROPUNCTURE NIT STIFF (SHEATH) ×2 IMPLANT
PACK CARDIAC CATHETERIZATION (CUSTOM PROCEDURE TRAY) ×2 IMPLANT
SHEATH AVANTI 11CM 6FR (SHEATH) ×2 IMPLANT
TRANSDUCER W/STOPCOCK (MISCELLANEOUS) ×2 IMPLANT
TUBING CIL FLEX 10 FLL-RA (TUBING) ×2 IMPLANT
WIRE EMERALD 3MM-J .035X150CM (WIRE) ×2 IMPLANT
WIRE EMERALD 3MM-J .035X260CM (WIRE) ×2 IMPLANT
WIRE HI TORQ BMW 190CM (WIRE) ×2 IMPLANT

## 2018-01-25 NOTE — Interval H&P Note (Signed)
History and Physical Interval Note:  02/09/2018 3:25 AM  [name]  has presented today for surgery, with the diagnosis of Urgent Non-STEMI  The various methods of treatment have been discussed with the patient and family. After consideration of risks, benefits and other options for treatment, the patient has consented to  Procedure(s): Coronary/Graft Acute MI Revascularization (N/A) LEFT HEART CATH AND CORONARY ANGIOGRAPHY (N/A) with possible PERCUTANEOUS CORONARY INTERVENTION as a surgical intervention .  The patient's history has been reviewed, patient examined, no change in status, stable for surgery.  I have reviewed the patient's chart and labs.  Questions were answered to the patient's satisfaction.     [name]

## 2018-01-25 NOTE — Progress Notes (Signed)
Results for [name], [name] (MRN [id number]) as of 11/27/17 16:13  Ref. Range 11/27/17 02:00 11/27/17 15:12  B Natriuretic Peptide Latest Ref Range: 0.0 - 100.0 pg/mL 1,002.9 (H) 1,288.0 (H)    BNP actually worse with SBT despite -3L diuresis    Plan No extubation No Keith BAR 11/27/17 Rest overnight - ok for PSV now Continue diuresis REcheck cxr/bnp 01/20/2018 and looks for extubation - consider extubation to bipap 01/17/2018   Dr. Kalman ShanMurali Velta Rockholt, M.D., [facility]C.P Pulmonary and Critical Care Medicine Staff Physician, [facility] Center Director - Interstitial Lung Disease  Program  Pulmonary Fibrosis [facility] [place], [place], [id number]  Pager: [phone], If no answer or between  15:00h - 7:00h: call [phone] Telephone: [phone]

## 2018-01-25 NOTE — H&P (Signed)
Cardiology Admission History and Physical:   Patient ID: [name]; MRN: [id number]; DOB: Apr 26, 1951   Admission date: 04/08/18  Primary Care Provider: Shirline Keith, Cory, NP Primary Cardiologist: Rollene RotundaJames Hochrein, MD  Primary Electrophysiologist:  N/a CT Surgeon: Dr. Barry Keith  Chief Complaint:  Chest Pain  Patient Profile:   [name] is a 67 y.o. male with a history of known multivessel CAD with multiple stents back in 2014 from [facility].  [place].  He had a cardiac catheterization January 2019 for progressively worsening aortic valve disease and chest pain.  He was found to have severe multivessel disease and underwent CABG x4 with LIMA-LAD, SVG-RCA, SVG-D1, SVG-OM as well as pericardial AVR.  Since discharge he has had symptoms concerning for possible constrictive pericarditis and has had off-and-on chest pain that has been treated with the prednisone taper and colchicine.    Medical history is also complicated by the fact that he has lupus anticoagulant and is on chronic warfarin therapy with his most recent INR of 3.6 prior to arrival here tonight.  He was just seen by Kenneth ShelterLuke Kilroy, PA on 01/20/2018.  There is consideration for possible need for surgical consult for possible pericardectomy.  Continued long taper of steroid along with colchicine and ibuprofen.  Plan was 2-week follow-up.  History of Present Illness:   [name] [facility] noting worsening chest discomfort that began at roughly 6 PM yesterday (01/24/2018).  The pain is definitely worse lying supine as well as worse with deep inspiration.  Walking around taking more breaths makes it worse.  Lying still sitting up makes it better.  However it is not fully resolved and is gotten worse after initially improving with nitroglycerin by EMS.  Upon arrival to ER he was still having chest pain and continued to have chest pain and 6/10.  EKG has notably  changed with widened QRS complex IVCD and ST depressions in inferior lateral leads with ST elevation in aVR as well as multiple PVCs.  His troponin is positive at 1.18.  Unfortunately his INR is also elevated at 3.5.  In addition to his chest discomfort he is also noted loss of GI side effects from the colchicine and ibuprofen with abdominal bloating gas and abdominal pain with bowel movements.   Past Medical History:  Diagnosis Date  . Anemia   . AS (aortic stenosis)    Mild  . Barrett's esophagus 11/11/2014  . CAD S/P percutaneous coronary angioplasty    7 stents per the patient (no record).  Stent to 90% inferior branch of an OM and mild scattered plaque in other vessels [facility]  01/08/13.  Cath 2016 with patent stents. He did have 70% stenosis in a small LAD that was about 2 mm. Size  . Coronary artery disease involving coronary bypass graft of native heart with angina pectoris (HCC) 06/27/2017  . Essential hypertension   . GERD (gastroesophageal reflux disease)   . Glaucoma   . History of hiatal hernia   . Hypercoagulable state (HCC)   . Hyperlipidemia   . Lupus (HCC)   . Peripheral vascular disease (HCC)    PE  dvt   hx 12-14 yrs  . Pneumonia    hx  . Pneumonitis    Lupus  . S/P aortic valve replacement with bioprosthetic valve 10/17/2017   25 mm Edwards Intuity Elite rapid-deployment stented bovine pericardial tissue valve  . S/P CABG x 4 10/17/2017  LIMA to LAD w/ endarterectomy of LAD, SVG to D1, SVG to OM, SVG to RCA, EVH via bilateral thighs  . Severe aortic stenosis   . Uses hearing aid     Past Surgical History:  Procedure Laterality Date  . AORTIC VALVE REPLACEMENT N/A 10/17/2017   Procedure: AORTIC VALVE REPLACEMENT (AVR),CORONARY ARTERY BY-PASS GRAFTING,TEE;  Surgeon: Purcell Nails, MD;  Location: [facility] OR;  Service: Open Heart Surgery;  Laterality: N/A;  . CARDIAC CATHETERIZATION N/A 04/01/2015   Procedure: Left Heart Cath and Coronary Angiography;  Surgeon:  Marykay Lex, MD;  Location: [facility] INVASIVE CV LAB;  Service: Cardiovascular;  Laterality: N/A;  . CATARACT EXTRACTION     x 2  . COLONOSCOPY    . CORONARY ARTERY BYPASS GRAFT N/A 10/17/2017   Procedure: CORONARY ARTERY BYPASS GRAFTING (CABG) time four using right and left saphaneous vien, harvested endoscopicly. and Left internal mammary artery. Coronary endartorectomy times one.;  Surgeon: Purcell Nails, MD;  Location: [facility] OR;  Service: Open Heart Surgery;  Laterality: N/A;  . EYE SURGERY    . KNEE SURGERY Right    x2 tendon  . NASAL SINUS SURGERY    . TEE WITHOUT CARDIOVERSION N/A 10/17/2017   Procedure: TRANSESOPHAGEAL ECHOCARDIOGRAM (TEE);  Surgeon: Purcell Nails, MD;  Location: [facility] OR;  Service: Open Heart Surgery;  Laterality: N/A;  . TONSILLECTOMY AND ADENOIDECTOMY    . UPPER GASTROINTESTINAL ENDOSCOPY       Medications Prior to Admission: Prior to Admission medications   Medication Sig Start Date End Date Taking? Authorizing Provider  acetaminophen (TYLENOL) 325 MG tablet Take 2 tablets (650 mg total) by mouth every 4 (four) hours as needed for headache or mild pain. 10/04/17   Abelino Derrick, PA-C  aspirin 81 MG chewable tablet Chew 1 tablet (81 mg total) by mouth daily. 10/05/17   Abelino Derrick, PA-C  Azelastine HCl 0.15 % SOLN USE 2 SPRAYS BY NASAL ROUTE AS NEEDED FOR CONGESTION 03/21/17   [provider]  brimonidine (ALPHAGAN) 0.15 % ophthalmic solution Place 1 drop into both eyes 2 (two) times daily.    [provider]  calcium carbonate (TUMS - DOSED IN MG ELEMENTAL CALCIUM) 500 MG chewable tablet Chew 0.5 tablets by mouth as needed for indigestion or heartburn.    [provider]  colchicine 0.6 MG tablet Take 1 tablet (0.6 mg total) by mouth daily. 01/20/18   Keith, Eda Paschal, PA-C  Emollient (GOLD BOND ULTIMATE HEALING) CREA Apply 1 application topically daily as needed (wound care).    [provider]  Flaxseed, Linseed, (FLAX SEED OIL)  1000 MG CAPS Take 1,000 mg by mouth 2 (two) times daily.    [provider]  ibuprofen (ADVIL,MOTRIN) 800 MG tablet Take 1 tablet (800 mg total) by mouth 2 (two) times daily for 5 days. 01/20/18 02/21/18  Abelino Derrick, PA-C  loperamide (IMODIUM A-D) 2 MG tablet Take 2-4 mg by mouth 2 (two) times daily as needed for diarrhea or loose stools.    [provider]  metoprolol tartrate (LOPRESSOR) 25 MG tablet TAKE 0.5 TABLETS BY MOUTH 2 TIMES DAILY 07/29/17   Rollene Rotunda, MD  NASONEX 50 MCG/ACT nasal spray Place 2 sprays into the nose daily as needed (allergies).  12/29/13   [provider]  nitroGLYCERIN (NITROSTAT) 0.4 MG SL tablet Place 1 tablet (0.4 mg total) under the tongue every 5 (five) minutes as needed for chest pain. 09/26/17 12/25/17  Barrett, Joline Salt,  PA-C  Omega-3 Fatty Acids (FISH OIL PO) Take 2 capsules by mouth daily.    [provider]  pantoprazole (PROTONIX) 40 MG tablet Take 1 tablet (40 mg total) by mouth daily. 10/05/17   Abelino Derrick, PA-C  predniSONE (DELTASONE) 10 MG tablet Take 6 tablets (60 mg total) by mouth daily with breakfast for 14 days, THEN 5 tablets (50 mg total) daily with breakfast for 14 days, THEN 4 tablets (40 mg total) daily with breakfast for 14 days, THEN 3 tablets (30 mg total) daily with breakfast for 14 days, THEN 2 tablets (20 mg total) daily with breakfast for 14 days, THEN 1 tablet (10 mg total) daily with breakfast for 14 days. 01/07/18 04/01/18  Rollene Rotunda, MD  ramipril (ALTACE) 5 MG capsule TAKE ONE CAPSULE BY MOUTH DAILY 06/25/17   Nafziger, Kandee Keen, NP  timolol (BETIMOL) 0.5 % ophthalmic solution Place 1 drop into both eyes 2 (two) times daily.    [provider]  traMADol (ULTRAM) 50 MG tablet Take 1 tablet (50 mg total) by mouth every 4 (four) hours as needed for moderate pain. 01/20/18   Abelino Derrick, PA-C  Turmeric Curcumin 500 MG CAPS Take 500 mg by mouth 2 (two) times daily.    [provider]   warfarin (COUMADIN) 4 MG tablet Take 1 tablet (4 mg total) by mouth daily at 6 PM. 10/23/17   Barrett, Erin R, PA-C     Allergies:    Allergies  Allergen Reactions  . Sulfa Antibiotics Other (See Comments)    Makes patient jittery, prefers not to take them.     Social History:   Social History   Tobacco Use  . Smoking status: Never Smoker  . Smokeless tobacco: Never Used  Substance Use Topics  . Alcohol use: Yes    Comment: 2 beers month?  . Drug use: No   Social History   Social History Narrative   Married for 42 years   Has two grown children and three grandchildren.    Retired on disability   No longer have pets. Moved from a home to an apartment   Likes to read,workout, travel.      Family History:   The patient's family history includes Arthritis in his father and mother; CAD (age of onset: 73) in his father; Diabetes in his father and mother; Hypertension in his father and mother.    ROS:  Review of Systems  Constitutional: Positive for chills, diaphoresis and malaise/fatigue.  HENT: Negative for nosebleeds.   Respiratory: Positive for shortness of breath. Negative for cough and wheezing.   Cardiovascular: Positive for chest pain, palpitations and leg swelling (Worse over the last 2-3 days).  Gastrointestinal: Positive for abdominal pain (Worse with bowel movements), diarrhea and nausea. Negative for blood in stool and melena.       Abdominal bloating, distention  Genitourinary: Negative for hematuria.  Neurological: Positive for dizziness.  All other systems reviewed and are negative.   Physical Exam/Data:   Vitals:   01/27/18 0200 01-27-18 0219 2018/01/27 0220 2018-01-27 0230  BP: (!) 134/98 (!) 128/99  119/87  Pulse: 100 90  86  Resp: 19 18  16   Temp:  (!) 97.5 F (36.4 C)    TempSrc:  Oral    SpO2: 100% 100%  100%  Weight:   168 lb (76.2 kg)   Height:   5\' 9"  (1.753 m)    No intake or output data in the 24 hours ending 2018/01/27  [id number] Filed Weights    02-08-2018 0220  Weight: 168 lb (76.2 kg)   Body mass index is 24.81 kg/m.  General:  Well nourished, well developed, in notable distress.  Ill-appearing. HEENT: normal Lymph: no adenopathy Neck: JVP roughly 10-12 cm water. Endocrine:  No thryomegaly Vascular: No carotid bruits; FA pulses 2+ bilaterally without bruits  Cardiac: Mostly RRR with normal S1 1 and split S2.  There is a clear to-3/6 SEM at the base as well as what seems to be a whole systolic murmur heard at the apex.  There also does appear to be at least a 2 if not 3 part rub  lungs: Diminished breath sounds throughout bibasal rales.  No wheezing Abd: Mildly distended and somewhat tympanitic.  Tender to palpation.  Normal bowel sounds. Ext: Roughly 2+ pitting edema to the knees Musculoskeletal:  No deformities, BUE and BLE strength normal and equal Skin: Warm, somewhat diaphoretic and flushed appearing Neuro:  CNs 2-12 intact, no focal abnormalities noted Psych:  Normal affect    EKG:  The ECG that was done at 1:47 AM was personally reviewed and demonstrates sinus rhythm with ~ LBBB/IVCD & PVCs (triplet). Diffuse ST Depressions 3-4 mm.  Relevant CV Studies: 2 D Echo 01/15/18:  Normal LV size with mild LV hypertrophy. EF 50% with abnormal septal motion. There does appear to be a degree of septal-lateral dyssynchrony that can be seen with LBBB-type IVCD, but there also appears to be a degree of respirophasic variation to the septal motion. This would be concerning for a constrictive pericarditis-type picture. Unfortunately, respirometry was not  done. The IVC also was not markedly dilated. The pericardium is hard to quantify on echo but looks somewhat thickened in some views. Restrictive diastolic function. Mildly dilated RV with  mildly decreased systolic function. Bioprosthetic aortic valve appears normal. Moderate to severe MR, moderate by PISA but looks more severe visually.  If clinical picture could be consistent with  constrictive pericarditis, would consider further workup including echo with respirometry, cardiac MRI with free breathing, and/or hemodynamic cath. Cardiac Cath 10/02/2017: Severe multivessel CAD with diffuse 80% ostial LAD stenosis before the previously placed LAD stent and diffuse 80% mid LAD stenosis beyond the stented segment; 80% calcified ostial left circumflex stenosis with patent proximal circumflex stent beyond the ostium and patent bifurcation stents in the OM vessel and mid AV groove circumflex with 50% distal circumflex stenosis; patent proximal RCA stent, but with new 95% irregular eccentric plaque in the mid RCA.  Severely reduced aortic valve excursion suggestive of severe aortic stenosis with recent echo in October 2018 showing a mean gradient of 38 and peak gradient of 54 mmHg.  Mildly dilated aortic root.  RECOMMENDATION: Surgical consultation will be obtained for CABG revascularization surgery and aortic valve replacement.  The patient apparently has a coagulation disorder  with lupus and had been on Coumadin which was stopped for the catheterization.  He will be heparinized 8 hours following sheath removal.   Diagnostic Diagram      Laboratory Data:  ChemistryNo results for input(s): NA, K, CL, CO2, GLUCOSE, BUN, CREATININE, CALCIUM, GFRNONAA, GFRAA, ANIONGAP in the last 168 hours.  No results for input(s): PROT, ALBUMIN, AST, ALT, ALKPHOS, BILITOT in the last 168 hours. Hematology Recent Labs  Lab 02/08/2018 0200  WBC 14.7*  RBC 4.62  HGB 10.1*  HCT 33.7*  MCV 72.9*  MCH 21.9*  MCHC 30.0  RDW 18.5*  PLT 195   Cardiac EnzymesNo results for input(s): TROPONINI in  the last 168 hours.  Recent Labs  Lab Jan 27, 2018 0205  TROPIPOC 1.18*    BNPNo results for input(s): BNP, PROBNP in the last 168 hours.  DDimer No results for input(s): DDIMER in the last 168 hours.  Radiology/Studies:  Dg Chest Portable 1 View  Result Date: 27-Jan-2018 CLINICAL DATA:  Chest  pain and dyspnea tonight EXAM: PORTABLE CHEST 1 VIEW COMPARISON:  12/10/2017 FINDINGS: Cardiomegaly with minimal aortic atherosclerosis. Status post median sternotomy with post CABG change aortic valvular replacement. Elevated right hemidiaphragm, chronic in appearance with right basilar atelectasis. No pneumothorax or effusion. No acute osseous abnormality. IMPRESSION: 1. Stable cardiomegaly with post CABG change and aortic valvular replacement. 2. Streaky right basilar atelectasis with chronic mild elevation of the right hemidiaphragm. 3. No pneumothorax, effusion or CHF. Electronically Signed   By: Tollie Eth M.D.   On: 01/27/2018 02:22    Assessment and Plan:   Principal Problem:   Non-STEMI (non-ST elevated myocardial infarction) (HCC) Active Problems:   CAD S/P percutaneous coronary angioplasty: reports 7 stents   Essential hypertension   Lupus anticoagulant with hypercoagulable state (HCC)   Long term (current) use of anticoagulants   S/P aortic valve replacement with bioprosthetic valve + CABG x4   Pericardial friction rub   Dressler's syndrome (HCC)   Moderate mitral regurgitation  Clinically very difficult decision to make.  The patient is definitely having chest pain that seems relatively different than his previous angina, however he has positive troponins and a grossly abnormal EKG.  He does not meet STEMI criteria, but it is definitely worrisome for ischemic changes and he has known multivessel disease with recent CABG.  He is also currently being treated for possible consider pericarditis.  He is hemodynamically stable blood pressure and heart rate, but appears quite ill and diaphoretic.  Very conflicted as far as a decision what to do here.  With ongoing pain and positive troponin in his clinical picture I think we do need to exclude an occluded vein graft.  The main concern however is that he has an INR that is elevated and it has traditionally been a femoral access case.  I did  talk with the patient and his wife about concerns and options, but I think the only option is to proceed with catheterization given his ongoing pain.  Depending on what we find would consider adding right heart cath to evaluate for hemodynamics if there is no PCI target. For sheath pull would consider low-dose vitamin K to reverse the INR.  Regardless, following his catheterization, he will need to be admitted for ICU monitoring.  His statin is currently on hold while on his pericarditis medications including high-dose steroid taper, colchicine and ibuprofen.  I would continue these medications while inpatient.  He does have significant edema and apparent pulmonary edema on chest x-ray.  BNP is grossly elevated and had a very well benefit from diuresis, however we did know what his filling pressures are with concern for conservative peritonitis.    Severity of Illness: The appropriate patient status for this patient is INPATIENT. Inpatient status is judged to be reasonable and necessary in order to provide the required intensity of service to ensure the patient's safety. The patient's presenting symptoms, physical exam findings, and initial radiographic and laboratory data in the context of their chronic comorbidities is felt to place them at high risk for further clinical deterioration. Furthermore, it is not anticipated that the patient will be medically stable for discharge from the hospital within  2 midnights of admission. The following factors support the patient status of inpatient.   " The patient's presenting symptoms include chest pain, shortness of breath, edema. " The worrisome physical exam findings include edema on exam, basal rales. " The initial radiographic and laboratory data are wor  risome because of .grossly abnormal EKG and positive troponin.  Also concerning elevated INR " The chronic co-morbidities include recent CABG and AVR, lupus anticoagulant, and presumed  tuberculosis..   * I certify that at the point of admission it is my clinical judgment that the patient will require inpatient hospital care spanning beyond 2 midnights from the point of admission due to high intensity of service, high risk for further deterioration and high frequency of surveillance required.*    For questions or updates, please contact [facility] Please consult [email or web address] for contact info under Cardiology/STEMI.    Signed, Bryan Lemma, MD  01/15/2018 2:49 AM

## 2018-01-25 NOTE — ED Triage Notes (Signed)
Pt BIB EMS from home. Reports CP since approx 1800 this evening with pain radiating to back, L arm. Also reports SOB, diaphoresis. Hx bypass in 10/2017 and pericarditis dx'd about 3weeks ago. Given 324mg  ASA, 1 Nitro by EMS en route. Pain helped by nitro.

## 2018-01-25 NOTE — Progress Notes (Addendum)
Patient ID: [name], male   DOB: 18-Nov-1950, 67 y.o.   MRN: [id number]     Advanced Heart Failure Rounding Note  PCP-Cardiologist: Minus Breeding, MD   Subjective:    Events of yesterday reviewed.  Patient admitted with NSTEMI, found to have occluded SVG-PDA as likely culprit.  LVEDP > 30, he ended up being intubated. He has had Lasix 60 mg IV x 1 with good UOP.    He is intubated and awake, able to communicate with white board.  Has had pleuritic CP and dyspnea for weeks now.  He has some blood in OG tube this morning.    INR > 3, femoral arterial sheath remains in place.   LHC (02/10/2018): Occluded SVG-PDA (new), occluded RCA, occluded LCx.  80% anastomotic lesion of SVG-OM, 80% anastomotic lesion of SVG-D1, patent LIMA-LAD with 80% distal LAD.  No intervention.  EF 35-45% on LV-gram.   Echo (4/19): EF 50%, bioprosthetic aortic valve looked ok, mildly dilated RV with mildly decreased systolic function, moderate-severe MR (moderate by PISA), no pericardial effusion though concern for pericardial thickening.  Respirophasic variation of IV septum concerning for constrictive pericarditis picture.    Objective:   Weight Range: 171 lb 11.8 oz (77.9 kg) Body mass index is 25.36 kg/m.   Vital Signs:   Temp:  [97.5 F (36.4 C)-98.1 F (36.7 C)] 98.1 F (36.7 C) (04/13 0800) Pulse Rate:  [0-141] 61 (04/13 1000) Resp:  [0-120] 15 (04/13 1030) BP: (100-145)/(70-111) 119/87 (04/13 1000) SpO2:  [0 %-100 %] 100 % (04/13 1000) Arterial Line BP: (120-175)/(69-116) 129/70 (04/13 1030) FiO2 (%):  [50 %] 50 % (04/13 0800) Weight:  [168 lb (76.2 kg)-171 lb 11.8 oz (77.9 kg)] 171 lb 11.8 oz (77.9 kg) (04/13 0615) Last BM Date: 01/20/2018  Weight change: Filed Weights   01/28/2018 0220 01/30/2018 0615  Weight: 168 lb (76.2 kg) 171 lb 11.8 oz (77.9 kg)    Intake/Output:   Intake/Output Summary (Last 24 hours) at 01/17/2018 1105 Last data filed at 01/21/2018 0900 Gross per 24 hour  Intake  183.63 ml  Output 1500 ml  Net -1316.37 ml      Physical Exam    General: Intubated, awake.  Neck: JVP 12-14 cm, no thyromegaly or thyroid nodule.  Lungs: Crackles at bases.  CV: Nondisplaced PMI.  Heart regular S1/S2, no S3/S4, 2/6 HSM apex, ?friction rub.  1+ ankle edema.  Abdomen: Soft, nontender, no hepatosplenomegaly, no distention.  Skin: Intact without lesions or rashes.  Neurologic: Alert, able to communicate.  Psych: Normal affect. Extremities: No clubbing or cyanosis.  HEENT: Normal.    Telemetry   NSR with PVCs (personallly reviewed)  EKG    NSR, IVCD 146 msec, PVCs (personally reviewed)  Labs    CBC Recent Labs    02/11/2018 0200 01/16/2018 0739  WBC 14.7* 11.7*  HGB 10.1* 10.1*  HCT 33.7* 33.4*  MCV 72.9* 72.1*  PLT 195 409   Basic Metabolic Panel Recent Labs    02/02/2018 0200 01/29/2018 0739  NA 134* 134*  K 3.7 3.6  CL 100* 98*  CO2 23 24  GLUCOSE 110* 103*  BUN 26* 20  CREATININE 0.99 0.60*  CALCIUM 8.7* 8.2*   Liver Function Tests No results for input(s): AST, ALT, ALKPHOS, BILITOT, PROT, ALBUMIN in the last 72 hours. No results for input(s): LIPASE, AMYLASE in the last 72 hours. Cardiac Enzymes Recent Labs    01/27/2018 0200  TROPONINI 1.04*    BNP: BNP (last 3  results) Recent Labs    11/05/17 1543 02/11/2018 0200  BNP 300* 1,002.9*    ProBNP (last 3 results) No results for input(s): PROBNP in the last 8760 hours.   D-Dimer No results for input(s): DDIMER in the last 72 hours. Hemoglobin A1C No results for input(s): HGBA1C in the last 72 hours. Fasting Lipid Panel Recent Labs    02/05/2018 0200  CHOL 76  HDL 22*  LDLCALC 38  TRIG 82  CHOLHDL 3.5   Thyroid Function Tests No results for input(s): TSH, T4TOTAL, T3FREE, THYROIDAB in the last 72 hours.  Invalid input(s): FREET3  Other results:   Imaging    Portable Chest X-ray  Result Date: 02/04/2018 CLINICAL DATA:  Evaluate ETT EXAM: PORTABLE CHEST 1 VIEW  COMPARISON:  January 25, 2018 FINDINGS: The ETT terminates in the mid trachea in good position. No pneumothorax. Mild cardiomegaly. The hila and mediastinum are unchanged. No pulmonary nodules or masses. No focal infiltrates. No overt edema. IMPRESSION: The ETT is in good position.  No other interval change. Electronically Signed   By: Dorise Bullion III M.D   On: 02/07/2018 07:10   Dg Chest Portable 1 View  Result Date: 01/24/2018 CLINICAL DATA:  Chest pain and dyspnea tonight EXAM: PORTABLE CHEST 1 VIEW COMPARISON:  12/10/2017 FINDINGS: Cardiomegaly with minimal aortic atherosclerosis. Status post median sternotomy with post CABG change aortic valvular replacement. Elevated right hemidiaphragm, chronic in appearance with right basilar atelectasis. No pneumothorax or effusion. No acute osseous abnormality. IMPRESSION: 1. Stable cardiomegaly with post CABG change and aortic valvular replacement. 2. Streaky right basilar atelectasis with chronic mild elevation of the right hemidiaphragm. 3. No pneumothorax, effusion or CHF. Electronically Signed   By: Ashley Royalty M.D.   On: 01/24/2018 02:22      Medications:     Scheduled Medications: . atorvastatin  80 mg Oral q1800  . brimonidine  1 drop Both Eyes BID  . carvedilol  3.125 mg Oral BID WC  . chlorhexidine gluconate (MEDLINE KIT)  15 mL Mouth Rinse BID  . colchicine  0.6 mg Oral Daily  . fluticasone  2 spray Each Nare Daily  . furosemide  60 mg Intravenous BID  . insulin aspart  1-3 Units Subcutaneous Q4H  . mouth rinse  15 mL Mouth Rinse QID  . nitroGLYCERIN      . pantoprazole (PROTONIX) IV  40 mg Intravenous Q12H  . predniSONE  50 mg Oral Q breakfast   Followed by  . [START ON 02/04/2018] predniSONE  40 mg Oral Q breakfast   Followed by  . [START ON 02/18/2018] predniSONE  30 mg Oral Q breakfast   Followed by  . [START ON 03/04/2018] predniSONE  20 mg Oral Q breakfast   Followed by  . [START ON 03/18/2018] predniSONE  10 mg Oral Q breakfast   . ramipril  5 mg Oral Daily  . sodium chloride flush  3 mL Intravenous Q12H  . sucralfate  1 g Oral TID WC & HS  . timolol  1 drop Both Eyes BID     Infusions: . sodium chloride 20 mL/hr at 02/05/2018 0900  . sodium chloride 50 mL/hr at 01/29/2018 0900  . sodium chloride    . fentaNYL infusion INTRAVENOUS 25 mcg/hr (02/06/2018 0900)     PRN Medications:  sodium chloride, acetaminophen, bisacodyl, docusate, fentaNYL, ipratropium-albuterol, labetalol, loperamide, midazolam, midazolam, nitroGLYCERIN, ondansetron (ZOFRAN) IV, sodium chloride flush    Assessment/Plan   1. CAD: Patient is s/p CABG x 4 10/19/08  with LIMA-LAD, SVG-D, SVG-D1, SVG-PDA.  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  Last night, admitted with NSTEMI.  Found to have occlusion of SVG-RCA as likely culprit, native RCA TO.  Also with 80% anastomotic lesions of SVG-OM and SVG-D.  LIMA-LAD patent but distal LAD with 80% stenosis diffusely.  Unable to open SVG-PDA last night.  It is possible that inflammation from post-pericardiotomy syndrome drove early graft disease.   - Will need to review with interventional colleagues.  Would likely not intervene on SVG-D given small D, would consider intervention on SVG-OM. ?Consider intervention on CTO RCA.  This is complicated by need for anticoagulation for anti-phospholipid Ab syndrome as well as treatment of the pericarditis.  - Continue atorvastatin 80 mg daily (watch for any evidence of myopathy with colchicine).  - Got ASA today.  - INR 3.56. Arterial sheath will remain in until this trends down.  2. 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  Most recent echo was concerning for development of constrictive physiology.  He has been on prednisone + colchicine + Ibuprofen.  - Would stop ibuprofen with MI.  He has blood in OGT, so will not transition to aspirin yet.  If bleeding is transient, would use ASA 650 mg every 8 hrs (reassess in  am).  - Prednisone is a concern with NSTEMI.  However, he has had symptoms concerning for intractable post-pericardiotomy syndrome.  He is on prednisone taper currently, will continue for now.  - Continue colchicine 0.6 daily.  - Send ESR and CRP.  - Will need echo this admission to reassess EF, also to assess for constrictive pericarditis => needs respirometry.  Do echo after he is extubated. Eventually cardiac MRI and hemodynamic right/left heart cath will likely be helpful.  3. Acute on chronic systolic CHF: EF 29-56% with LV-gram.  He is volume overloaded on exam, LVEDP > 30 at cath and required intubation. He is diuresing well with IV Lasix.  - Continue Lasix 60 mg IV bid and replace K.  - Continue home ramipril.  - Stop metoprolol, use Coreg 3.125 mg bid.   4. Acute hypoxemic respiratory failure: Intubated in cath lab.  If he can successfully wean today with excellent diuresis, could possibly extubate today (versus tomorrow).  5. Upper GI bleeding: Blood noted in OGT.  He is at risk for gastritis/ulcer with use of prednisone and ibuprofen.  - Stop ibuprofen.   - Protonix 40 mg IV bid + Carafate.  - Follow CBC, repeat tonight.  6. SLE with Antiphospholipid Ab syndrome: with h/o DVT and PE.  Supratherapeutic INR on coumadin.  Allowing it to trend down.   CRITICAL CARE Performed by: Loralie Champagne  Total critical care time: 45 minutes  Critical care time was exclusive of separately billable procedures and treating other patients.  Critical care was necessary to treat or prevent imminent or life-threatening deterioration.  Critical care was time spent personally by me on the following activities: development of treatment plan with patient and/or surrogate as well as nursing, discussions with consultants, evaluation of patient's response to treatment, examination of patient, obtaining history from patient or surrogate, ordering and performing treatments and interventions, ordering and review  of laboratory studies, ordering and review of radiographic studies, pulse oximetry and re-evaluation of patient's condition. Length of Stay: 0  Loralie Champagne, MD  02/09/2018, 11:05 AM  Advanced Heart Failure Team Pager [phone] (M-F; 7a - 4p)  Please contact [facility] for night-coverage after hours (4p -7a ) and  weekends on [email or web address]

## 2018-01-25 NOTE — Consult Note (Signed)
PULMONARY / CRITICAL CARE MEDICINE   Name: [name] MRN: [id number] DOB: [birth year]    ADMISSION DATE:  02/04/2018 CONSULTATION DATE:  01/14/2018  REFERRING MD:  Dr. Herbie Baltimore  CHIEF COMPLAINT:  NSTEMI  HISTORY OF PRESENT ILLNESS:   HPI obtained from medical chart review as patient is intubated and sedated on mechanical ventilation.  67 year old male with PMH of lupus anticoagulant on chronic coumadin, GERD, HTN, PVD, barrettsCAD w/stents and AS with recent CABG x 4 (LIMA-LAD, SVG-RCA, SVG-D1, SVG-OM) and AVR in 10/17/2017 per Dr. Barry Dienes.   Since surgery, patient has been having intermittent chest pain with symptoms concerning for restrictive pericarditis treated with prednisone taper and colchicine.  Presented on the morning of 4/13 with one week progressive symptoms of chest pain, diaphoresis, and SOB. In ER, patient with some pulmonary edema and peripheral edema, labs noted for BNP 1002, troponin I 1.04, WBC 14.7, PLT 195,  PT 35, INR 3.56, sCr 0.99,  EKG changed from prior with widened QRS complex IVCD and ST depression in inferior lateral leads and elevation in aVR.  He was taken emergently to cath lab per Dr. Herbie Baltimore.  A thrombectomy and balloon angioplasty was performed to his SVG- RCA.  Other grafts concerning for stenosis.  LVEDP notably reduced at 29-35%.  Nearing end of procedure, patient started to have increasing work of breathing and hypoxia requiring emergent intubation by anesthesia, then transferred to ICU.  PCCM called for ventilator management.   PAST MEDICAL HISTORY :  He  has a past medical history of Anemia, AS (aortic stenosis), Barrett's esophagus (11/11/2014), CAD S/P percutaneous coronary angioplasty, Coronary artery disease involving coronary bypass graft of native heart with angina pectoris (HCC) (06/27/2017), Essential hypertension, GERD (gastroesophageal reflux disease), Glaucoma, History of hiatal hernia, Hypercoagulable state (HCC), Hyperlipidemia, Lupus (HCC),  Peripheral vascular disease (HCC), Pneumonia, Pneumonitis, S/P aortic valve replacement with bioprosthetic valve (10/17/2017), S/P CABG x 4 (10/17/2017), Severe aortic stenosis, and Uses hearing aid.  PAST SURGICAL HISTORY: He  has a past surgical history that includes Tonsillectomy and adenoidectomy; Knee surgery (Right); Cataract extraction; Nasal sinus surgery; Colonoscopy; Upper gastrointestinal endoscopy; Cardiac catheterization (N/A, 04/01/2015); Eye surgery; Aortic valve replacement (N/A, 10/17/2017); Coronary artery bypass graft (N/A, 10/17/2017); and TEE without cardioversion (N/A, 10/17/2017).  Allergies  Allergen Reactions  . Sulfa Antibiotics Other (See Comments)    Makes patient jittery, prefers not to take them.     No current facility-administered medications on file prior to encounter.    Current Outpatient Medications on File Prior to Encounter  Medication Sig  . acetaminophen (TYLENOL) 325 MG tablet Take 2 tablets (650 mg total) by mouth every 4 (four) hours as needed for headache or mild pain.  Marland Kitchen aspirin 81 MG chewable tablet Chew 1 tablet (81 mg total) by mouth daily.  . Azelastine HCl 0.15 % SOLN USE 2 SPRAYS BY NASAL ROUTE AS NEEDED FOR CONGESTION  . brimonidine (ALPHAGAN) 0.15 % ophthalmic solution Place 1 drop into both eyes 2 (two) times daily.  . calcium carbonate (TUMS - DOSED IN MG ELEMENTAL CALCIUM) 500 MG chewable tablet Chew 0.5 tablets by mouth as needed for indigestion or heartburn.  . colchicine 0.6 MG tablet Take 1 tablet (0.6 mg total) by mouth daily.  . Emollient (GOLD BOND ULTIMATE HEALING) CREA Apply 1 application topically daily as needed (wound care).  . Flaxseed, Linseed, (FLAX SEED OIL) 1000 MG CAPS Take 1,000 mg by mouth 2 (two) times daily.  Marland Kitchen ibuprofen (ADVIL,MOTRIN) 800 MG tablet Take  1 tablet (800 mg total) by mouth 2 (two) times daily for 5 days.  Marland Kitchen loperamide (IMODIUM A-D) 2 MG tablet Take 2-4 mg by mouth 2 (two) times daily as needed for diarrhea or  loose stools.  . metoprolol tartrate (LOPRESSOR) 25 MG tablet TAKE 0.5 TABLETS BY MOUTH 2 TIMES DAILY  . NASONEX 50 MCG/ACT nasal spray Place 2 sprays into the nose daily as needed (allergies).   . nitroGLYCERIN (NITROSTAT) 0.4 MG SL tablet Place 1 tablet (0.4 mg total) under the tongue every 5 (five) minutes as needed for chest pain.  . Omega-3 Fatty Acids (FISH OIL PO) Take 2 capsules by mouth daily.  . pantoprazole (PROTONIX) 40 MG tablet Take 1 tablet (40 mg total) by mouth daily.  . predniSONE (DELTASONE) 10 MG tablet Take 6 tablets (60 mg total) by mouth daily with breakfast for 14 days, THEN 5 tablets (50 mg total) daily with breakfast for 14 days, THEN 4 tablets (40 mg total) daily with breakfast for 14 days, THEN 3 tablets (30 mg total) daily with breakfast for 14 days, THEN 2 tablets (20 mg total) daily with breakfast for 14 days, THEN 1 tablet (10 mg total) daily with breakfast for 14 days.  . ramipril (ALTACE) 5 MG capsule TAKE ONE CAPSULE BY MOUTH DAILY  . timolol (BETIMOL) 0.5 % ophthalmic solution Place 1 drop into both eyes 2 (two) times daily.  . traMADol (ULTRAM) 50 MG tablet Take 1 tablet (50 mg total) by mouth every 4 (four) hours as needed for moderate pain.  . Turmeric Curcumin 500 MG CAPS Take 500 mg by mouth 2 (two) times daily.  Marland Kitchen warfarin (COUMADIN) 4 MG tablet Take 1 tablet (4 mg total) by mouth daily at 6 PM.    FAMILY HISTORY:  His indicated that his mother is deceased. He indicated that his father is deceased. He indicated that his maternal grandmother is deceased. He indicated that his maternal grandfather is deceased. He indicated that his paternal grandmother is deceased. He indicated that his paternal grandfather is deceased.   SOCIAL HISTORY: He  reports that he has never smoked. He has never used smokeless tobacco. He reports that he drinks alcohol. He reports that he does not use drugs.  REVIEW OF SYSTEMS:   Unable as patient is intubated and mechanically  ventilated   SUBJECTIVE:   VITAL SIGNS: BP 116/88   Pulse (!) 56   Temp (!) 97.5 F (36.4 C) (Oral)   Resp 16   Ht 5\' 9"  (1.753 m)   Wt 168 lb (76.2 kg)   SpO2 95%   BMI 24.81 kg/m   HEMODYNAMICS:    VENTILATOR SETTINGS:    INTAKE / OUTPUT: No intake/output data recorded.  PHYSICAL EXAMINATION: General:  Critically ill male sedated and intubated on MV HEENT: pupils 3/reactive, ETT at 23 at lip, mottling/cynaosis from nipple line up, +JVD Neuro: sedated, not f/c, coughs CV: rrr, SEM, +rub, faint distal pulses PULM: even/non-labored on MV, lungs bilaterally clear, end exp diffuse wheeze, diminished in bases GI: distended, firm in suprapubic area/ pain to palpation, hypo bs  Extremities: cold/mottled, BLE 2+ edema Skin: mottled  LABS:  BMET Recent Labs  Lab 08-Feb-2018 0200  NA 134*  K 3.7  CL 100*  CO2 23  BUN 26*  CREATININE 0.99  GLUCOSE 110*    Electrolytes Recent Labs  Lab 02-08-2018 0200  CALCIUM 8.7*    CBC Recent Labs  Lab 2018-02-08 0200  WBC 14.7*  HGB 10.1*  HCT 33.7*  PLT 195    Coag's Recent Labs  Lab 01/20/18 01/27/2018 0200  APTT  --  34  INR 3.6 3.56    Sepsis Markers No results for input(s): LATICACIDVEN, PROCALCITON, O2SATVEN in the last 168 hours.  ABG No results for input(s): PHART, PCO2ART, PO2ART in the last 168 hours.  Liver Enzymes No results for input(s): AST, ALT, ALKPHOS, BILITOT, ALBUMIN in the last 168 hours.  Cardiac Enzymes Recent Labs  Lab 01/23/2018 0200  TROPONINI 1.04*    Glucose Recent Labs  Lab 02/05/2018 0242  GLUCAP 95    Imaging Dg Chest Portable 1 View  Result Date: 02/08/2018 CLINICAL DATA:  Chest pain and dyspnea tonight EXAM: PORTABLE CHEST 1 VIEW COMPARISON:  12/10/2017 FINDINGS: Cardiomegaly with minimal aortic atherosclerosis. Status post median sternotomy with post CABG change aortic valvular replacement. Elevated right hemidiaphragm, chronic in appearance with right basilar  atelectasis. No pneumothorax or effusion. No acute osseous abnormality. IMPRESSION: 1. Stable cardiomegaly with post CABG change and aortic valvular replacement. 2. Streaky right basilar atelectasis with chronic mild elevation of the right hemidiaphragm. 3. No pneumothorax, effusion or CHF. Electronically Signed   By: Tollie Ethavid  Kwon M.D.   On: 01/24/2018 02:22   STUDIES:  4/13 LHC >>  CULTURES: none  ANTIBIOTICS: none  SIGNIFICANT EVENTS: 4/13  Admit/ LHC  LINES/TUBES: PIV  4/13 R femoral sheath >> 4/13 ETT >>  DISCUSSION: 89 yoM with PMH of lupus anticoagulant on coumadin, severe CAD/ AS s/p CABG x 4 and AVR in 10/2017 with complications of pericarditis since, presenting with one week hx of CP, SOB presenting with acute NSTEMI taken emergently to cath lab  ASSESSMENT / PLAN:  PULMONARY A: Acute hypoxic respiratory failure P:   Full MV support, PRVC 8cc/kg Wean Fio2/ PEEP for sats > 94% ABG and CXR now S/p lasix  VAP protocol duonebs prn q 6hr  CARDIOVASCULAR A:  NSTEMI with acute systolic dysfunction Restrictive pericarditis/ Dresslers  Mod MR Hx CAD/ AS s/p CABG x 4 and AVR w/bioprosthetic valve 10/2017, HTN, PVD P:  Per Cardiology Tele monitoring Goal MAP > 65 S/p lasix 40 mg Trend troponin and EKG Continue prednisone/ NSAIDS/ colchicine per cards  Holding further CVC/ removal of sheath till INR improves- can not reverse given   RENAL A:   At risk for AKI P:   Bladder scan now S/p lasix 40 mg Insert foley if needed, caution given INR 3.5 Trend BMP / mag/ phos/ daily wt/ urinary output Replace electrolytes as indicated  GASTROINTESTINAL A:   NPO Hx GERD, barrett's esophagus  Abd distention - bladder distention P:   NPO OGT KUB for abd distention PPI BID for SUP  HEMATOLOGIC A:   Supra therapeutic INR on warfarin - INR 3.5 At risk for bleeding Hx lupus anticoagulant  P:  Will hold off on reversal given tenuous grafts  Trend coags Hold on  removal of sheath or insertion of invasive lines till INR improved ideally < 2  INFECTIOUS A:   No obvious acute process P:   Trend WBC/ fever curve Monitor clinically   ENDOCRINE A:   Mild hyperglycemia - steroid induced   P:   CBG q 4 while NPO SSI while on steroids   NEUROLOGIC A:   Acute encephalopathy - related to sedation P:   RASS goal: -1/-2 Continuous fentanyl, prn versed DWA as tolerated Monitor neuro exams  FAMILY  - Updates: no family at bedside.    - Inter-disciplinary family meet or  Palliative Care meeting due by:  4/13  CCT 60 mins  Posey Boyer, AGACNP-BC [facility] Pgr: [phone] or if no answer [phone] Feb 14, 2018, 6:15 AM

## 2018-01-25 NOTE — Progress Notes (Signed)
PULMONARY / CRITICAL CARE MEDICINE   Name: Kenneth HerbDavid T Keith MRN: [id number] DOB: 12-02-1950    ADMISSION DATE:  2018/09/03 CONSULTATION DATE:  2018/09/03  REFERRING MD:  Dr. Herbie BaltimoreHarding  CHIEF COMPLAINT:  NSTEMI  HISTORY OF PRESENT ILLNESS:   HPI obtained from medical chart review as patient is intubated and sedated on mechanical ventilation.  67 year old male with PMH of lupus anticoagulant on chronic coumadin, GERD, HTN, PVD, barrettsCAD w/stents and AS with recent CABG x 4 (LIMA-LAD, SVG-RCA, SVG-D1, SVG-OM) and AVR in 10/17/2017 per Dr. Barry Dieneswens.   Since surgery, patient has been having intermittent chest pain with symptoms concerning for restrictive pericarditis treated with prednisone taper and colchicine.  Presented on the morning of 4/13 with one week progressive symptoms of chest pain, diaphoresis, and SOB. In ER, patient with some pulmonary edema and peripheral edema, labs noted for BNP 1002, troponin I 1.04, WBC 14.7, PLT 195,  PT 35, INR 3.56, sCr 0.99,  EKG changed from prior with widened QRS complex IVCD and ST depression in inferior lateral leads and elevation in aVR.  He was taken emergently to cath lab per Dr. Herbie BaltimoreHarding.  A thrombectomy and balloon angioplasty was performed to his SVG- RCA.  Other grafts concerning for stenosis.  LVEDP notably reduced at 29-35%.  Nearing end of procedure, patient started to have increasing work of breathing and hypoxia requiring emergent intubation by anesthesia, then transferred to ICU.  PCCM called for ventilator management.    SUBJECTIVE:  Appears comfortable VITAL SIGNS: Blood Pressure 125/71   Pulse 67   Temperature 98.1 F (36.7 C) (Oral)   Respiration 15   Height 5\' 9"  (1.753 m)   Weight 171 lb 11.8 oz (77.9 kg)   Oxygen Saturation 100%   Body Mass Index 25.36 kg/m    HEMODYNAMICS:    VENTILATOR SETTINGS: Vent Mode: PRVC FiO2 (%):  [50 %] 50 % Set Rate:  [15 bmp] 15 bmp Vt Set:  [570 mL] 570 mL PEEP:  [5 cmH20] 5 cmH20  INTAKE  / OUTPUT: I/O last 3 completed shifts: In: 97.3 [I.V.:97.3] Out: 1150 [Urine:1150]  PHYSICAL EXAMINATION: General: Is a 67 year old white male currently awake and interactive on the ventilator.  He is in no distress.   HEENT: Normocephalic atraumatic no jugular venous distention he is orally intubated Pulmonary: Bibasilar rales, no accessory use Cardiac: Soft systolic murmur.  Regular rate Abdomen: Soft complaining of abdominal cramping.  Otherwise good bowel sounds Extremities: Cool, not mottled, strong pulses. Neuro: Awake, interactive, appropriate  LABS:  BMET Recent Labs  Lab 09-30-18 0200 09-30-18 0739  NA 134* 134*  K 3.7 3.6  CL 100* 98*  CO2 23 24  BUN 26* 20  CREATININE 0.99 0.60*  GLUCOSE 110* 103*    Electrolytes Recent Labs  Lab 09-30-18 0200 09-30-18 0739  CALCIUM 8.7* 8.2*    CBC Recent Labs  Lab 09-30-18 0200 09-30-18 0739  WBC 14.7* 11.7*  HGB 10.1* 10.1*  HCT 33.7* 33.4*  PLT 195 205    Coag's Recent Labs  Lab 01/20/18 09-30-18 0200  APTT  --  34  INR 3.6 3.56    Sepsis Markers Recent Labs  Lab 09-30-18 0739  PROCALCITON <0.10    ABG Recent Labs  Lab 09-30-18 0448 09-30-18 0518 09-30-18 0806  PHART 7.298* 7.461* 7.417  PCO2ART 39.2 37.3 41.8  PO2ART 236.0* 432.0* 202.0*    Liver Enzymes No results for input(s): AST, ALT, ALKPHOS, BILITOT, ALBUMIN in the last 168 hours.  Cardiac  Enzymes Recent Labs  Lab 02/07/2018 0200  TROPONINI 1.04*    Glucose Recent Labs  Lab 07-Feb-2018 0242 Feb 07, 2018 0801  GLUCAP 95 106*    Imaging Portable Chest X-ray  Result Date: 02-07-2018 CLINICAL DATA:  Evaluate ETT EXAM: PORTABLE CHEST 1 VIEW COMPARISON:  02-07-18 FINDINGS: The ETT terminates in the mid trachea in good position. No pneumothorax. Mild cardiomegaly. The hila and mediastinum are unchanged. No pulmonary nodules or masses. No focal infiltrates. No overt edema. IMPRESSION: The ETT is in good position.  No other  interval change. Electronically Signed   By: Gerome Sam III M.D   On: 02/07/18 07:10   Dg Chest Portable 1 View  Result Date: 2018/02/07 CLINICAL DATA:  Chest pain and dyspnea tonight EXAM: PORTABLE CHEST 1 VIEW COMPARISON:  12/10/2017 FINDINGS: Cardiomegaly with minimal aortic atherosclerosis. Status post median sternotomy with post CABG change aortic valvular replacement. Elevated right hemidiaphragm, chronic in appearance with right basilar atelectasis. No pneumothorax or effusion. No acute osseous abnormality. IMPRESSION: 1. Stable cardiomegaly with post CABG change and aortic valvular replacement. 2. Streaky right basilar atelectasis with chronic mild elevation of the right hemidiaphragm. 3. No pneumothorax, effusion or CHF. Electronically Signed   By: Tollie Eth M.D.   On: Feb 07, 2018 02:22   STUDIES:  4/13 [facility] >>  CULTURES: none  ANTIBIOTICS: none  SIGNIFICANT EVENTS: 4/13  Admit/ LHC  LINES/TUBES: PIV  4/13 R femoral sheath >> 4/13 ETT >>  DISCUSSION: 15 yoM with PMH of lupus anticoagulant on coumadin, severe CAD/ AS s/p CABG x 4 and AVR in 10/2017 with complications of pericarditis since, presenting with one week hx of CP, SOB presenting with acute NSTEMI taken emergently to cath lab  ASSESSMENT / PLAN:   Acute hypoxic respiratory failure Endotracheal tube in satisfactory position.  Low volume film with slightly elevated right hemithorax as well as cardiomegaly.  No clear pulmonary edema on follow-up film Plan Continue to wean FiO2 PAD protocol RASS goal -2 Lasix as tolerated Repeat chest x-ray a.m. We will try T-bar trial, like to make sure he can tolerate weaning off from positive pressure if he looks good after 30 minutes of T-bar we can extubate (spoken with heart failure team)  NSTEMI with acute systolic dysfunction Restrictive pericarditis/ Dresslers  Mod MR Hx CAD/ AS s/p CABG x 4 and AVR w/bioprosthetic valve 10/2017, HTN, PVD Plan Cont tele MAP goal  > 65 Trend CEs Cont pred/NSAIDs/colchicine per cards   At risk for AKI Plan Trend intake and output F/u am chemistry  Ensure hemodynamics   Hx GERD, barrett's esophagus  Abd distention - bladder distention plan ppi NPO    Supra therapeutic INR on warfarin - INR 3.5 At risk for bleeding Hx lupus anticoagulant  Plan Trend coags No role for reversal given no active bleeding Hold off on sheath removal until INR closer to 2    Mild hyperglycemia - steroid induced   Plan ssi      FAMILY  - Updates: no family at bedside.    - Inter-disciplinary family meet or Palliative Care meeting due by:  4/13   My critical care time 35 minutes Simonne Martinet ACNP-BC [facility] Pulmonary/Critical Care Pager # [phone] OR # [phone] if no answer

## 2018-01-25 NOTE — Anesthesia Procedure Notes (Signed)
Procedure Name: Intubation Date/Time: 01/13/2018 5:15 AM Performed by: Claudina LickMahony, Aeralyn Barna D, CRNA Pre-anesthesia Checklist: Patient identified, Emergency Drugs available, Suction available, Patient being monitored and Timeout performed Patient Re-evaluated:Patient Re-evaluated prior to induction Oxygen Delivery Method: Ambu bag Preoxygenation: Pre-oxygenation with 100% oxygen Induction Type: IV induction, Rapid sequence and Cricoid Pressure applied Laryngoscope Size: Glidescope Grade View: Grade I Tube type: Subglottic suction tube Tube size: 7.5 mm Number of attempts: 1 Airway Equipment and Method: Stylet Placement Confirmation: ETT inserted through vocal cords under direct vision,  CO2 detector and breath sounds checked- equal and bilateral Secured at: 22 cm Tube secured with: Tape Dental Injury: Teeth and Oropharynx as per pre-operative assessment

## 2018-01-25 NOTE — Progress Notes (Signed)
ANTICOAGULATION CONSULT NOTE - Initial Consult  Pharmacy Consult for warfarin Indication: lupus AC  Allergies  Allergen Reactions  . Sulfa Antibiotics Other (See Comments)    Makes patient jittery, prefers not to take them.     Patient Measurements: Height: 5\' 9"  (175.3 cm) Weight: 171 lb 11.8 oz (77.9 kg) IBW/kg (Calculated) : 70.7  Vital Signs: Temp: 97.8 F (36.6 C) (04/13 0615) Temp Source: Oral (04/13 0615) BP: 137/107 (04/13 0543) Pulse Rate: 71 (04/13 0645)  Labs: Recent Labs    02/04/2018 0200  HGB 10.1*  HCT 33.7*  PLT 195  APTT 34  LABPROT 35.4*  INR 3.56  CREATININE 0.99  TROPONINI 1.04*    Estimated Creatinine Clearance: 73.4 mL/min (by C-G formula based on SCr of 0.99 mg/dL).   Medical History: Past Medical History:  Diagnosis Date  . Anemia   . AS (aortic stenosis)    Mild  . Barrett's esophagus 11/11/2014  . CAD S/P percutaneous coronary angioplasty    7 stents per the patient (no record).  Stent to 90% inferior branch of an OM and mild scattered plaque [facility] other [facility]  [facility].  Cath 2016 with patent stents. He did have 70% stenosis in a small LAD that was about 2 mm. Size  . Coronary artery disease involving coronary bypass graft of native heart with angina pectoris (HCC) 06/27/2017  . Essential hypertension   . GERD (gastroesophageal reflux disease)   . Glaucoma   . History of hiatal hernia   . Hypercoagulable state (HCC)   . Hyperlipidemia   . Lupus (HCC)   . Peripheral vascular disease (HCC)    PE  dvt   hx 12-14 yrs  . Pneumonia    hx  . Pneumonitis    Lupus  . S/P aortic valve replacement with bioprosthetic valve 10/17/2017   25 mm [employer] Intuity Elite rapid-deployment stented bovine pericardial tissue valve  . S/P CABG x 4 10/17/2017   LIMA to LAD w/ endarterectomy of LAD, SVG to D1, SVG to OM, SVG to RCA, EVH via bilateral thighs  . Severe aortic stenosis   . Uses hearing aid    Assessment: 67 year old male  admitted with NSTEMI/occluded graft. Cath early this am, unable to open occluded RCA.   Patient is on chronic warfarin for lupus anticoagulant. INR was 3.5 on admit, patient proceeded to cath. Arterial sheath sutured in place with orders to remove once INR drifts down. Pharmacy to start back warfarin once INR down and sheath has been removed. Will continue to follow daily INR for now.   Goal of Therapy:  INR 2-3 Monitor platelets by anticoagulation protocol: Yes   Plan:  Daily INR Restart warfarin as able  [name] PharmD., BCPS Clinical Pharmacist 02/02/2018 12:43 PM    [name], [name] 02/11/2018,7:24 AM

## 2018-01-25 NOTE — Transfer of Care (Signed)
Immediate Anesthesia Transfer of Care Note  Patient: [name]  Procedure(s) Performed: AN AD HOC INTUBATION  Patient Location: Cath Lab  Anesthesia Type:General  Level of Consciousness: sedated  Airway & Oxygen Therapy: Patient remains intubated per anesthesia plan and Patient placed on Ventilator (see vital sign flow sheet for setting)  Post-op Assessment: Report given to RN and Post -op Vital signs reviewed and stable  Post vital signs: Reviewed and stable  Last Vitals:  Vitals Value Taken Time  BP    Temp    Pulse    Resp    SpO2      Last Pain:  Vitals:   09/26/2018 0531  TempSrc:   PainSc: Asleep         Complications: No apparent anesthesia complications

## 2018-01-25 NOTE — ED Provider Notes (Signed)
[facility] CARDIAC CATH LAB Provider Note   CSN: [id number] Arrival date & time: 02/10/18  0145     History   Chief Complaint Chief Complaint  Patient presents with  . Chest Pain    HPI [name] is a 67 y.o. male with a hx of, aortic stenosis, Barrett's esophagus, coronary artery disease (status post angioplasty on 11/11/2014 with numerous stent placements and subsequent CABG on 10/17/2017), GERD, hypertension, lupus, peripheral vascular, pneumonitis disease presents to the Emergency Department complaining of gradual, persistent, progressively worsening central chest pain onset last week with significant and acute worsening at 6 PM tonight.  At that time he developed associated nausea, pallor and significant diaphoresis.  Additionally he has severe shortness of breath.  Patient reports he was recently diagnosed with pericarditis and started on prednisone, ibuprofen and colchicine.  He reports taking these as directed.  It was given aspirin 325 and 1 nitro by to arrival with some improvement in his chest pain.  Meeting factors.  Patient reports he was not exerting himself when this started.  Patient is followed by Dr. Antoine Poche and Dr. Diona Fanti.   The history is provided by the patient and medical records. No language interpreter was used.    Past Medical History:  Diagnosis Date  . Anemia   . AS (aortic stenosis)    Mild  . Barrett's esophagus 11/11/2014  . CAD S/P percutaneous coronary angioplasty    7 stents per the patient (no record).  Stent to 90% inferior branch of an OM and mild scattered plaque in other vessels [facility]  01/08/13.  Cath 2016 with patent stents. He did have 70% stenosis in a small LAD that was about 2 mm. Size  . Coronary artery disease involving coronary bypass graft of native heart with angina pectoris (HCC) 06/27/2017  . Essential hypertension   . GERD (gastroesophageal reflux disease)   . Glaucoma   . History of hiatal hernia   .  Hypercoagulable state (HCC)   . Hyperlipidemia   . Lupus (HCC)   . Peripheral vascular disease (HCC)    PE  dvt   hx 12-14 yrs  . Pneumonia    hx  . Pneumonitis    Lupus  . S/P aortic valve replacement with bioprosthetic valve 10/17/2017   25 mm Edwards Intuity Elite rapid-deployment stented bovine pericardial tissue valve  . S/P CABG x 4 10/17/2017   LIMA to LAD w/ endarterectomy of LAD, SVG to D1, SVG to OM, SVG to RCA, EVH via bilateral thighs  . Severe aortic stenosis   . Uses hearing aid     Patient Active Problem List   Diagnosis Date Noted  . Non-STEMI (non-ST elevated myocardial infarction) (HCC) 10-Feb-2018  . Dressler's syndrome (HCC) 01/20/2018  . Moderate mitral regurgitation 01/20/2018  . Pericardial friction rub 01/07/2018  . Chest pain 01/07/2018  . Fever and chills 11/05/2017  . S/P aortic valve replacement with bioprosthetic valve + CABG x4 10/17/2017  . S/P CABG x 4 10/17/2017  . Right leg pain 10/09/2017  . Pseudoaneurysm (HCC) 10/04/2017  . Severe aortic stenosis   . Unstable angina (HCC) 09/27/2017  . Long term (current) use of anticoagulants 06/26/2017  . Essential hypertension   . Barrett's esophagus 11/11/2014  . Encounter for therapeutic drug monitoring 03/25/2014  . Lung involvement in systemic lupus erythematosus (HCC) 01/20/2014  . Dyspnea 01/20/2014  . Lupus anticoagulant with hypercoagulable state (HCC) 01/14/2014  . Dyslipidemia 01/14/2014  . CAD S/P percutaneous  coronary angioplasty: reports 7 stents 01/14/2014  . Multiple joint pain 01/14/2014  . Glaucoma 01/14/2014  . GERD (gastroesophageal reflux disease) 01/14/2014  . Allergic rhinitis 01/14/2014  . Osteoarthritis 01/14/2014    Past Surgical History:  Procedure Laterality Date  . AORTIC VALVE REPLACEMENT N/A 10/17/2017   Procedure: AORTIC VALVE REPLACEMENT (AVR),CORONARY ARTERY BY-PASS GRAFTING,TEE;  Surgeon: Purcell Nails, MD;  Location: [facility] OR;  Service: Open Heart Surgery;   Laterality: N/A;  . CARDIAC CATHETERIZATION N/A 04/01/2015   Procedure: Left Heart Cath and Coronary Angiography;  Surgeon: Marykay Lex, MD;  Location: [facility] INVASIVE CV LAB;  Service: Cardiovascular;  Laterality: N/A;  . CATARACT EXTRACTION     x 2  . COLONOSCOPY    . CORONARY ARTERY BYPASS GRAFT N/A 10/17/2017   Procedure: CORONARY ARTERY BYPASS GRAFTING (CABG) time four using right and left saphaneous vien, harvested endoscopicly. and Left internal mammary artery. Coronary endartorectomy times one.;  Surgeon: Purcell Nails, MD;  Location: [facility] OR;  Service: Open Heart Surgery;  Laterality: N/A;  . EYE SURGERY    . KNEE SURGERY Right    x2 tendon  . NASAL SINUS SURGERY    . TEE WITHOUT CARDIOVERSION N/A 10/17/2017   Procedure: TRANSESOPHAGEAL ECHOCARDIOGRAM (TEE);  Surgeon: Purcell Nails, MD;  Location: [facility] OR;  Service: Open Heart Surgery;  Laterality: N/A;  . TONSILLECTOMY AND ADENOIDECTOMY    . UPPER GASTROINTESTINAL ENDOSCOPY          Home Medications    Prior to Admission medications   Medication Sig Start Date End Date Taking? Authorizing Provider  acetaminophen (TYLENOL) 325 MG tablet Take 2 tablets (650 mg total) by mouth every 4 (four) hours as needed for headache or mild pain. 10/04/17   Abelino Derrick, PA-C  aspirin 81 MG chewable tablet Chew 1 tablet (81 mg total) by mouth daily. 10/05/17   Abelino Derrick, PA-C  Azelastine HCl 0.15 % SOLN USE 2 SPRAYS BY NASAL ROUTE AS NEEDED FOR CONGESTION 03/21/17   [provider]  brimonidine (ALPHAGAN) 0.15 % ophthalmic solution Place 1 drop into both eyes 2 (two) times daily.    [provider]  calcium carbonate (TUMS - DOSED IN MG ELEMENTAL CALCIUM) 500 MG chewable tablet Chew 0.5 tablets by mouth as needed for indigestion or heartburn.    [provider]  colchicine 0.6 MG tablet Take 1 tablet (0.6 mg total) by mouth daily. 01/20/18   Kilroy, Eda Paschal, PA-C  Emollient (GOLD BOND ULTIMATE HEALING) CREA Apply 1  application topically daily as needed (wound care).    [provider]  Flaxseed, Linseed, (FLAX SEED OIL) 1000 MG CAPS Take 1,000 mg by mouth 2 (two) times daily.    [provider]  ibuprofen (ADVIL,MOTRIN) 800 MG tablet Take 1 tablet (800 mg total) by mouth 2 (two) times daily for 5 days. 01/20/18 02-13-2018  Abelino Derrick, PA-C  loperamide (IMODIUM A-D) 2 MG tablet Take 2-4 mg by mouth 2 (two) times daily as needed for diarrhea or loose stools.    [provider]  metoprolol tartrate (LOPRESSOR) 25 MG tablet TAKE 0.5 TABLETS BY MOUTH 2 TIMES DAILY 07/29/17   Rollene Rotunda, MD  NASONEX 50 MCG/ACT nasal spray Place 2 sprays into the nose daily as needed (allergies).  12/29/13   [provider]  nitroGLYCERIN (NITROSTAT) 0.4 MG SL tablet Place 1 tablet (0.4 mg total) under the tongue every 5 (five) minutes as needed for chest pain. 09/26/17 12/25/17  Barrett, Joline Salt, PA-C  Omega-3 Fatty Acids (FISH OIL PO) Take 2 capsules by mouth daily.    [provider]  pantoprazole (PROTONIX) 40 MG tablet Take 1 tablet (40 mg total) by mouth daily. 10/05/17   Abelino Derrick, PA-C  predniSONE (DELTASONE) 10 MG tablet Take 6 tablets (60 mg total) by mouth daily with breakfast for 14 days, THEN 5 tablets (50 mg total) daily with breakfast for 14 days, THEN 4 tablets (40 mg total) daily with breakfast for 14 days, THEN 3 tablets (30 mg total) daily with breakfast for 14 days, THEN 2 tablets (20 mg total) daily with breakfast for 14 days, THEN 1 tablet (10 mg total) daily with breakfast for 14 days. 01/07/18 04/01/18  Rollene Rotunda, MD  ramipril (ALTACE) 5 MG capsule TAKE ONE CAPSULE BY MOUTH DAILY 06/25/17   Nafziger, Kandee Keen, NP  timolol (BETIMOL) 0.5 % ophthalmic solution Place 1 drop into both eyes 2 (two) times daily.    [provider]  traMADol (ULTRAM) 50 MG tablet Take 1 tablet (50 mg total) by mouth every 4 (four) hours as needed for moderate pain. 01/20/18    Abelino Derrick, PA-C  Turmeric Curcumin 500 MG CAPS Take 500 mg by mouth 2 (two) times daily.    [provider]  warfarin (COUMADIN) 4 MG tablet Take 1 tablet (4 mg total) by mouth daily at 6 PM. 10/23/17   Barrett, Rae Roam, PA-C    Family History Family History  Problem Relation Age of Onset  . Diabetes Mother   . Hypertension Mother   . Arthritis Mother   . Diabetes Father   . Hypertension Father   . Arthritis Father   . CAD Father 65       died age 29 of MI    Social History Social History   Tobacco Use  . Smoking status: Never Smoker  . Smokeless tobacco: Never Used  Substance Use Topics  . Alcohol use: Yes    Comment: 2 beers month?  . Drug use: No     Allergies   Sulfa antibiotics   Review of Systems Review of Systems  Constitutional: Positive for diaphoresis and fatigue. Negative for appetite change, fever and unexpected weight change.  HENT: Negative for mouth sores.   Eyes: Negative for visual disturbance.  Respiratory: Positive for shortness of breath. Negative for cough, chest tightness and wheezing.   Cardiovascular: Positive for chest pain and leg swelling.  Gastrointestinal: Negative for abdominal pain, constipation, diarrhea, nausea and vomiting.  Endocrine: Negative for polydipsia, polyphagia and polyuria.  Genitourinary: Negative for dysuria, frequency, hematuria and urgency.  Musculoskeletal: Negative for back pain and neck stiffness.  Skin: Negative for rash.  Allergic/Immunologic: Negative for immunocompromised state.  Neurological: Positive for weakness. Negative for syncope, light-headedness and headaches.  Hematological: Does not bruise/bleed easily.  Psychiatric/Behavioral: Negative for sleep disturbance. The patient is not nervous/anxious.   All other systems reviewed and are negative.    Physical Exam Updated Vital Signs BP 116/88   Pulse (!) 56   Temp (!) 97.5 F (36.4 C) (Oral)   Resp 16   Ht 5\' 9"  (1.753 m)   Wt 76.2  kg (168 lb)   SpO2 100%   BMI 24.81 kg/m   Physical Exam  Constitutional: He appears well-developed and well-nourished. He appears distressed.  Awake, alert, pale, diaphoretic  HENT:  Head: Normocephalic and atraumatic.  Mouth/Throat: Oropharynx is clear and moist. No oropharyngeal exudate.  Eyes: Conjunctivae are normal.  No scleral icterus.  Neck: Normal range of motion. Neck supple.  Cardiovascular: Normal rate, regular rhythm and intact distal pulses.  Pulses:      Radial pulses are 1+ on the right side, and 1+ on the left side.  Pulmonary/Chest: Effort normal and breath sounds normal. Tachypnea noted. No respiratory distress. He has no wheezes.  Equal chest expansion Well-healing surgical incision  Abdominal: Soft. Bowel sounds are normal. He exhibits distension. He exhibits no mass. There is no tenderness. There is no rebound and no guarding.  Musculoskeletal: Normal range of motion. He exhibits no edema.  Neurological: He is alert.  Speech is clear and goal oriented Moves extremities without ataxia  Skin: Skin is warm. He is diaphoretic. There is pallor.  Psychiatric: He has a normal mood and affect.  Nursing note and vitals reviewed.    ED Treatments / Results  Labs (all labs ordered are listed, but only abnormal results are displayed) Labs Reviewed  BASIC METABOLIC PANEL - Abnormal; Notable for the following components:      Result Value   Sodium 134 (*)    Chloride 100 (*)    Glucose, Bld 110 (*)    BUN 26 (*)    Calcium 8.7 (*)    All other components within normal limits  CBC - Abnormal; Notable for the following components:   WBC 14.7 (*)    Hemoglobin 10.1 (*)    HCT 33.7 (*)    MCV 72.9 (*)    MCH 21.9 (*)    RDW 18.5 (*)    All other components within normal limits  PROTIME-INR - Abnormal; Notable for the following components:   Prothrombin Time 35.4 (*)    All other components within normal limits  BRAIN NATRIURETIC PEPTIDE - Abnormal; Notable for  the following components:   B Natriuretic Peptide 1,002.9 (*)    All other components within normal limits  TROPONIN I - Abnormal; Notable for the following components:   Troponin I 1.04 (*)    All other components within normal limits  LIPID PANEL - Abnormal; Notable for the following components:   HDL 22 (*)    All other components within normal limits  I-STAT TROPONIN, ED - Abnormal; Notable for the following components:   Troponin i, poc 1.18 (*)    All other components within normal limits  APTT  URINALYSIS, ROUTINE W REFLEX MICROSCOPIC  CBG MONITORING, ED  TYPE AND SCREEN    EKG EKG Interpretation  Date/Time:  Saturday January 25 2018 01:50:24 EDT Ventricular Rate:  86 PR Interval:  206 QRS Duration: 168 QT Interval:  387 QTC Calculation: 463 R Axis:   127 Text Interpretation:  Sinus rhythm Nonspecific intraventricular conduction delay Anterior infarct, acute (LAD) ST elevation avR inferior and lateral ST depression d/w Dr. Herbie BaltimoreHarding  Confirmed by Glynn Octaveancour, Stephen [phone]([id number]) on 02/02/2018 2:15:17 AM  EKG Interpretation  Date/Time:  Saturday January 25 2018 01:47:10 EDT Ventricular Rate:  98 PR Interval:  206 QRS Duration: 154 QT Interval:  400 QTC Calculation: 510 R Axis:   125 Text Interpretation:  Sinus rhythm with frequent and consecutive Premature ventricular complexes Non-specific intra-ventricular conduction block Abnormal ECG ST elevation avR inferior and lateral ST depression d/w Dr. Herbie BaltimoreHarding  Confirmed by Glynn Octaveancour, Stephen [phone]([id number]) on 01/23/2018 2:14:40 AM   Radiology Dg Chest Portable 1 View  Result Date: 02/01/2018 CLINICAL DATA:  Chest pain and dyspnea tonight EXAM: PORTABLE CHEST 1 VIEW COMPARISON:  12/10/2017 FINDINGS: Cardiomegaly with minimal aortic atherosclerosis. Status  post median sternotomy with post CABG change aortic valvular replacement. Elevated right hemidiaphragm, chronic in appearance with right basilar atelectasis. No pneumothorax or effusion. No  acute osseous abnormality. IMPRESSION: 1. Stable cardiomegaly with post CABG change and aortic valvular replacement. 2. Streaky right basilar atelectasis with chronic mild elevation of the right hemidiaphragm. 3. No pneumothorax, effusion or CHF. Electronically Signed   By: Tollie Eth M.D.   On: 01/16/2018 02:22    Procedures .Critical Care Performed by: Dierdre Forth, PA-C Authorized by: Dierdre Forth, PA-C   Critical care provider statement:    Critical care time (minutes):  45   Critical care time was exclusive of:  Separately billable procedures and treating other patients and teaching time   Critical care was necessary to treat or prevent imminent or life-threatening deterioration of the following conditions:  Cardiac failure   Critical care was time spent personally by me on the following activities:  Development of treatment plan with patient or surrogate, discussions with consultants, evaluation of patient's response to treatment, examination of patient, interpretation of cardiac output measurements, obtaining history from patient or surrogate, ordering and performing treatments and interventions, ordering and review of laboratory studies, ordering and review of radiographic studies, pulse oximetry, re-evaluation of patient's condition and review of old charts   I assumed direction of critical care for this patient from another provider in my specialty: no     (including critical care time)  Medications Ordered in ED Medications  0.9 %  sodium chloride infusion ( Intravenous New Bag/Given 01/17/2018 0208)  nitroGLYCERIN (NITROSTAT) SL tablet 0.4 mg ( Sublingual MAR Hold 02/08/2018 0322)  nitroGLYCERIN (NITROSTAT) 0.4 MG SL tablet (has no administration in time range)  aspirin chewable tablet 324 mg (324 mg Oral Given by EMS 02/03/2018 0215)  heparin injection 4,000 Units (4,000 Units Intravenous Given 02/08/2018 0209)  morphine 4 MG/ML injection 4 mg (4 mg Intravenous Given 01/24/2018  0224)     Initial Impression / Assessment and Plan / ED Course  I have reviewed the triage vital signs and the nursing notes.  Pertinent labs & imaging results that were available during my care of the patient were reviewed by me and considered in my medical decision making (see chart for details).  Clinical Course as of Jan 25 321  Sat Jan 25, 2018  0210 Troponin i, poc(!!): 1.18 [HM]  0215 elevated  WBC(!): 14.7 [HM]  0244 Dr. Herbie Baltimore at bedside.  Pt will be going to the Cath lab   [HM]  0244 INR: 3.56 [HM]  0244 Pt remains with 6/10 pain.  Will give morphine   [HM]    Clinical Course User Index [HM] Lucina Betty, Boyd Kerbs    Presents to the emergency department with chest pain, pallor, diaphoresis.  Left bundle branch block persistent on his EKG however worsening elevation in V2, V3 now with worsening reciprocal depression in V4, V5, V6.  Patient also with ST elevation in aVR.  STEMI called and heparin initiated.  Patient given additional nitro and several doses of morphine with persistent 6/10 pain.  Elevated troponin at 1.18.  Mild leukocytosis noted.  Cardiology consulted and patient will be taken to the Cath Lab.  No hypotension here in the emergency department.  The patient was discussed with and seen by Dr. Manus Gunning who agrees with the treatment plan.   Final Clinical Impressions(s) / ED Diagnoses   Final diagnoses:  ST elevation myocardial infarction (STEMI), unspecified artery [facility])    ED Discharge Orders  None       Lassie Demorest, Boyd Kerbs 01/13/2018 1610    Glynn Octave, MD 02/05/2018 [phone]

## 2018-01-25 NOTE — Progress Notes (Signed)
Initial Nutrition Assessment  DOCUMENTATION CODES:   Not applicable  INTERVENTION:   If unable to extubate within 24-48 hours, recommend initiation of Enteral Nutrition  Tube Feeding Recommendations:  Vital High Protein at 65 ml/hr Provides 1560 kcals, 137 g of protein and 1310 mL of free water   NUTRITION DIAGNOSIS:   Inadequate oral intake related to acute illness as evidenced by NPO status.  GOAL:   Patient will meet greater than or equal to 90% of their needs  MONITOR:   Labs, Vent status, Weight trends, I & O's  REASON FOR ASSESSMENT:   Ventilator    ASSESSMENT:   67 yo male admitted with acute NSTEMI, taken emergently to cath lab, acute respiratory failure requiring vent support Pt with recent hx of CABG x 4 and AVR on 10/17/17. Pt with hx of GERd, HTN, PVD, barretts esophagus, CAD, lupus, restrictive pericarditis/Dresslers  Patient is currently intubated on ventilator support MV: 6.9  L/min Temp (24hrs), Avg:97.8 F (36.6 C), Min:97.5 F (36.4 C), Max:98.1 F (36.7 C)  No family at bedside, unable to obtain diet and weight history  Labs: reviewed Meds reviewed  NUTRITION - FOCUSED PHYSICAL EXAM:  Unable to assess  Diet Order:  Diet NPO time specified  EDUCATION NEEDS:   Not appropriate for education at this time  Skin:  Skin Assessment: Reviewed RN Assessment  Last BM:  4/13  Height:   Ht Readings from Last 1 Encounters:  02/01/2018 5\' 9"  (1.753 m)    Weight:   Wt Readings from Last 1 Encounters:  01/14/2018 171 lb 11.8 oz (77.9 kg)    Ideal Body Weight:     BMI:  Body mass index is 25.36 kg/m.  Estimated Nutritional Needs:   Kcal:  1622 kcals  Protein:  117-156 g  Fluid:  >/= 1.6 L   Kenneth Starcherate Godric Lavell MS, RD, LDN, CNSC [phone] Pager  [phone] Weekend/On-Call Pager

## 2018-01-25 NOTE — ED Notes (Signed)
Code Stemi activated @ 0157

## 2018-01-25 NOTE — ED Notes (Signed)
Family in waiting room 

## 2018-01-26 ENCOUNTER — Other Ambulatory Visit (HOSPITAL_COMMUNITY): Payer: PPO

## 2018-01-26 ENCOUNTER — Inpatient Hospital Stay (HOSPITAL_COMMUNITY): Payer: PPO

## 2018-01-26 DIAGNOSIS — I469 Cardiac arrest, cause unspecified: Secondary | ICD-10-CM

## 2018-01-26 LAB — BRAIN NATRIURETIC PEPTIDE: B Natriuretic Peptide: 641.1 pg/mL — ABNORMAL HIGH (ref 0.0–100.0)

## 2018-01-26 LAB — CBC
HCT: 30.3 % — ABNORMAL LOW (ref 39.0–52.0)
HEMATOCRIT: 32.2 % — AB (ref 39.0–52.0)
HEMOGLOBIN: 9.8 g/dL — AB (ref 13.0–17.0)
Hemoglobin: 9.1 g/dL — ABNORMAL LOW (ref 13.0–17.0)
MCH: 21.6 pg — ABNORMAL LOW (ref 26.0–34.0)
MCH: 22.1 pg — ABNORMAL LOW (ref 26.0–34.0)
MCHC: 30 g/dL (ref 30.0–36.0)
MCHC: 30.4 g/dL (ref 30.0–36.0)
MCV: 71.8 fL — ABNORMAL LOW (ref 78.0–100.0)
MCV: 72.7 fL — AB (ref 78.0–100.0)
PLATELETS: 167 10*3/uL (ref 150–400)
Platelets: 220 10*3/uL (ref 150–400)
RBC: 4.22 MIL/uL (ref 4.22–5.81)
RBC: 4.43 MIL/uL (ref 4.22–5.81)
RDW: 18.4 % — AB (ref 11.5–15.5)
RDW: 18.9 % — ABNORMAL HIGH (ref 11.5–15.5)
WBC: 9.3 10*3/uL (ref 4.0–10.5)
WBC: 13.2 10*3/uL — ABNORMAL HIGH (ref 4.0–10.5)

## 2018-01-26 LAB — BASIC METABOLIC PANEL
Anion gap: 10 (ref 5–15)
BUN: 21 mg/dL — AB (ref 6–20)
CALCIUM: 7.9 mg/dL — AB (ref 8.9–10.3)
CO2: 28 mmol/L (ref 22–32)
Chloride: 98 mmol/L — ABNORMAL LOW (ref 101–111)
Creatinine, Ser: 0.71 mg/dL (ref 0.61–1.24)
GFR calc Af Amer: >60 mL/min (ref >60)
GLUCOSE: 120 mg/dL — AB (ref 65–99)
Potassium: 3.9 mmol/L (ref 3.5–5.1)
SODIUM: 136 mmol/L (ref 135–145)

## 2018-01-26 LAB — COMPREHENSIVE METABOLIC PANEL
ALBUMIN: 2.8 g/dL — AB (ref 3.5–5.0)
ALT: 34 U/L (ref 17–63)
ANION GAP: 19 — AB (ref 5–15)
AST: 46 U/L — ABNORMAL HIGH (ref 15–41)
Alkaline Phosphatase: 26 U/L — ABNORMAL LOW (ref 38–126)
BILIRUBIN TOTAL: 1.3 mg/dL — AB (ref 0.3–1.2)
BUN: 25 mg/dL — ABNORMAL HIGH (ref 6–20)
CHLORIDE: 97 mmol/L — AB (ref 101–111)
CO2: 28 mmol/L (ref 22–32)
Calcium: 7.4 mg/dL — ABNORMAL LOW (ref 8.9–10.3)
Creatinine, Ser: 1.03 mg/dL (ref 0.61–1.24)
GFR calc Af Amer: >60 mL/min (ref >60)
GFR calc non Af Amer: >60 mL/min (ref >60)
GLUCOSE: 209 mg/dL — AB (ref 65–99)
POTASSIUM: 5.3 mmol/L — AB (ref 3.5–5.1)
SODIUM: 144 mmol/L (ref 135–145)
TOTAL PROTEIN: 5.7 g/dL — AB (ref 6.5–8.1)

## 2018-01-26 LAB — LACTATE DEHYDROGENASE: LDH: 240 U/L — ABNORMAL HIGH (ref 98–192)

## 2018-01-26 LAB — GLUCOSE, CAPILLARY
GLUCOSE-CAPILLARY: 123 mg/dL — AB (ref 65–99)
Glucose-Capillary: 112 mg/dL — ABNORMAL HIGH (ref 65–99)
Glucose-Capillary: 107 mg/dL — ABNORMAL HIGH (ref 65–99)

## 2018-01-26 LAB — PROTIME-INR
INR: 2.81
PROTHROMBIN TIME: 29.3 s — AB (ref 11.4–15.2)

## 2018-01-26 LAB — LACTIC ACID, PLASMA: LACTIC ACID, VENOUS: 8.1 mmol/L — AB (ref 0.5–1.9)

## 2018-01-26 MED ORDER — ASPIRIN 325 MG PO TABS
650.0000 mg | ORAL_TABLET | Freq: Two times a day (BID) | ORAL | Status: DC
Start: 2018-01-26 — End: 2018-01-26
  Administered 2018-01-26: 650 mg via ORAL
  Filled 2018-01-26: qty 2

## 2018-01-26 MED ORDER — POTASSIUM CHLORIDE 20 MEQ PO PACK
40.0000 meq | PACK | Freq: Once | ORAL | Status: AC
  Administered 2018-01-26: 40 meq
  Filled 2018-01-26: qty 2

## 2018-01-26 MED ORDER — MORPHINE SULFATE (PF) 2 MG/ML IV SOLN
1.0000 mg | INTRAVENOUS | Status: DC | PRN
  Administered 2018-01-26: 2 mg via INTRAVENOUS
  Filled 2018-01-26: qty 1

## 2018-01-26 MED ORDER — SODIUM CHLORIDE 0.9 % IV BOLUS
250.0000 mL | Freq: Once | INTRAVENOUS | Status: AC
  Administered 2018-01-26: 250 mL via INTRAVENOUS

## 2018-01-26 MED ORDER — NOREPINEPHRINE BITARTRATE 1 MG/ML IV SOLN
0.0000 ug/min | INTRAVENOUS | Status: DC
  Filled 2018-01-26: qty 16

## 2018-01-26 MED ORDER — EPINEPHRINE PF 1 MG/ML IJ SOLN
0.5000 ug/min | INTRAVENOUS | Status: DC
  Filled 2018-01-26: qty 4

## 2018-01-26 MED ORDER — DOPAMINE-DEXTROSE 3.2-5 MG/ML-% IV SOLN: 0.0000 ug/kg/min | INTRAVENOUS | Status: DC

## 2018-01-27 ENCOUNTER — Encounter (HOSPITAL_COMMUNITY): Payer: Self-pay | Admitting: Cardiology

## 2018-01-28 ENCOUNTER — Ambulatory Visit (HOSPITAL_COMMUNITY): Payer: PPO

## 2018-01-28 MED FILL — Nitroglycerin IV Soln 100 MCG/ML in D5W: INTRA_ARTERIAL | Qty: 10 | Status: AC

## 2018-01-28 MED FILL — Heparin Sodium (Porcine) 2 Unit/ML in Sodium Chloride 0.9%: INTRAMUSCULAR | Qty: 1000 | Status: AC

## 2018-01-29 MED FILL — Medication: Qty: 1 | Status: AC

## 2018-02-03 ENCOUNTER — Ambulatory Visit (HOSPITAL_COMMUNITY): Payer: PPO

## 2018-02-04 ENCOUNTER — Ambulatory Visit: Payer: PPO | Admitting: Cardiology

## 2018-02-05 ENCOUNTER — Ambulatory Visit (HOSPITAL_COMMUNITY): Payer: PPO

## 2018-02-07 ENCOUNTER — Ambulatory Visit (HOSPITAL_COMMUNITY): Payer: PPO

## 2018-02-10 ENCOUNTER — Ambulatory Visit (HOSPITAL_COMMUNITY): Payer: PPO

## 2018-02-12 ENCOUNTER — Ambulatory Visit (HOSPITAL_COMMUNITY): Payer: PPO

## 2018-02-12 NOTE — Code Documentation (Signed)
  Patient Name: [name]   MRN: [id number]   Date of Birth/ Sex: 07-15-1951 , male      Admission Date: 01/14/2018  Attending Provider: Marykay LexHarding, Pearl W, MD  Primary Diagnosis: NSTEMI (non-ST elevated myocardial infarction) [facility](HCC)   Indication: Pt was in his usual state of health until this PM, when he was noted to not look well and found to be pulseless. Code blue was subsequently called. At the time of arrival on scene, ACLS protocol was underway.   Technical Description:  - CPR performance duration:  yes minutes  - Was defibrillation or cardioversion used? No   - Was external pacer placed? No  - Was patient intubated pre/post CPR? Yes   Medications Administered: Y = Yes; Blank = No Amiodarone    Atropine    Calcium    Epinephrine  Y  Lidocaine    Magnesium    Norepinephrine  Y  Phenylephrine    Sodium bicarbonate  Y  Vasopressin     Post CPR evaluation:  - Final Status - Was patient successfully resuscitated ? Yes - What is current rhythm? Sinus rhythm - What is current hemodynamic status? Stable with intermittent hypotension  Miscellaneous Information:  - Labs sent, including: CMET, CBC, lactate  - Primary team notified?  Yes  - Family Notified? Yes  - Additional notes/ transfer status:  central line being placed currently     [name], Kenneth Lykens C, MD  2018/05/10, 3:28 PM

## 2018-02-12 NOTE — Plan of Care (Signed)
  Problem: Education: Goal: Understanding of cardiac disease, CV risk reduction, and recovery process will improve Outcome: Progressing Goal: Understanding of medication regimen will improve Outcome: Progressing   Problem: Cardiac: Goal: Ability to achieve and maintain adequate cardiopulmonary perfusion will improve Outcome: Progressing Goal: Vascular access site(s) Level 0-1 will be maintained Outcome: Progressing   Problem: Health Behavior/Discharge Planning: Goal: Ability to safely manage health-related needs after discharge will improve Outcome: Progressing   Problem: Education: Goal: Knowledge of General Education information will improve Outcome: Progressing   Problem: Health Behavior/Discharge Planning: Goal: Ability to manage health-related needs will improve Outcome: Progressing   Problem: Clinical Measurements: Goal: Ability to maintain clinical measurements within normal limits will improve Outcome: Progressing Goal: Will remain free from infection Outcome: Progressing Goal: Diagnostic test results will improve Outcome: Progressing Goal: Respiratory complications will improve Outcome: Progressing Goal: Cardiovascular complication will be avoided Outcome: Progressing   Problem: Pain Managment: Goal: General experience of comfort will improve Outcome: Progressing   Problem: Safety: Goal: Ability to remain free from injury will improve Outcome: Progressing   Problem: Skin Integrity: Goal: Risk for impaired skin integrity will decrease Outcome: Progressing

## 2018-02-12 NOTE — Procedures (Signed)
Central Venous Catheter Insertion Procedure Note Chipper HerbDavid T Osoria [id number] Apr 03, 1951  Procedure: Insertion of Central Venous Catheter Indications: Assessment of intravascular volume, Drug and/or fluid administration and Frequent blood sampling  Procedure Details Consent: Risks of procedure as well as the alternatives and risks of each were explained to the (patient/caregiver).  Consent for procedure obtained. Time Out: Verified patient identification, verified procedure, site/side was marked, verified correct patient position, special equipment/implants available, medications/allergies/relevent history reviewed, required imaging and test results available.  Performed  Maximum sterile technique was used including antiseptics, cap, gloves, gown, hand hygiene, mask and sheet. Skin prep: Chlorhexidine; local anesthetic administered A antimicrobial bonded/coated triple lumen catheter was placed in the right internal jugular vein using the Seldinger technique.  Evaluation Blood flow good Complications: No apparent complications Patient did tolerate procedure well. Chest X-ray ordered to verify placement.  CXR: pending.   Simonne MartinetPeter E Anadelia Kintz ACNP-BC [facility] Pulmonary/Critical Care Pager # [phone] OR # [phone] if no answer  Shelby Mattocksete E Bianna Haran 12/25/2017, 4:15 PM

## 2018-02-12 NOTE — Discharge Summary (Signed)
Advanced Heart Failure Death Summary  Death Summary   Patient ID: [name] MRN: [id number], DOB/AGE: 1951/10/05 67 y.o. Admit date: 01/19/2018 D/C date:     February 08, 2018    Primary Discharge Diagnoses:  1. CAD: Patient is s/p CABG x 4 10/19/08 with LIMA-LAD, SVG-D, SVG-D1, SVG-PDA.  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  Last night, admitted with NSTEMI.  Found to have occlusion of SVG-RCA as likely culprit, native RCA TO.  Also with 80% anastomotic lesions of SVG-OM and SVG-D.  LIMA-LAD patent but distal LAD with 80% stenosis diffusely.  Unable to open SVG-PDA last night.  2. 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 Acute on chronic systolic CHF: EF 78-29% with LV-gram.   4. Acute hypoxemic respiratory failure: Intubated in cath lab.   5. Upper GI bleeding: Blood noted in OGT.  He is at risk for gastritis/ulcer with use of prednisone and ibuprofen.  6. SLE with Antiphospholipid Ab syndrome: with h/o DVT and PE.   Hospital Course:  [name] was a 67 y.o. male with a history of known multivessel CAD with multiple stents back in 2014 from [facility]. He had a cardiac catheterization January 2019 for progressively worsening aortic valve disease and chest pain.  He was found to have severe multivessel disease and underwent CABG x4 with LIMA-LAD, SVG-RCA, SVG-D1, SVG-OM as well as pericardial AVR.  Since discharge he has had symptoms concerning for possible constrictive pericarditis and has had off-and-on chest pain that has been treated with the prednisone taper and colchicine.    Admitted with worsening chest pain.  Upon arrival to ER he was still having chest pain and continued to have chest pain and 6/10.  EKG has notably changed with widened QRS complex IVCD and ST depressions in inferior lateral leads with ST elevation in aVR as well as multiple PVCs.  His troponin is positive at 1.18.  Unfortunately his INR is also elevated at 3.5.  He was taken to cath  lab for RHC/LHC. In the cath lab he developed acute respiratory failure requiring CCM consultation for intubation. Diuresed with IV lasix with limited response. INR was supratherapeutic. Blood was noted in the OG tube so ibuprofen stopped He was placed on  protonix + carafate.   He was extubated on 4/14 without difficulty. Later that day he had chest pain and required NTG. About an hour later, he went into a PEA arrest.  HR decreased into the 40s-50s at times but he was never asystolic and no VT/VF.  Dr Shirlee Latch directed the code for about 60 minutes.  He received multiple rounds of epinephrine, atropine, HCO3.  He was intubated and CVL was placed.  He was on norepinephrine 50, epinephrine 50, and dopamine 20.  We were unable to maintain a pulse despite these measures and ongoing CPR.  Bedside ECHO was performed and showed no pericardial effusion.  There was  concern that he may have lost one of his other SVGs (severe anastomotic stenosis of SVG-OM and SVG-D).   We decided that further efforts were futile and decided to halt the code effort.  This was discussed with his wife.  He quickly lost BP and pulse and expired around 4:04 pm.   Cardiac Procedures LHC (02/01/2018): Occluded SVG-PDA (new), occluded RCA, occluded LCx.  80% anastomotic lesion of SVG-OM, 80% anastomotic lesion of SVG-D1, patent LIMA-LAD with 80% distal LAD.  No intervention.  EF 35-45% on LV-gram.    Duration of Discharge Encounter: Greater than 35 minutes   Signed,  Tonye BecketAmy Shadara Lopez, NP 02/05/2018, 12:38 PM

## 2018-02-12 NOTE — Procedures (Signed)
Intubation Procedure Note [name] [id number] 02/11/1951  Procedure: Intubation Indications: Respiratory insufficiency  Procedure Details Consent: Risks of procedure as well as the alternatives and risks of each were explained to the (patient/caregiver).  Consent for procedure obtained. Time Out: Verified patient identification, verified procedure, site/side was marked, verified correct patient position, special equipment/implants available, medications/allergies/relevent history reviewed, required imaging and test results available.  Performed  Maximum sterile technique was used including antiseptics, cap, gloves, hand hygiene and mask.  MAC and 4    Evaluation Hemodynamic Status: Transient hypotension treated with pressors and treated with pressors and fluid; O2 sats: stable throughout Patient's Current Condition: expired Complications: No apparent complications Patient did tolerate procedure well. Chest X-ray ordered to verify placement.  CXR: tube position acceptable.  Erick Colace ACNP-BC [place] Pager # [phone] OR # [phone] if no answer  Clementeen Graham Feb 21, 2018

## 2018-02-12 NOTE — Plan of Care (Signed)
Eyes prepared for CDS

## 2018-02-12 NOTE — Progress Notes (Signed)
Chaplain came to Pt.'s room by way of Code Blue. Chaplain entered Pt.'s room and held the hand of [name] wife who was sitting in the corner of the room during the code. Kenneth Routavid [employer] -Pt.'s- wife was understandably shaken and very afraid. Chaplain provided [facility]  The Chaplain and family are grateful for the steadfast care of the 2H team. The code persisted for quite some time. At two points being downgraded and resumed.  Chaplain facilitated contact with the community of faith the family is connected and [name] of [employer] attended to the family after  Mr. Kenneth RoutDavid Brilliant expired.  Family is in a good place of acceptance and reported gratitude for the care they have received.

## 2018-02-12 NOTE — Progress Notes (Addendum)
PULMONARY / CRITICAL CARE MEDICINE   Name: [name] MRN: [id number] DOB: Aug 31, 1951    ADMISSION DATE:  01/13/2018 CONSULTATION DATE:  02/05/2018  REFERRING MD:  Dr. Herbie Baltimore  CHIEF COMPLAINT:  NSTEMI  HISTORY OF PRESENT ILLNESS:   HPI obtained from medical chart review as patient is intubated and sedated on mechanical ventilation.  67 year old male with PMH of lupus anticoagulant on chronic coumadin, GERD, HTN, PVD, barrettsCAD w/stents and AS with recent CABG x 4 (LIMA-LAD, SVG-RCA, SVG-D1, SVG-OM) and AVR in 10/17/2017 per Dr. Barry Dienes.   Since surgery, patient has been having intermittent chest pain with symptoms concerning for restrictive pericarditis treated with prednisone taper and colchicine.  Presented on the morning of 4/13 with one week progressive symptoms of chest pain, diaphoresis, and SOB. In ER, patient with some pulmonary edema and peripheral edema, labs noted for BNP 1002, troponin I 1.04, WBC 14.7, PLT 195,  PT 35, INR 3.56, sCr 0.99,  EKG changed from prior with widened QRS complex IVCD and ST depression in inferior lateral leads and elevation in aVR.  He was taken emergently to cath lab per Dr. Herbie Baltimore.  A thrombectomy and balloon angioplasty was performed to his SVG- RCA.  Other grafts concerning for stenosis.  LVEDP notably reduced at 29-35%.  Nearing end of procedure, patient started to have increasing work of breathing and hypoxia requiring emergent intubation by anesthesia, then transferred to ICU.  PCCM called for ventilator management.    SUBJECTIVE:  Has passed spontaneous breathing trial VITAL SIGNS: Blood Pressure 105/69   Pulse 76   Temperature 98.4 F (36.9 C) (Oral)   Respiration 13   Height 5\' 9"  (1.753 m)   Weight 155 lb 13.8 oz (70.7 kg)   Oxygen Saturation 99%   Body Mass Index 23.02 kg/m   HEMODYNAMICS:    VENTILATOR SETTINGS: Vent Mode: PSV;CPAP FiO2 (%):  [30 %-40 %] 40 % Set Rate:  [15 bmp] 15 bmp Vt Set:  [570 mL] 570 mL PEEP:  [5  cmH20] 5 cmH20 Pressure Support:  [5 cmH20-10 cmH20] 5 cmH20 Plateau Pressure:  [8 cmH20-24 cmH20] 19 cmH20  INTAKE / OUTPUT: I/O last 3 completed shifts: In: 1187.5 [I.V.:847.5; NG/GT:90; IV Piggyback:250] Out: 6435 [Urine:6435]  PHYSICAL EXAMINATION: General: This is 67 year old white male currently resting comfortably on the bed he is in no acute distress HEENT: Normocephalic atraumatic no jugular venous distention mucous membranes are moist orally intubated Pulmonary: Clear to auscultation without accessory use.  Excellent frequency tidal volume ratio on spontaneous breathing trial Cardiac: Regular rate and rhythm without murmur rub or gallop Abdomen: Soft nontender no organomegaly Extremities: Brisk cap refill warm, dry, no significant edema Neuro/psych: Awake alert appropriate follows commands moves all extremities  LABS:  BMET Recent Labs  Lab 02/06/2018 0739 01/24/2018 2253 2018-02-04 0345  NA 134* 136 136  K 3.6 4.0 3.9  CL 98* 97* 98*  CO2 24 26 28   BUN 20 21* 21*  CREATININE 0.60* 0.72 0.71  GLUCOSE 103* 107* 120*    Electrolytes Recent Labs  Lab 01/29/2018 0739 01/15/2018 2253 Feb 04, 2018 0345  CALCIUM 8.2* 8.0* 7.9*    CBC Recent Labs  Lab 01/30/2018 0739 01/30/2018 1400 04-Feb-2018 0345  WBC 11.7* 11.9* 9.3  HGB 10.1* 10.0* 9.1*  HCT 33.4* 33.2* 30.3*  PLT 205 190 167    Coag's Recent Labs  Lab 01/20/18 01/22/2018 0200 02-04-18 0345  APTT  --  34  --   INR 3.6 3.56 2.81  Sepsis Markers Recent Labs  Lab 01/28/2018 0739  PROCALCITON <0.10    ABG Recent Labs  Lab 02/06/2018 0448 01/16/2018 0518 01/21/2018 0806  PHART 7.298* 7.461* 7.417  PCO2ART 39.2 37.3 41.8  PO2ART 236.0* 432.0* 202.0*    Liver Enzymes No results for input(s): AST, ALT, ALKPHOS, BILITOT, ALBUMIN in the last 168 hours.  Cardiac Enzymes Recent Labs  Lab 01/20/2018 0739 01/22/2018 1400 01/22/2018 2253  TROPONINI 5.66* 9.93* 7.46*    Glucose Recent Labs  Lab 02/11/2018 0801  01/20/2018 1557 01/19/2018 2012 01/23/2018 2317 2018-03-22 0414 2018-03-22 0751  GLUCAP 106* 66 81 92 112* 107*    Imaging Dg Chest Port 1 View  Result Date: April 21, 2018 CLINICAL DATA:  Acute respiratory failure. On ventilator. EXAM: PORTABLE CHEST 1 VIEW COMPARISON:  01/30/2018 FINDINGS: There has been placement of a nasogastric tube which is seen entering the stomach. Endotracheal tube remains in appropriate position. Stable mild cardiomegaly with prosthetic aortic valve. Prior CABG. Stable elevation of right hemidiaphragm with right basilar atelectasis or infiltrate. Left lung remains clear. IMPRESSION: Right basilar atelectasis or infiltrate, without significant change. New nasogastric tube in appropriate position. Electronically Signed   By: Myles RosenthalJohn  Stahl M.D.   On: 0July 08, 2019 07:17   Dg Abd Portable 1v  Result Date: 01/16/2018 CLINICAL DATA:  Encounter for orogastric tube EXAM: PORTABLE ABDOMEN - 1 VIEW COMPARISON:  None. FINDINGS: An orogastric tube tip is peri pyloric.  Normal bowel gas pattern. Cardiomegaly.  Aortic valve replacement and CABG. IMPRESSION: Peri pyloric nasogastric tube tip. Electronically Signed   By: Marnee SpringJonathon  Watts M.D.   On: 01/18/2018 16:49   STUDIES:    CULTURES: none  ANTIBIOTICS: none  SIGNIFICANT EVENTS: 4/13  Admit/ LHC  LINES/TUBES: PIV  4/13 R femoral sheath >> 4/13 ETT >> 4/14  DISCUSSION: [age] yoM with PMH of lupus anticoagulant on coumadin, severe CAD/ AS s/p CABG x 4 and AVR in 10/2017 with complications of pericarditis since, presenting with one week hx of CP, SOB presenting with acute NSTEMI taken emergently to cath lab  ASSESSMENT / PLAN:   Acute hypoxic respiratory failure in setting of flash pulmonary edema now resolved Portable chest x-ray personally reviewed this demonstrates improved aeration, does have right basilar atelectasis but otherwise unremarkable -Has passed spontaneous breathing trial Plan Extubate Wean FiO2 DC  sedation Mobilize when okay with cardiology service  NSTEMI with acute systolic dysfunction Restrictive pericarditis/ Dresslers  Mod MR Hx CAD/ AS s/p CABG x 4 and AVR w/bioprosthetic valve 10/2017, HTN, PVD Plan Continue telemetry monitoring Continuing steroids, and colchicine as well as aspirin per cardiology Cardiology to discuss with interventional cardiology regarding possible repeat left heart cath Follow-up echocardiogram   At risk for AKI Plan Strict intake output Follow-up a.m. chemistry  Hx GERD, barrett's esophagus  plan Continue PPI N.p.o. for now Reassess for readiness for p.o. intake following extubation    Supra therapeutic INR on warfarin - INR 3.5 At risk for bleeding Hx lupus anticoagulant  Plan Trending coags No role for reversal Remove sheath once INR lower   Mild hyperglycemia - steroid induced   Plan Sliding scale insulin     FAMILY  - Updates: no family at bedside.    - Inter-disciplinary family meet or Palliative Care meeting due by:  4/13  DVT prophylaxis:chronic ac  SUP: ppi  Diet: advance  Activity: BR Disposition : ICU  Critical care times 35 minutes Simonne MartinetPeter E Gleen Ripberger ACNP-BC [facility]Critical [facility] Pager # [phone] OR # [phone] if no answer

## 2018-02-12 NOTE — Progress Notes (Signed)
Patient ID: [name], male   DOB: 12/10/1950, 67 y.o.   MRN: [id number]  Patient had about 5 minutes chest pain this afternoon relieved with NTG.  He then complained of back pain and had a dose of MSO4.  About an hour later, he went into a PEA arrest.  HR decreased into the 40s-50s at times but he was never asystolic and no VT/VF.  I directed the code for about 60 minutes.  He received multiple rounds of epinephrine, atropine, HCO3.  He was intubated and CVL was placed.  He was on norepinephrine 50, epinephrine 50, and dopamine 20.  We were unable to maintain a pulse despite these measures and ongoing CPR.  I did a bedside echo, EF was about 20% with no pericardial effusion.  I am concerned that he may have lost one of his other SVGs (severe anastomotic stenosis of SVG-OM and SVG-D).   We decided that further efforts were futile and decided to halt the code effort.  I discussed this with his wife.  He quickly lost BP and pulse and expired around 4:04 pm.   CRITICAL CARE Performed by: Kenneth Anconaalton Keith  Total critical care time: 60 minutes  Critical care time was exclusive of separately billable procedures and treating other patients.  Critical care was necessary to treat or prevent imminent or life-threatening deterioration.  Critical care was time spent personally by me on the following activities: development of treatment plan with patient and/or surrogate as well as nursing, discussions with consultants, evaluation of patient's response to treatment, examination of patient, obtaining history from patient or surrogate, ordering and performing treatments and interventions, ordering and review of laboratory studies, ordering and review of radiographic studies, pulse oximetry and re-evaluation of patient's condition.  [name] 24-Jun-2018 4:14 PM

## 2018-02-12 NOTE — Death Summary Note (Signed)
1500 pt unresponsive, cyanotic had a SB on minitor but no pulse, code called. See Code Sheet. Dr Shirlee LatchMcLean here.  [facility] at [id number] [facility] myself, no audible heart tones no brathing, flat line on monitor.  1700 Morgue care done

## 2018-02-12 NOTE — Progress Notes (Signed)
Called to room emergently CPR in progress. PEA. Cardiology already in room.  Recurrent PEA arrests w/ brief response to atropine. Developed agonal respiratory efforts so emergently intubated.  See code note..cardiopulmonary resuscitation continued w/ multiple rounds of bicarb, epinephrine levophed gtt. Central access placed during short episode or ROSC in right IJ verified via US and as suturing once again developed PEA. Dr Shirlee LatchMcLean spoke w/ family. Felt due to the degree of CAD and re-stenosis that there was nothing to offer.  No further CPR advised.  Pt briefly regained ROSC and then once again was back in PEA.  Pt passed away due to cardiac arrest.  Impression Recurrent PEA arrest Coronary artery disease.  NSTEMI w/ occluded SVG-PDA Acute heart failure  Coagulopathy Respiratory arrest.   Simonne MartinetPeter E Jaylanni Eltringham ACNP-BC [facility] Pulmonary/Critical Care Pager # [phone] OR # [phone] if no answer

## 2018-02-12 NOTE — Progress Notes (Addendum)
Patient ID: [name], male   DOB: 18-Sep-1951, 67 y.o.   MRN: [id number]     Advanced Heart Failure Rounding Note  PCP-Cardiologist: Minus Breeding, MD   Subjective:    Patient admitted with NSTEMI, found to have occluded SVG-PDA as likely culprit. TnI to 9.9. LVEDP > 30, he ended up being intubated. He has had IV Lasix with good UOP, weight down.     He is intubated and awake, able to communicate with white board.  Has had pleuritic CP and dyspnea for weeks now.  Currently no chest pain, says breathing is ok on vent.    INR 2.8, femoral arterial sheath remains in place.   ESR 81 (2 wks ago) => 12 now.   No further blood from OGT, mild drop in hgb.   LHC (01/30/2018): Occluded SVG-PDA (new), occluded RCA, occluded LCx.  80% anastomotic lesion of SVG-OM, 80% anastomotic lesion of SVG-D1, patent LIMA-LAD with 80% distal LAD.  No intervention.  EF 35-45% on LV-gram.   Echo (4/19): EF 50%, bioprosthetic aortic valve looked ok, mildly dilated RV with mildly decreased systolic function, moderate-severe MR (moderate by PISA), no pericardial effusion though concern for pericardial thickening.  Respirophasic variation of IV septum concerning for constrictive pericarditis picture.    Objective:   Weight Range: 155 lb 13.8 oz (70.7 kg) Body mass index is 23.02 kg/m.   Vital Signs:   Temp:  [97.5 F (36.4 C)-99.6 F (37.6 C)] 98.4 F (36.9 C) (04/14 0700) Pulse Rate:  [58-95] 83 (04/14 0900) Resp:  [10-20] 13 (04/14 0900) BP: (86-141)/(54-106) 125/106 (04/14 0900) SpO2:  [88 %-100 %] 98 % (04/14 0900) Arterial Line BP: (96-160)/(48-104) 127/67 (04/14 0900) FiO2 (%):  [30 %-40 %] 40 % (04/14 0830) Weight:  [155 lb 13.8 oz (70.7 kg)] 155 lb 13.8 oz (70.7 kg) (04/14 0400) Last BM Date: 02/04/2018  Weight change: Filed Weights   01/15/2018 0220 01/29/2018 0615 02/08/18 0400  Weight: 168 lb (76.2 kg) 171 lb 11.8 oz (77.9 kg) 155 lb 13.8 oz (70.7 kg)    Intake/Output:   Intake/Output  Summary (Last 24 hours) at 2018-02-08 0936 Last data filed at 02/08/18 0900 Gross per 24 hour  Intake 1028.83 ml  Output 5915 ml  Net -4886.17 ml      Physical Exam    General: Intubated, awake.  Neck: JVP 10 cm, no thyromegaly or thyroid nodule.  Lungs: Mild crackles at bases.  CV: Nondisplaced PMI.  Heart regular S1/S2, no S3/S4, 3/6 HSM apex.  1+ ankle edema.    Abdomen: Soft, nontender, no hepatosplenomegaly, no distention.  Skin: Intact without lesions or rashes.  Neurologic: Alert/follows commands  Psych: Normal affect. Extremities: No clubbing or cyanosis.  HEENT: Normal.    Telemetry   NSR with PVCs (personally reviewed)  EKG    NSR, IVCD 146 msec, PVCs   Labs    CBC Recent Labs    01/17/2018 1400 2018/02/08 0345  WBC 11.9* 9.3  HGB 10.0* 9.1*  HCT 33.2* 30.3*  MCV 72.3* 71.8*  PLT 190 659   Basic Metabolic Panel Recent Labs    01/24/2018 2253 2018-02-08 0345  NA 136 136  K 4.0 3.9  CL 97* 98*  CO2 26 28  GLUCOSE 107* 120*  BUN 21* 21*  CREATININE 0.72 0.71  CALCIUM 8.0* 7.9*   Liver Function Tests No results for input(s): AST, ALT, ALKPHOS, BILITOT, PROT, ALBUMIN in the last 72 hours. No results for input(s): LIPASE, AMYLASE in the last  72 hours. Cardiac Enzymes Recent Labs    01/30/2018 0739 01/29/2018 1400 02/04/2018 2253  TROPONINI 5.66* 9.93* 7.46*    BNP: BNP (last 3 results) Recent Labs    01/24/2018 0200 01/27/2018 1512 02/22/18 0654  BNP 1,002.9* 1,288.0* 641.1*    ProBNP (last 3 results) No results for input(s): PROBNP in the last 8760 hours.   D-Dimer No results for input(s): DDIMER in the last 72 hours. Hemoglobin A1C No results for input(s): HGBA1C in the last 72 hours. Fasting Lipid Panel Recent Labs    02/08/2018 0200  CHOL 76  HDL 22*  LDLCALC 38  TRIG 82  CHOLHDL 3.5   Thyroid Function Tests No results for input(s): TSH, T4TOTAL, T3FREE, THYROIDAB in the last 72 hours.  Invalid input(s): FREET3  Other  results:   Imaging    Dg Chest Port 1 View  Result Date: Feb 22, 2018 CLINICAL DATA:  Acute respiratory failure. On ventilator. EXAM: PORTABLE CHEST 1 VIEW COMPARISON:  01/21/2018 FINDINGS: There has been placement of a nasogastric tube which is seen entering the stomach. Endotracheal tube remains in appropriate position. Stable mild cardiomegaly with prosthetic aortic valve. Prior CABG. Stable elevation of right hemidiaphragm with right basilar atelectasis or infiltrate. Left lung remains clear. IMPRESSION: Right basilar atelectasis or infiltrate, without significant change. New nasogastric tube in appropriate position. Electronically Signed   By: Earle Gell M.D.   On: Feb 22, 2018 07:17   Dg Abd Portable 1v  Result Date: 02/07/2018 CLINICAL DATA:  Encounter for orogastric tube EXAM: PORTABLE ABDOMEN - 1 VIEW COMPARISON:  None. FINDINGS: An orogastric tube tip is peri pyloric.  Normal bowel gas pattern. Cardiomegaly.  Aortic valve replacement and CABG. IMPRESSION: Peri pyloric nasogastric tube tip. Electronically Signed   By: Monte Fantasia M.D.   On: 01/31/2018 16:49     Medications:     Scheduled Medications: . aspirin  650 mg Oral BID  . atorvastatin  80 mg Oral q1800  . brimonidine  1 drop Both Eyes BID  . carvedilol  3.125 mg Oral BID WC  . chlorhexidine gluconate (MEDLINE KIT)  15 mL Mouth Rinse BID  . colchicine  0.6 mg Oral Daily  . fluticasone  2 spray Each Nare Daily  . furosemide  60 mg Intravenous BID  . insulin aspart  1-3 Units Subcutaneous Q4H  . mouth rinse  15 mL Mouth Rinse QID  . pantoprazole (PROTONIX) IV  40 mg Intravenous Q12H  . potassium chloride  40 mEq Per Tube Once  . predniSONE  50 mg Oral Q breakfast   Followed by  . [START ON 02/04/2018] predniSONE  40 mg Oral Q breakfast   Followed by  . [START ON 02/18/2018] predniSONE  30 mg Oral Q breakfast   Followed by  . [START ON 03/04/2018] predniSONE  20 mg Oral Q breakfast   Followed by  . [START ON  03/18/2018] predniSONE  10 mg Oral Q breakfast  . ramipril  5 mg Oral Daily  . sodium chloride flush  3 mL Intravenous Q12H  . sucralfate  1 g Oral TID WC & HS  . timolol  1 drop Both Eyes BID    Infusions: . sodium chloride Stopped (01/18/2018 2000)  . sodium chloride    . dextrose 10 mL/hr at 02-22-18 0900  . fentaNYL infusion INTRAVENOUS 25 mcg/hr (22-Feb-2018 0900)    PRN Medications: sodium chloride, acetaminophen, bisacodyl, docusate, fentaNYL, ipratropium-albuterol, loperamide, midazolam, midazolam, nitroGLYCERIN, ondansetron (ZOFRAN) IV, sodium chloride flush    Assessment/Plan  1. CAD: Patient is s/p CABG x 4 10/19/08 with LIMA-LAD, SVG-D, SVG-D1, SVG-PDA.  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  Last night, admitted with NSTEMI.  Found to have occlusion of SVG-RCA as likely culprit, native RCA TO.  Also with 80% anastomotic lesions of SVG-OM and SVG-D.  LIMA-LAD patent but distal LAD with 80% stenosis diffusely.  Unable to open SVG-PDA.  It is possible that inflammation from post-pericardiotomy syndrome drove early graft disease.   - Will need to review with interventional colleagues.  Would likely not intervene on SVG-D given small D, would consider intervention on SVG-OM. ?Consider intervention on CTO RCA.  This is complicated by need for anticoagulation for anti-phospholipid Ab syndrome as well as treatment of the pericarditis.  - Continue atorvastatin 80 mg daily (watch for any evidence of myopathy with colchicine).  - Resume ASA at treatment dose for pericarditis.  - INR 2.8. Arterial sheath will remain in until this trends down.  2. 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  Most recent echo was concerning for development of constrictive physiology.  He has been on prednisone + colchicine + Ibuprofen. ESR has fallen from 81 2 wks ago to 12 today (was started on steroids). No chest pain currently.  - Would stop ibuprofen with  MI.  No further blood in OGT, hgb fairly stable.  Will give ASA 650 mg bid for now.  - Prednisone is a concern with NSTEMI.  However, he has had symptoms concerning for intractable post-pericardiotomy syndrome.  He is on prednisone taper currently, will continue for now.  - Continue colchicine 0.6 daily.  - Will need echo this admission to reassess EF, also to assess for constrictive pericarditis => needs respirometry.  Do echo after he is extubated. Eventually cardiac MRI and hemodynamic right/left heart cath will likely be helpful.  3. Acute on chronic systolic CHF: EF 95-18% with LV-gram.  He is volume overloaded on exam, LVEDP > 30 at cath and required intubation. He is diuresing well with IV Lasix.  - Continue Lasix 60 mg IV bid today and replace K.  - Continue home ramipril.  - Continue Coreg 3.125 mg bid.   4. Acute hypoxemic respiratory failure: Intubated in cath lab.  With diuresis, should be able to be extubated today.  5. Upper GI bleeding: Blood noted in OGT yesterday, has resolved.  He is at risk for gastritis/ulcer with use of prednisone and ibuprofen.  - Now off ibuprofen.   - Protonix 40 mg IV bid + Carafate.  - Follow CBC.  6. SLE with Antiphospholipid Ab syndrome: with h/o DVT and PE.  Supratherapeutic INR on coumadin.  Allowing it to trend down.   7. Mitral regurgitation: Significant murmur on exam, reassess with echo.   CRITICAL CARE Performed by: Loralie Champagne  Total critical care time: 35 minutes  Critical care time was exclusive of separately billable procedures and treating other patients.  Critical care was necessary to treat or prevent imminent or life-threatening deterioration.  Critical care was time spent personally by me on the following activities: development of treatment plan with patient and/or surrogate as well as nursing, discussions with consultants, evaluation of patient's response to treatment, examination of patient, obtaining history from patient or  surrogate, ordering and performing treatments and interventions, ordering and review of laboratory studies, ordering and review of radiographic studies, pulse oximetry and re-evaluation of patient's condition. Length of Stay: 1  Loralie Champagne, MD  02/12/18, 9:36 AM  Advanced Heart Failure  Team Pager [phone] (M-F; [place])  Please contact [facility] for night-coverage after hours (4p -7a ) and weekends on [email or web address]

## 2018-02-12 NOTE — Plan of Care (Signed)
1505 pt unresponsive had rhythm on monitor but no pulse pt purple code called see code sheet

## 2018-02-12 NOTE — Plan of Care (Signed)
1600 code stopped

## 2018-02-12 NOTE — Procedures (Signed)
Extubation Procedure Note  Patient Details:   Name: Chipper HerbDavid T Zucker DOB: 06-07-1951 MRN: [id number]   Airway Documentation:     Evaluation  O2 sats: stable throughout Complications: No apparent complications Patient did tolerate procedure well. Bilateral Breath Sounds: Clear   Patient extubated to 4 L [place] without incident. No distress or stridor noted. Cuff leak present prior to extubation.   [place] M Stacye Noori 02/07/2018, 11:01 AM

## 2018-02-12 NOTE — Plan of Care (Signed)
Wasted 100 cc fentanyl in sink witnessed by [employer] Corporation[employer] RN

## 2018-02-12 DEATH — deceased

## 2018-02-14 ENCOUNTER — Ambulatory Visit (HOSPITAL_COMMUNITY): Payer: PPO

## 2018-02-17 ENCOUNTER — Ambulatory Visit (HOSPITAL_COMMUNITY): Payer: PPO

## 2018-02-19 ENCOUNTER — Ambulatory Visit (HOSPITAL_COMMUNITY): Payer: PPO

## 2018-02-21 ENCOUNTER — Ambulatory Visit (HOSPITAL_COMMUNITY): Payer: PPO

## 2018-02-24 ENCOUNTER — Ambulatory Visit (HOSPITAL_COMMUNITY): Payer: PPO

## 2018-02-26 ENCOUNTER — Ambulatory Visit (HOSPITAL_COMMUNITY): Payer: PPO

## 2018-02-28 ENCOUNTER — Ambulatory Visit (HOSPITAL_COMMUNITY): Payer: PPO

## 2018-03-03 ENCOUNTER — Encounter: Payer: PPO | Admitting: Thoracic Surgery (Cardiothoracic Vascular Surgery)

## 2018-03-03 ENCOUNTER — Ambulatory Visit (HOSPITAL_COMMUNITY): Payer: PPO

## 2018-03-05 ENCOUNTER — Ambulatory Visit (HOSPITAL_COMMUNITY): Payer: PPO

## 2018-03-07 ENCOUNTER — Ambulatory Visit (HOSPITAL_COMMUNITY): Payer: PPO

## 2018-03-12 ENCOUNTER — Ambulatory Visit (HOSPITAL_COMMUNITY): Payer: PPO

## 2018-03-14 ENCOUNTER — Ambulatory Visit (HOSPITAL_COMMUNITY): Payer: PPO

## 2018-03-17 ENCOUNTER — Ambulatory Visit (HOSPITAL_COMMUNITY): Payer: PPO

## 2018-03-19 ENCOUNTER — Ambulatory Visit (HOSPITAL_COMMUNITY): Payer: PPO

## 2018-03-21 ENCOUNTER — Ambulatory Visit (HOSPITAL_COMMUNITY): Payer: PPO

## 2018-03-24 ENCOUNTER — Ambulatory Visit (HOSPITAL_COMMUNITY): Payer: PPO

## 2018-03-26 ENCOUNTER — Ambulatory Visit (HOSPITAL_COMMUNITY): Payer: PPO

## 2018-03-28 ENCOUNTER — Ambulatory Visit (HOSPITAL_COMMUNITY): Payer: PPO

## 2018-03-31 ENCOUNTER — Ambulatory Visit (HOSPITAL_COMMUNITY): Payer: PPO

## 2018-04-02 ENCOUNTER — Ambulatory Visit (HOSPITAL_COMMUNITY): Payer: PPO

## 2018-04-04 ENCOUNTER — Ambulatory Visit (HOSPITAL_COMMUNITY): Payer: PPO

## 2018-04-07 ENCOUNTER — Ambulatory Visit (HOSPITAL_COMMUNITY): Payer: PPO

## 2018-04-09 ENCOUNTER — Ambulatory Visit (HOSPITAL_COMMUNITY): Payer: PPO

## 2018-04-11 ENCOUNTER — Ambulatory Visit (HOSPITAL_COMMUNITY): Payer: PPO

## 2018-04-14 ENCOUNTER — Ambulatory Visit (HOSPITAL_COMMUNITY): Payer: PPO

## 2018-04-16 ENCOUNTER — Ambulatory Visit (HOSPITAL_COMMUNITY): Payer: PPO

## 2018-04-18 ENCOUNTER — Ambulatory Visit (HOSPITAL_COMMUNITY): Payer: PPO

## 2018-04-21 ENCOUNTER — Ambulatory Visit (HOSPITAL_COMMUNITY): Payer: PPO

## 2018-04-23 ENCOUNTER — Ambulatory Visit (HOSPITAL_COMMUNITY): Payer: PPO

## 2018-04-25 ENCOUNTER — Ambulatory Visit (HOSPITAL_COMMUNITY): Payer: PPO

## 2018-04-28 ENCOUNTER — Ambulatory Visit (HOSPITAL_COMMUNITY): Payer: PPO

## 2018-04-30 ENCOUNTER — Ambulatory Visit (HOSPITAL_COMMUNITY): Payer: PPO

## 2018-05-02 ENCOUNTER — Ambulatory Visit (HOSPITAL_COMMUNITY): Payer: PPO

## 2018-05-05 ENCOUNTER — Ambulatory Visit (HOSPITAL_COMMUNITY): Payer: PPO

## 2018-05-07 ENCOUNTER — Ambulatory Visit (HOSPITAL_COMMUNITY): Payer: PPO

## 2019-10-17 IMAGING — DX DG CHEST 1V PORT
1 series · 1 of 1 positions shown · non-contrast
Comparison: 10/19/2017 and prior radiograph

CLINICAL DATA: Status post aortic valve replacement

EXAM:
PORTABLE CHEST 1 VIEW

[chest ap]
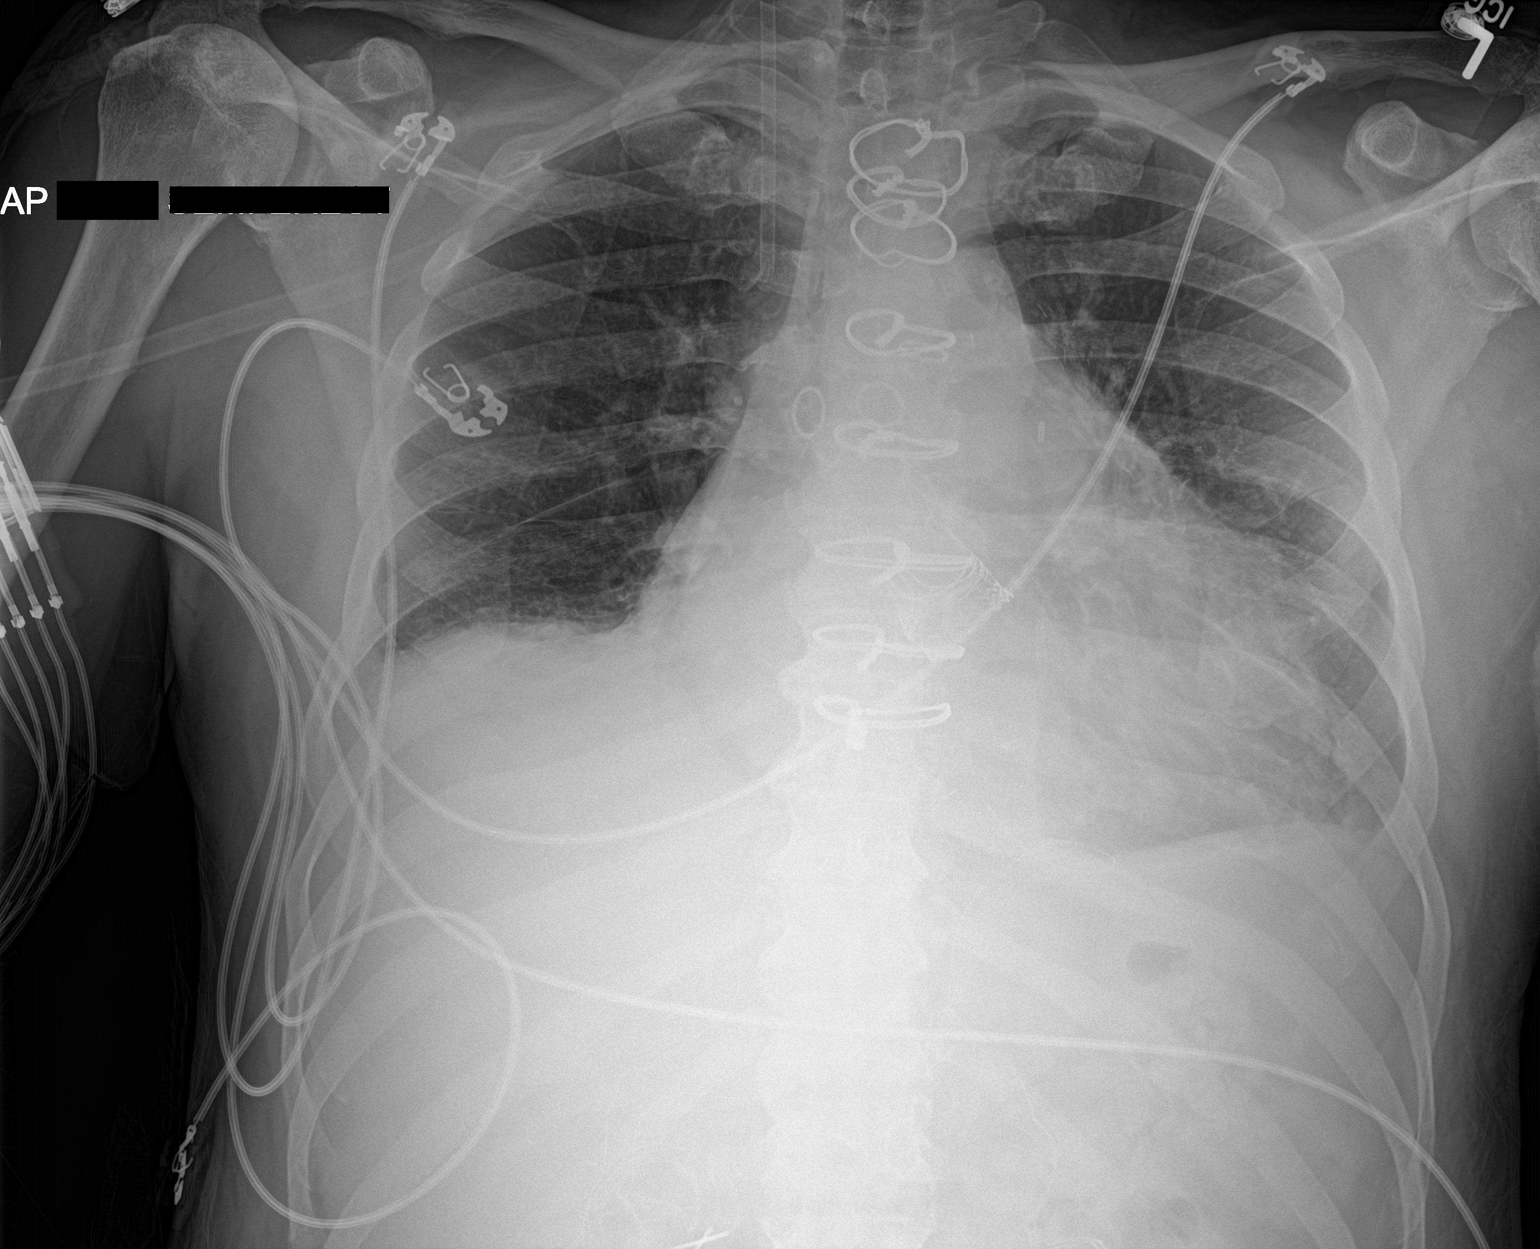

[1 of 1 positions shown; findings below may reference images not displayed]

FINDINGS: Cardiomegaly and aortic valve replacement/ CABG changes again noted.

Mediastinal and thoracostomy tubes have been removed. There is no
evidence of pneumothorax.

A right IJ central venous catheter sheath remains.

Improved left basilar aeration noted. Right basilar atelectasis is
unchanged.
IMPRESSION: Mediastinal and thoracostomy tube removal -no evidence of
pneumothorax.

Improved left basilar aeration.

## 2019-10-18 IMAGING — DX DG CHEST 1V PORT
1 series · 1 of 1 positions shown · non-contrast
Comparison: Portable chest x-ray October 20, 2017

CLINICAL DATA: Status post CABG and aortic valve replacement on
October 17, 2017

EXAM:
PORTABLE CHEST 1 VIEW

[chest]
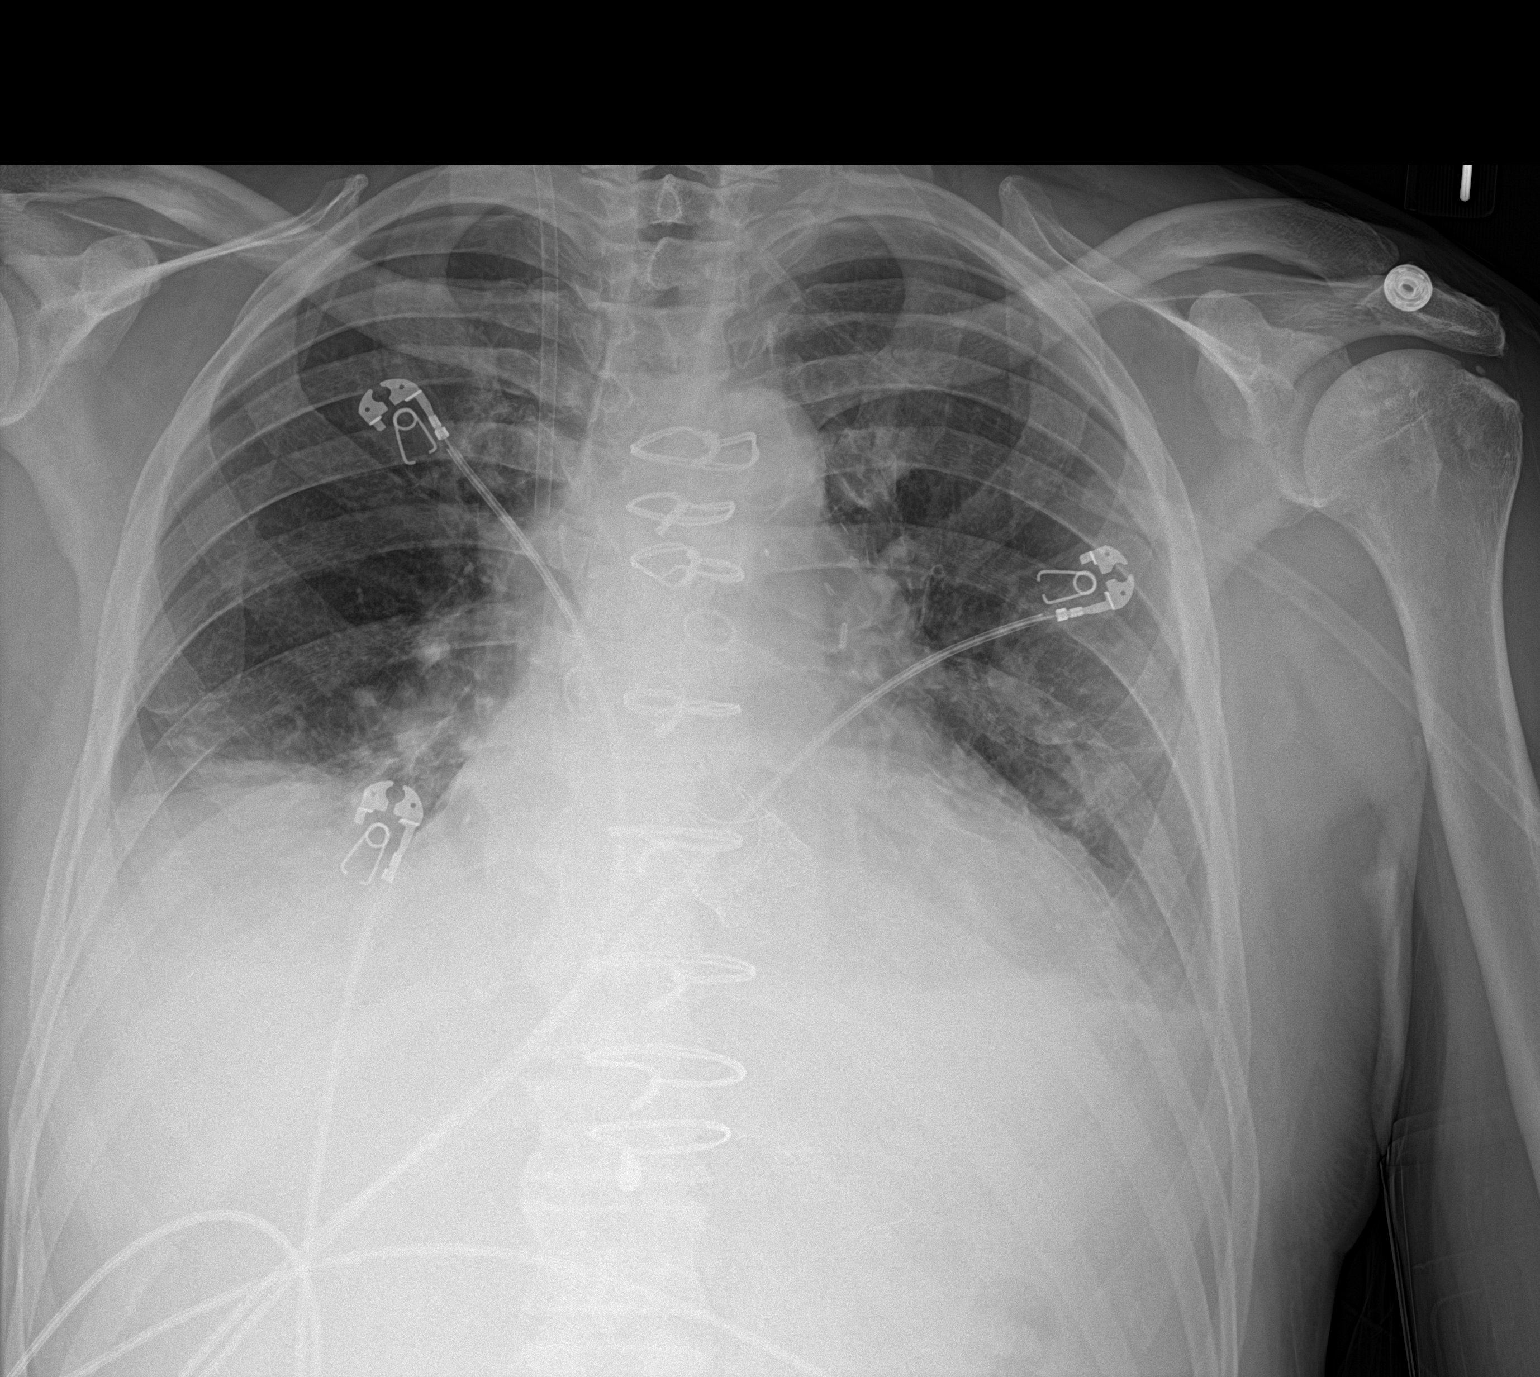

[1 of 1 positions shown; findings below may reference images not displayed]

FINDINGS: The lungs remain mildly hypoinflated. There is no pneumothorax.
There is persistent bibasilar subsegmental atelectasis greatest on
the right. Probable trace pleural effusions. The cardiac silhouette
remains enlarged. The pulmonary vascularity is not clearly engorged.
The right internal jugular Cordis sheath tip projects over the
proximal SVC. The aortic valve cage is in stable position. The
sternal wires are intact. There is calcification in the wall of the
aortic arch.
IMPRESSION: Stable appearance of the chest. Bibasilar subsegmental atelectasis
with probable trace pleural effusions. Stable cardiomegaly without
pulmonary vascular congestion.

Thoracic aortic atherosclerosis.

## 2019-12-07 IMAGING — DX DG CHEST 2V
2 series · 2 of 2 positions shown · non-contrast
Comparison: 11/12/2017.

CLINICAL DATA: CABG.  AVR.  Fever chills.

EXAM:
CHEST  2 VIEW

[dg chest 2 view (1 of 2)]
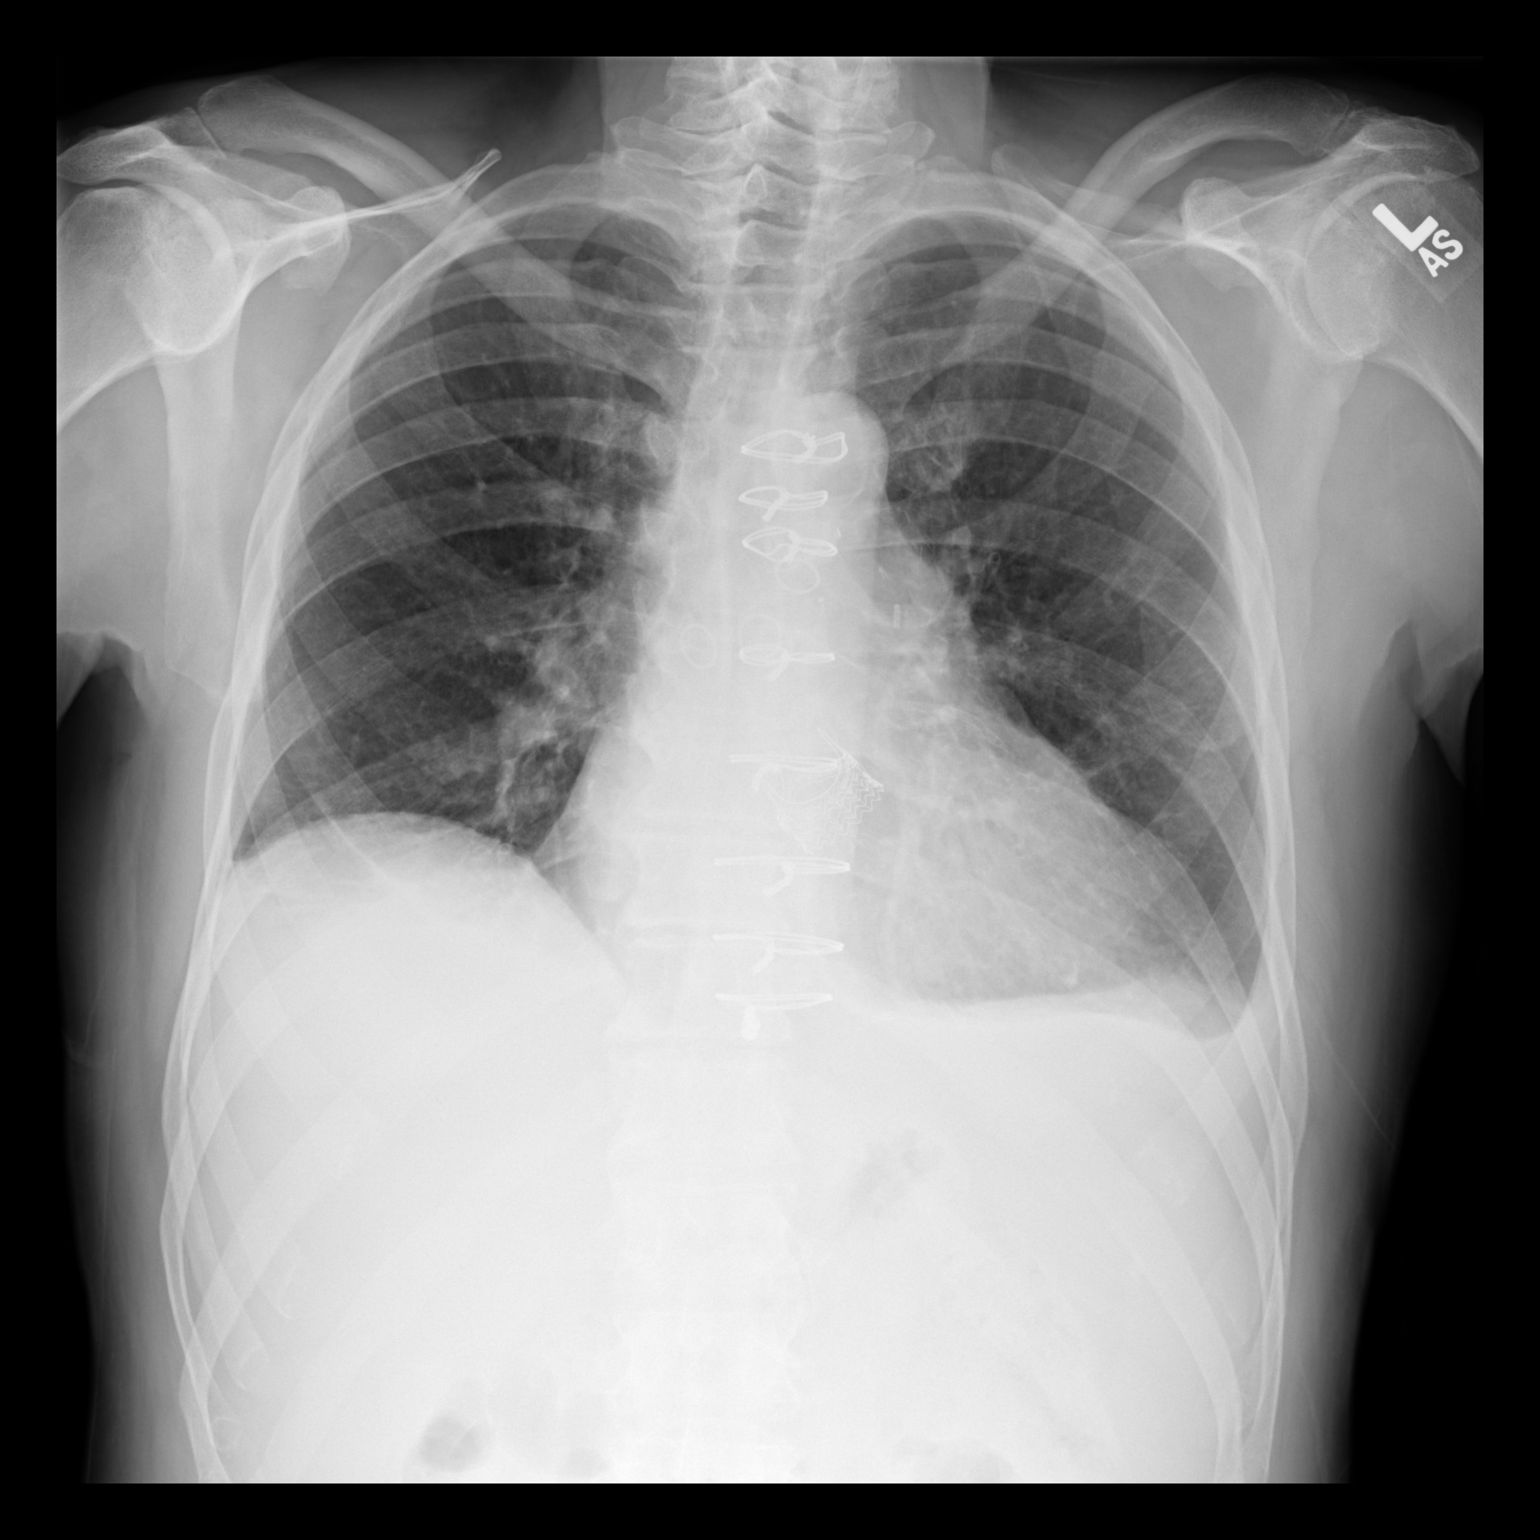

[dg chest 2 view (2 of 2)]
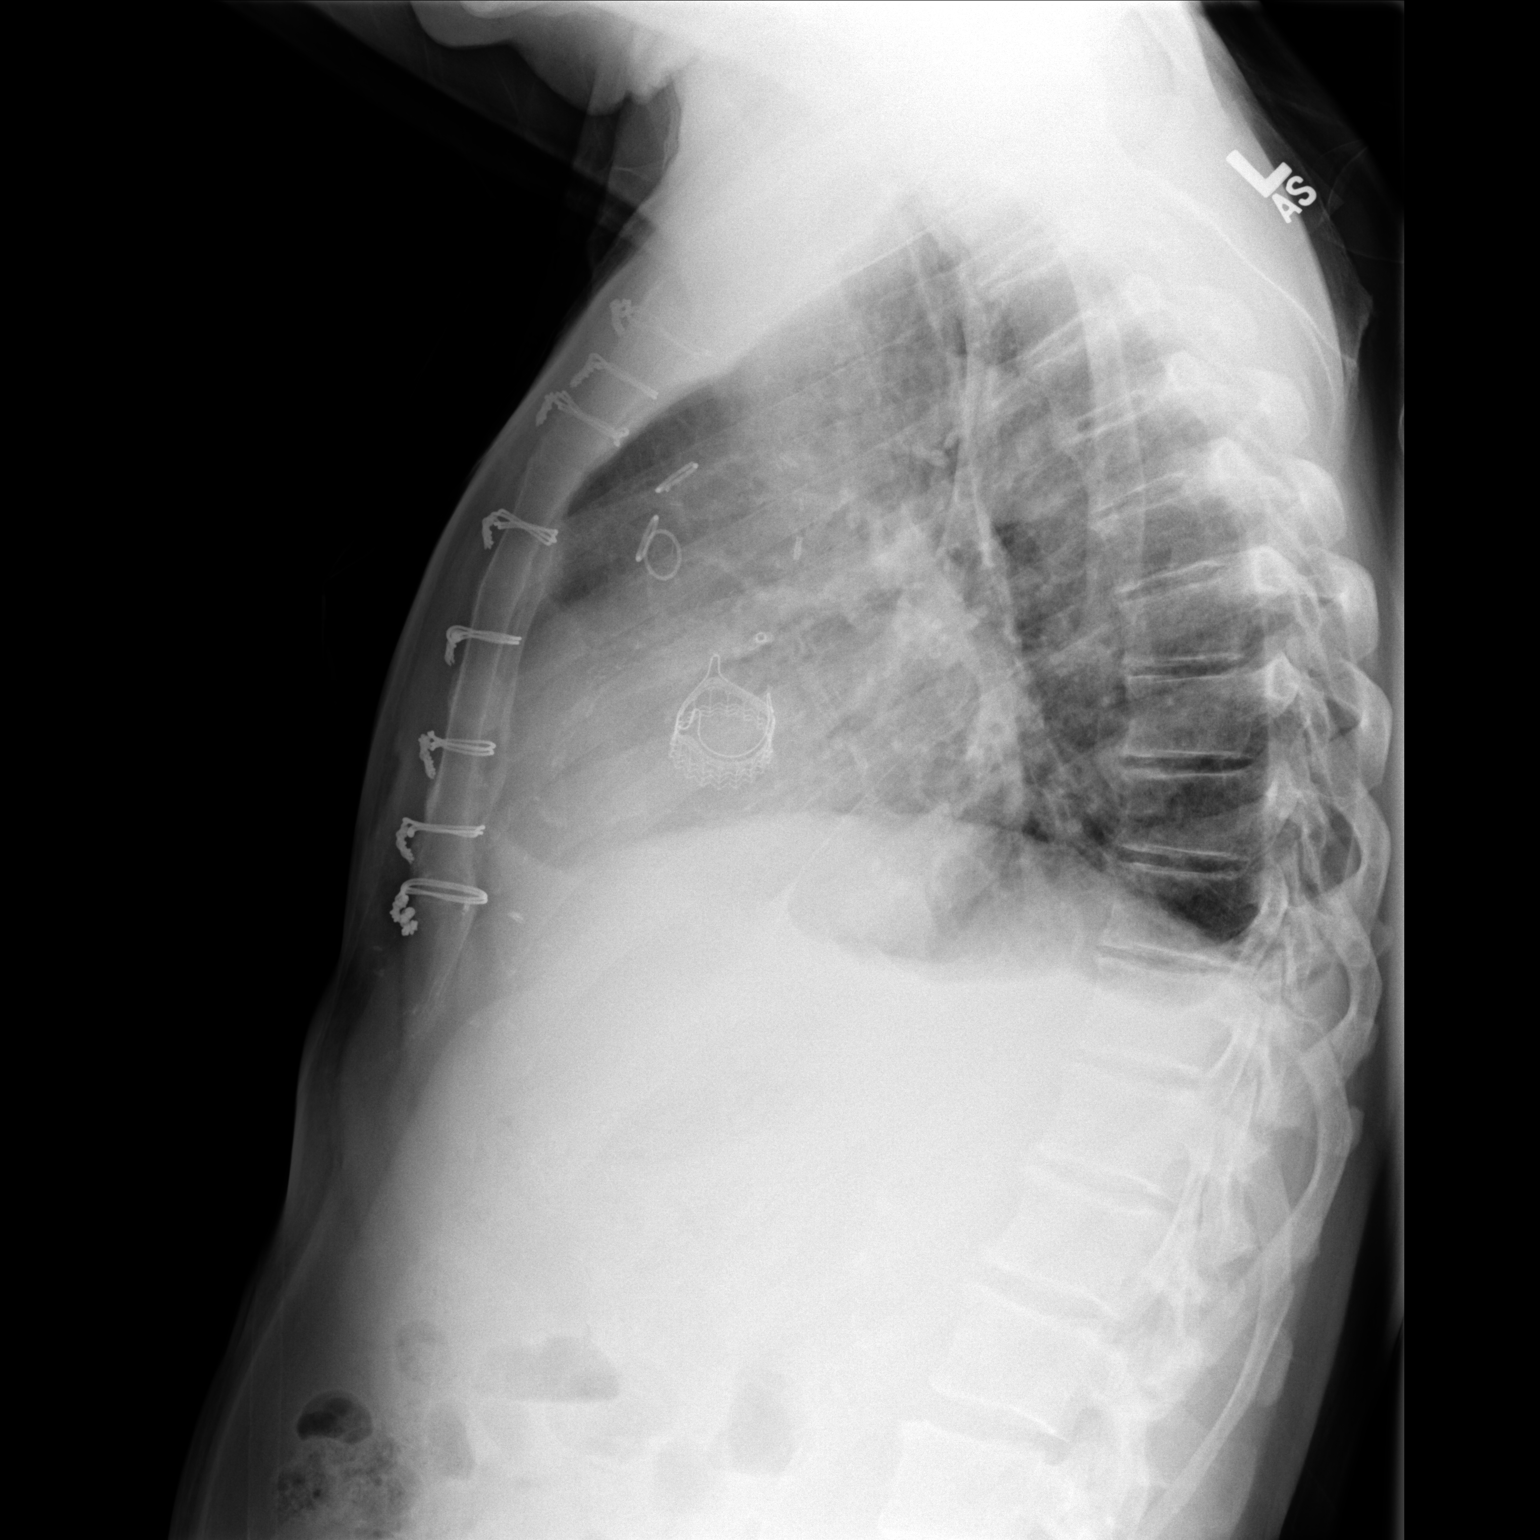

[2 of 2 positions shown; findings below may reference images not displayed]

FINDINGS: Prior CABG and cardiac valve replacement. Stable cardiomegaly. No
pulmonary venous congestion. Stable mild bibasilar atelectasis and
bilateral small pleural effusions. No pneumothorax. Degenerative
changes scoliosis thoracic spine.
IMPRESSION: 1. Stable mild bibasilar atelectasis and bilateral pleural
effusions.

2. Prior CABG and cardiac valve replacement. Stable cardiomegaly. No
pulmonary venous congestion.
